# Patient Record
Sex: Male | Born: 1985 | Race: White | Hispanic: No | Marital: Single
Health system: Southern US, Academic
[De-identification: ages and names within clinical notes are randomized; demographics above are authoritative.]

## PROBLEM LIST (undated history)

## (undated) ENCOUNTER — Ambulatory Visit

## (undated) ENCOUNTER — Telehealth

## (undated) ENCOUNTER — Telehealth
Attending: Pharmacist Clinician (PhC)/ Clinical Pharmacy Specialist | Primary: Pharmacist Clinician (PhC)/ Clinical Pharmacy Specialist

## (undated) ENCOUNTER — Telehealth
Attending: Student in an Organized Health Care Education/Training Program | Primary: Student in an Organized Health Care Education/Training Program

## (undated) ENCOUNTER — Encounter

## (undated) ENCOUNTER — Encounter
Attending: Student in an Organized Health Care Education/Training Program | Primary: Student in an Organized Health Care Education/Training Program

## (undated) ENCOUNTER — Ambulatory Visit: Attending: Radiation Oncology | Primary: Radiation Oncology

## (undated) ENCOUNTER — Ambulatory Visit
Attending: Student in an Organized Health Care Education/Training Program | Primary: Student in an Organized Health Care Education/Training Program

## (undated) ENCOUNTER — Encounter
Attending: Pharmacist Clinician (PhC)/ Clinical Pharmacy Specialist | Primary: Pharmacist Clinician (PhC)/ Clinical Pharmacy Specialist

## (undated) ENCOUNTER — Encounter: Payer: PRIVATE HEALTH INSURANCE | Attending: Psychiatry | Primary: Psychiatry

## (undated) ENCOUNTER — Ambulatory Visit
Attending: Pharmacist Clinician (PhC)/ Clinical Pharmacy Specialist | Primary: Pharmacist Clinician (PhC)/ Clinical Pharmacy Specialist

## (undated) ENCOUNTER — Encounter: Attending: Psychiatry | Primary: Psychiatry

## (undated) ENCOUNTER — Encounter: Attending: Radiation Oncology | Primary: Radiation Oncology

## (undated) ENCOUNTER — Inpatient Hospital Stay

## (undated) ENCOUNTER — Non-Acute Institutional Stay: Payer: PRIVATE HEALTH INSURANCE

## (undated) ENCOUNTER — Telehealth: Attending: Internal Medicine | Primary: Internal Medicine

## (undated) ENCOUNTER — Encounter: Attending: Internal Medicine | Primary: Internal Medicine

## (undated) ENCOUNTER — Ambulatory Visit: Attending: Neurological Surgery | Primary: Neurological Surgery

## (undated) ENCOUNTER — Encounter
Payer: PRIVATE HEALTH INSURANCE | Attending: Student in an Organized Health Care Education/Training Program | Primary: Student in an Organized Health Care Education/Training Program

## (undated) ENCOUNTER — Ambulatory Visit: Payer: PRIVATE HEALTH INSURANCE | Attending: Clinical | Primary: Clinical

## (undated) ENCOUNTER — Encounter: Attending: Neuromusculoskeletal Medicine & OMM | Primary: Neuromusculoskeletal Medicine & OMM

## (undated) ENCOUNTER — Encounter: Payer: PRIVATE HEALTH INSURANCE | Attending: Critical Care Medicine | Primary: Critical Care Medicine

## (undated) ENCOUNTER — Ambulatory Visit: Payer: PRIVATE HEALTH INSURANCE | Attending: Critical Care Medicine | Primary: Critical Care Medicine

## (undated) ENCOUNTER — Non-Acute Institutional Stay
Payer: PRIVATE HEALTH INSURANCE | Attending: Pharmacist Clinician (PhC)/ Clinical Pharmacy Specialist | Primary: Pharmacist Clinician (PhC)/ Clinical Pharmacy Specialist

## (undated) ENCOUNTER — Encounter: Payer: PRIVATE HEALTH INSURANCE | Attending: Radiation Oncology | Primary: Radiation Oncology

## (undated) ENCOUNTER — Encounter
Payer: MEDICAID | Attending: Student in an Organized Health Care Education/Training Program | Primary: Student in an Organized Health Care Education/Training Program

## (undated) ENCOUNTER — Telehealth: Attending: Radiation Oncology | Primary: Radiation Oncology

## (undated) ENCOUNTER — Ambulatory Visit: Attending: Psychiatry | Primary: Psychiatry

## (undated) DIAGNOSIS — S3992XA Unspecified injury of lower back, initial encounter: Secondary | ICD-10-CM

## (undated) MED ORDER — OXYCODONE 5 MG CAPSULE: ORAL | 0.00000 days | PRN

## (undated) MED ORDER — DRONABINOL 10 MG CAPSULE: capsule | 0 refills | 0 days

---

## 2011-05-16 ENCOUNTER — Emergency Department (HOSPITAL_COMMUNITY)
Admission: EM | Admit: 2011-05-16 | Discharge: 2011-05-16 | Disposition: A | Discharge status: Home / Self Care | Payer: Self-pay | Attending: Emergency Medicine | Admitting: Emergency Medicine

## 2011-05-16 ENCOUNTER — Emergency Department (HOSPITAL_COMMUNITY): Payer: Self-pay

## 2011-05-16 DIAGNOSIS — S20229A Contusion of unspecified back wall of thorax, initial encounter: Secondary | ICD-10-CM | POA: Insufficient documentation

## 2011-05-16 DIAGNOSIS — Y9229 Other specified public building as the place of occurrence of the external cause: Secondary | ICD-10-CM | POA: Insufficient documentation

## 2011-05-16 DIAGNOSIS — M545 Low back pain, unspecified: Secondary | ICD-10-CM | POA: Insufficient documentation

## 2011-05-16 DIAGNOSIS — M546 Pain in thoracic spine: Secondary | ICD-10-CM | POA: Insufficient documentation

## 2011-05-16 DIAGNOSIS — IMO0002 Reserved for concepts with insufficient information to code with codable children: Secondary | ICD-10-CM | POA: Insufficient documentation

## 2011-05-16 DIAGNOSIS — J45909 Unspecified asthma, uncomplicated: Secondary | ICD-10-CM | POA: Insufficient documentation

## 2012-12-22 ENCOUNTER — Emergency Department (HOSPITAL_COMMUNITY)
Admission: EM | Admit: 2012-12-22 | Discharge: 2012-12-22 | Disposition: A | Discharge status: Home / Self Care | Payer: No Typology Code available for payment source | Attending: Emergency Medicine | Admitting: Emergency Medicine

## 2012-12-22 ENCOUNTER — Emergency Department (HOSPITAL_COMMUNITY): Payer: No Typology Code available for payment source

## 2012-12-22 ENCOUNTER — Encounter (HOSPITAL_COMMUNITY): Payer: Self-pay | Admitting: Adult Health

## 2012-12-22 DIAGNOSIS — Y939 Activity, unspecified: Secondary | ICD-10-CM | POA: Insufficient documentation

## 2012-12-22 DIAGNOSIS — Y92410 Unspecified street and highway as the place of occurrence of the external cause: Secondary | ICD-10-CM | POA: Insufficient documentation

## 2012-12-22 DIAGNOSIS — S199XXA Unspecified injury of neck, initial encounter: Secondary | ICD-10-CM | POA: Insufficient documentation

## 2012-12-22 DIAGNOSIS — IMO0002 Reserved for concepts with insufficient information to code with codable children: Secondary | ICD-10-CM | POA: Insufficient documentation

## 2012-12-22 DIAGNOSIS — F17200 Nicotine dependence, unspecified, uncomplicated: Secondary | ICD-10-CM | POA: Insufficient documentation

## 2012-12-22 DIAGNOSIS — M549 Dorsalgia, unspecified: Secondary | ICD-10-CM

## 2012-12-22 DIAGNOSIS — S0993XA Unspecified injury of face, initial encounter: Secondary | ICD-10-CM | POA: Insufficient documentation

## 2012-12-22 HISTORY — DX: Unspecified injury of lower back, initial encounter: S39.92XA

## 2012-12-22 MED ORDER — HYDROCODONE-ACETAMINOPHEN 5-325 MG PO TABS: 1.0000 | ORAL_TABLET | ORAL | Status: DC | PRN

## 2012-12-22 MED ORDER — OXYCODONE-ACETAMINOPHEN 5-325 MG PO TABS
1.0000 | ORAL_TABLET | Freq: Once | ORAL | Status: AC
  Administered 2012-12-22: 1 via ORAL
  Filled 2012-12-22: qty 1

## 2012-12-22 MED ORDER — CYCLOBENZAPRINE HCL 10 MG PO TABS: 10.0000 mg | ORAL_TABLET | Freq: Two times a day (BID) | ORAL | Status: DC | PRN
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
## 2012-12-22 NOTE — ED Provider Notes (Signed)
Medical screening examination/treatment/procedure(s) were performed by non-physician practitioner and as supervising physician I was immediately available for consultation/collaboration.   Zeda Gangwer, MD 12/22/12 2346 
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
## 2012-12-22 NOTE — ED Notes (Signed)
Patient transported to X-ray 

## 2012-12-22 NOTE — ED Notes (Signed)
Pt alert and oriented, with steady gait at time of discharge. Pt given discharge papers and papers explained. All questions answered and pt walked to discharge.  

## 2012-12-26 ENCOUNTER — Emergency Department (HOSPITAL_COMMUNITY): Payer: No Typology Code available for payment source

## 2012-12-26 ENCOUNTER — Encounter (HOSPITAL_COMMUNITY): Payer: Self-pay | Admitting: Family Medicine

## 2012-12-26 ENCOUNTER — Emergency Department (HOSPITAL_COMMUNITY)
Admission: EM | Admit: 2012-12-26 | Discharge: 2012-12-26 | Disposition: A | Discharge status: Home / Self Care | Payer: No Typology Code available for payment source | Attending: Emergency Medicine | Admitting: Emergency Medicine

## 2012-12-26 DIAGNOSIS — Y939 Activity, unspecified: Secondary | ICD-10-CM | POA: Insufficient documentation

## 2012-12-26 DIAGNOSIS — S0003XA Contusion of scalp, initial encounter: Secondary | ICD-10-CM | POA: Insufficient documentation

## 2012-12-26 DIAGNOSIS — S8000XA Contusion of unspecified knee, initial encounter: Secondary | ICD-10-CM | POA: Insufficient documentation

## 2012-12-26 DIAGNOSIS — M549 Dorsalgia, unspecified: Secondary | ICD-10-CM

## 2012-12-26 DIAGNOSIS — S0093XA Contusion of unspecified part of head, initial encounter: Secondary | ICD-10-CM

## 2012-12-26 DIAGNOSIS — Y92410 Unspecified street and highway as the place of occurrence of the external cause: Secondary | ICD-10-CM | POA: Insufficient documentation

## 2012-12-26 DIAGNOSIS — S0083XA Contusion of other part of head, initial encounter: Secondary | ICD-10-CM | POA: Insufficient documentation

## 2012-12-26 DIAGNOSIS — F17200 Nicotine dependence, unspecified, uncomplicated: Secondary | ICD-10-CM | POA: Insufficient documentation

## 2012-12-26 DIAGNOSIS — R51 Headache: Secondary | ICD-10-CM

## 2012-12-26 DIAGNOSIS — Z87828 Personal history of other (healed) physical injury and trauma: Secondary | ICD-10-CM | POA: Insufficient documentation

## 2012-12-26 MED ORDER — HYDROCODONE-ACETAMINOPHEN 5-325 MG PO TABS: 2.0000 | ORAL_TABLET | Freq: Four times a day (QID) | ORAL | Status: DC | PRN

## 2012-12-26 MED ORDER — IBUPROFEN 600 MG PO TABS: 600.0000 mg | ORAL_TABLET | Freq: Three times a day (TID) | ORAL | Status: DC | PRN

## 2012-12-26 MED ORDER — IBUPROFEN 400 MG PO TABS
600.0000 mg | ORAL_TABLET | Freq: Once | ORAL | Status: AC
  Administered 2012-12-26: 600 mg via ORAL
  Filled 2012-12-26: qty 1

## 2012-12-26 MED ORDER — OXYCODONE-ACETAMINOPHEN 5-325 MG PO TABS
1.0000 | ORAL_TABLET | Freq: Once | ORAL | Status: AC
  Administered 2012-12-26: 1 via ORAL
  Filled 2012-12-26: qty 1
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
## 2012-12-26 NOTE — ED Notes (Signed)
Pt states he has been taking ibuprofen, vicodin, and flexeril for pain but has had no relief.
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
## 2013-07-31 ENCOUNTER — Emergency Department (HOSPITAL_COMMUNITY)
Admission: EM | Admit: 2013-07-31 | Discharge: 2013-07-31 | Disposition: A | Discharge status: Home / Self Care | Payer: Self-pay | Attending: Emergency Medicine | Admitting: Emergency Medicine

## 2013-07-31 ENCOUNTER — Encounter (HOSPITAL_COMMUNITY): Payer: Self-pay | Admitting: Nurse Practitioner

## 2013-07-31 DIAGNOSIS — Z79899 Other long term (current) drug therapy: Secondary | ICD-10-CM | POA: Insufficient documentation

## 2013-07-31 DIAGNOSIS — Z8739 Personal history of other diseases of the musculoskeletal system and connective tissue: Secondary | ICD-10-CM | POA: Insufficient documentation

## 2013-07-31 DIAGNOSIS — J039 Acute tonsillitis, unspecified: Secondary | ICD-10-CM | POA: Insufficient documentation

## 2013-07-31 DIAGNOSIS — R599 Enlarged lymph nodes, unspecified: Secondary | ICD-10-CM | POA: Insufficient documentation

## 2013-07-31 DIAGNOSIS — Z87891 Personal history of nicotine dependence: Secondary | ICD-10-CM | POA: Insufficient documentation

## 2013-07-31 LAB — RAPID STREP SCREEN (MED CTR MEBANE ONLY): Streptococcus, Group A Screen (Direct): NEGATIVE

## 2013-07-31 LAB — MONONUCLEOSIS SCREEN: Mono Screen: NEGATIVE

## 2013-07-31 MED ORDER — ACETAMINOPHEN-CODEINE 120-12 MG/5ML PO SOLN
10.0000 mL | Freq: Once | ORAL | Status: AC
Start: 2013-07-31 — End: 2013-07-31
  Administered 2013-07-31: 10 mL via ORAL
  Filled 2013-07-31: qty 10

## 2013-07-31 MED ORDER — ACETAMINOPHEN-CODEINE 120-12 MG/5ML PO SOLN: 10.0000 mL | Freq: Once | ORAL | Status: AC

## 2013-07-31 MED ORDER — PENICILLIN G BENZATHINE 1200000 UNIT/2ML IM SUSP
1.2000 10*6.[IU] | Freq: Once | INTRAMUSCULAR | Status: AC
  Administered 2013-07-31: 1.2 10*6.[IU] via INTRAMUSCULAR
  Filled 2013-07-31: qty 2

## 2013-07-31 MED ORDER — IBUPROFEN 400 MG PO TABS
800.0000 mg | ORAL_TABLET | Freq: Once | ORAL | Status: AC
  Administered 2013-07-31: 800 mg via ORAL
  Filled 2013-07-31: qty 2

## 2013-07-31 NOTE — ED Notes (Signed)
Notified PA of vital signs.

## 2013-07-31 NOTE — ED Provider Notes (Signed)
  CSN: [id number]     Arrival date & time 07/31/13  1116 History     First MD Initiated Contact with Patient 07/31/13 1120     Chief Complaint  Patient presents with  . Sore Throat   (Consider location/radiation/quality/duration/timing/severity/associated sxs/prior Treatment) HPI Patient presents to the emergency department with a two-day history of sore throat.  Patient, states, that he noticed, sore throat, 2 days, ago, with redness, swelling, and plaques in his throat.  Patient, states he is also having lymph node, pain and swelling in his neck.  Patient denies chest pain, shortness of breath, difficulty breathing, difficulty swallowing, headache, blurred vision, numbness, weakness, dizziness, syncope, fever, or blurred vision.  Patient, states, that he has had sweats, but no documented fevers.  Patient, states he did not take any medications prior to arrival.  Patient, states swallowing, makes his pain, worse Past Medical History  Diagnosis Date  . Back injury    History reviewed. No pertinent past surgical history. History reviewed. No pertinent family history. History  Substance Use Topics  . Smoking status: Former Games developer  . Smokeless tobacco: Not on file  . Alcohol Use: No    Review of Systems All other systems negative except as documented in the HPI. All pertinent positives and negatives as reviewed in the HPI. Allergies  Review of patient's allergies indicates no known allergies.  Home Medications   Current Outpatient Rx  Name  Route  Sig  Dispense  Refill  . cyclobenzaprine (FLEXERIL) 10 MG tablet   Oral   Take 1 tablet (10 mg total) by mouth 2 (two) times daily as needed for muscle spasms.   20 tablet   0   . HYDROcodone-acetaminophen (NORCO/VICODIN) 5-325 MG per tablet   Oral   Take 1-2 tablets by mouth every 4 (four) hours as needed for pain.   12 tablet   0   . HYDROcodone-acetaminophen (NORCO/VICODIN) 5-325 MG per tablet   Oral   Take 2 tablets by mouth  every 6 (six) hours as needed for pain.   15 tablet   0   . ibuprofen (ADVIL,MOTRIN) 600 MG tablet   Oral   Take 1 tablet (600 mg total) by mouth every 8 (eight) hours as needed for pain. Take with food.   20 tablet   0    BP 138/86  Pulse 108  Temp(Src) 97.8 F (36.6 C) (Oral)  Resp 16  SpO2 95% Physical Exam  Nursing note and vitals reviewed. Constitutional: He appears well-developed and well-nourished. No distress.  HENT:  Head: Normocephalic and atraumatic.  Mouth/Throat: Mucous membranes are normal. No edematous. Oropharyngeal exudate, posterior oropharyngeal edema and posterior oropharyngeal erythema present. No tonsillar abscesses.  Eyes: Pupils are equal, round, and reactive to light.  Cardiovascular: Normal rate, regular rhythm and normal heart sounds.  Exam reveals no gallop and no friction rub.   No murmur heard. Pulmonary/Chest: Effort normal and breath sounds normal. No respiratory distress.    ED Course   Procedures (including critical care time)  Labs Reviewed  RAPID STREP SCREEN   patient has a negative mono, and negative, strep, we'll treat for an infectious process based on the appearance of his throat.  MDM    Carlyle Dolly, PA-C 07/31/13 1323

## 2013-07-31 NOTE — ED Notes (Signed)
Pt with sore throat, swelling, redness and pus to back of throat x 2 days. Breathing easily

## 2013-08-01 ENCOUNTER — Telehealth (HOSPITAL_COMMUNITY): Payer: Self-pay | Admitting: Emergency Medicine

## 2013-08-01 NOTE — ED Provider Notes (Signed)
Medical screening examination/treatment/procedure(s) were performed by non-physician practitioner and as supervising physician I was immediately available for consultation/collaboration.   Kahmari Herard E Decker Cogdell, MD 08/01/13 0916 

## 2013-08-02 ENCOUNTER — Telehealth (HOSPITAL_COMMUNITY): Payer: Self-pay | Admitting: *Deleted

## 2013-08-02 LAB — CULTURE, GROUP A STREP

## 2013-08-03 NOTE — ED Notes (Signed)
Adequately treated-per Emily West 

## 2015-01-27 ENCOUNTER — Emergency Department (HOSPITAL_COMMUNITY)
Admission: EM | Admit: 2015-01-27 | Discharge: 2015-01-27 | Disposition: A | Discharge status: Home / Self Care | Payer: Self-pay | Attending: Emergency Medicine | Admitting: Emergency Medicine

## 2015-01-27 ENCOUNTER — Emergency Department (HOSPITAL_COMMUNITY): Payer: Self-pay

## 2015-01-27 ENCOUNTER — Encounter (HOSPITAL_COMMUNITY): Payer: Self-pay | Admitting: Emergency Medicine

## 2015-01-27 DIAGNOSIS — R229 Localized swelling, mass and lump, unspecified: Secondary | ICD-10-CM

## 2015-01-27 DIAGNOSIS — IMO0002 Reserved for concepts with insufficient information to code with codable children: Secondary | ICD-10-CM

## 2015-01-27 DIAGNOSIS — I861 Scrotal varices: Secondary | ICD-10-CM | POA: Insufficient documentation

## 2015-01-27 DIAGNOSIS — N503 Cyst of epididymis: Secondary | ICD-10-CM | POA: Insufficient documentation

## 2015-01-27 DIAGNOSIS — N508 Other specified disorders of male genital organs: Secondary | ICD-10-CM | POA: Insufficient documentation

## 2015-01-27 DIAGNOSIS — Z87891 Personal history of nicotine dependence: Secondary | ICD-10-CM | POA: Insufficient documentation

## 2015-01-27 LAB — URINALYSIS, ROUTINE W REFLEX MICROSCOPIC
BILIRUBIN URINE: NEGATIVE
Glucose, UA: NEGATIVE mg/dL
HGB URINE DIPSTICK: NEGATIVE
KETONES UR: NEGATIVE mg/dL
Nitrite: NEGATIVE
PROTEIN: NEGATIVE mg/dL
Specific Gravity, Urine: 1.014 (ref 1.005–1.030)
UROBILINOGEN UA: 1 mg/dL (ref 0.0–1.0)
pH: 6.5 (ref 5.0–8.0)

## 2015-01-27 LAB — URINE MICROSCOPIC-ADD ON

## 2015-01-27 MED ORDER — DOXYCYCLINE HYCLATE 100 MG PO CAPS: 100.0000 mg | ORAL_CAPSULE | Freq: Two times a day (BID) | ORAL | Status: AC

## 2015-01-27 NOTE — ED Notes (Signed)
Pt noted swollen nodule in l/testicle last night. Denies pain

## 2015-01-27 NOTE — ED Provider Notes (Signed)
CSN: [id number]     Arrival date & time 01/27/15  1008 History   First MD Initiated Contact with Patient 01/27/15 1030     Chief Complaint  Patient presents with  . Groin Swelling    1 day hx of nonpainful growth on r/testicle     (Consider location/radiation/quality/duration/timing/severity/associated sxs/prior Treatment) Patient is a 29 y.o. male presenting with testicular pain. The history is provided by the patient. No language interpreter was used.  Testicle Pain This is a new problem. The current episode started today. The problem occurs constantly. The problem has been gradually worsening. Associated symptoms include nausea. Pertinent negatives include no fever. Nothing aggravates the symptoms. He has tried nothing for the symptoms. The treatment provided moderate relief.  Pt reports he felt a knot in his right testicle.  Pt reports he noticed when he was in the shower  Past Medical History  Diagnosis Date  . Back injury    History reviewed. No pertinent past surgical history. History reviewed. No pertinent family history. History  Substance Use Topics  . Smoking status: Former Games developer  . Smokeless tobacco: Not on file  . Alcohol Use: Yes    Review of Systems  Constitutional: Negative for fever.  Gastrointestinal: Positive for nausea.  Genitourinary: Positive for testicular pain.  All other systems reviewed and are negative.     Allergies  Review of patient's allergies indicates no known allergies.  Home Medications   Prior to Admission medications   Medication Sig Start Date End Date Taking? Authorizing Provider  ibuprofen (ADVIL,MOTRIN) 200 MG tablet Take 600-800 mg by mouth daily as needed for pain.   Yes Historical Provider, MD  acetaminophen-codeine 120-12 MG/5ML solution Take 10 mLs by mouth once. Patient not taking: Reported on 01/27/2015 07/31/13   Jamesetta Orleans Lawyer, PA-C   BP 146/90 mmHg  Pulse 93  Temp(Src) 98.2 F (36.8 C) (Oral)  Resp 16  SpO2  100% Physical Exam  Constitutional: He is oriented to person, place, and time. He appears well-developed and well-nourished.  HENT:  Head: Normocephalic and atraumatic.  Eyes: EOM are normal. Pupils are equal, round, and reactive to light.  Neck: Normal range of motion.  Cardiovascular: Normal rate.   Pulmonary/Chest: Effort normal.  Abdominal: Soft. He exhibits no distension.  Musculoskeletal: Normal range of motion.  Neurological: He is alert and oriented to person, place, and time.  Skin: Skin is warm.  Psychiatric: He has a normal mood and affect.  Nursing note and vitals reviewed.   ED Course  Procedures (including critical care time) Labs Review Labs Reviewed  URINALYSIS, ROUTINE W REFLEX MICROSCOPIC - Abnormal; Notable for the following:    Leukocytes, UA TRACE (*)    All other components within normal limits  URINE MICROSCOPIC-ADD ON    Imaging Review US Scrotum  01/27/2015   CLINICAL DATA:  Right scrotal/testicular region pain. Questionable right scrotal lump.  EXAM: SCROTAL ULTRASOUND  DOPPLER ULTRASOUND OF THE TESTICLES  TECHNIQUE: Complete ultrasound examination of the testicles, epididymis, and other scrotal structures was performed. Color and spectral Doppler ultrasound were also utilized to evaluate blood flow to the testicles.  COMPARISON:  None.  FINDINGS: Right testicle  Measurements: 4.8 cm x 2.2 cm x 3.3 cm. No mass or microlithiasis visualized.  Left testicle  Measurements: 5.3 cm x 2.1 cm x 3.0 cm. No mass or microlithiasis visualized.  Right epididymis:  7 mm epididymal head cyst.  No other abnormality.  Left epididymis: Several small epididymal cysts. No other abnormalities.  Hydrocele:  No significant hydrocele.  Varicocele:  Positive left varicocele.  Pulsed Doppler interrogation of both testes demonstrates low resistance arterial and venous waveforms bilaterally.  IMPRESSION: 1. Normal testicles.  Specifically, no mass or portion. 2. Small epididymal cysts. 3.  Left-sided varicocele. 4. No other abnormalities. No specific abnormality is seen to correspond to the reported palpable abnormality.   Electronically Signed   By: Amie Portlandavid  Ormond M.D.   On: 01/27/2015 12:43   [place] Art/ven Flow Abd Pelv Doppler  01/27/2015   CLINICAL DATA:  Right scrotal/testicular region pain. Questionable right scrotal lump.  EXAM: SCROTAL ULTRASOUND  DOPPLER ULTRASOUND OF THE TESTICLES  TECHNIQUE: Complete ultrasound examination of the testicles, epididymis, and other scrotal structures was performed. Color and spectral Doppler ultrasound were also utilized to evaluate blood flow to the testicles.  COMPARISON:  None.  FINDINGS: Right testicle  Measurements: 4.8 cm x 2.2 cm x 3.3 cm. No mass or microlithiasis visualized.  Left testicle  Measurements: 5.3 cm x 2.1 cm x 3.0 cm. No mass or microlithiasis visualized.  Right epididymis:  7 mm epididymal head cyst.  No other abnormality.  Left epididymis: Several small epididymal cysts. No other abnormalities.  Hydrocele:  No significant hydrocele.  Varicocele:  Positive left varicocele.  Pulsed Doppler interrogation of both testes demonstrates low resistance arterial and venous waveforms bilaterally.  IMPRESSION: 1. Normal testicles.  Specifically, no mass or portion. 2. Small epididymal cysts. 3. Left-sided varicocele. 4. No other abnormalities. No specific abnormality is seen to correspond to the reported palpable abnormality.   Electronically Signed   By: Amie Portlandavid  Ormond M.D.   On: 01/27/2015 12:43     EKG Interpretation None      MDM  Ultrasound scrotum 7 mm cyst. Left varicocele  I advised pt of results   Final diagnoses:  Mass  Left varicocele  Epididymal cyst    Pt counseled on follow up.       Lonia SkinnerLeslie K SummertownSofia, PA-C 01/27/15 1324  Doug SouSam Jacubowitz, MD 01/27/15 [phone]

## 2015-01-27 NOTE — Discharge Instructions (Signed)
Epidermal Cyst An epidermal cyst is sometimes called a sebaceous cyst, epidermal inclusion cyst, or infundibular cyst. These cysts usually contain a substance that looks "pasty" or "cheesy" and may have a bad smell. This substance is a protein called keratin. Epidermal cysts are usually found on the face, neck, or trunk. They may also occur in the vaginal area or other parts of the genitalia of both men and women. Epidermal cysts are usually small, painless, slow-growing bumps or lumps that move freely under the skin. It is important not to try to pop them. This may cause an infection and lead to tenderness and swelling. CAUSES  Epidermal cysts may be caused by a deep penetrating injury to the skin or a plugged hair follicle, often associated with acne. SYMPTOMS  Epidermal cysts can become inflamed and cause:  Redness.  Tenderness.  Increased temperature of the skin over the bumps or lumps.  Grayish-white, bad smelling material that drains from the bump or lump. DIAGNOSIS  Epidermal cysts are easily diagnosed by your caregiver during an exam. Rarely, a tissue sample (biopsy) may be taken to rule out other conditions that may resemble epidermal cysts. TREATMENT   Epidermal cysts often get better and disappear on their own. They are rarely ever cancerous.  If a cyst becomes infected, it may become inflamed and tender. This may require opening and draining the cyst. Treatment with antibiotics may be necessary. When the infection is gone, the cyst may be removed with minor surgery.  Small, inflamed cysts can often be treated with antibiotics or by injecting steroid medicines.  Sometimes, epidermal cysts become large and bothersome. If this happens, surgical removal in your caregiver's office may be necessary. HOME CARE INSTRUCTIONS  Only take over-the-counter or prescription medicines as directed by your caregiver.  Take your antibiotics as directed. Finish them even if you start to feel  better. SEEK MEDICAL CARE IF:   Your cyst becomes tender, red, or swollen.  Your condition is not improving or is getting worse.  You have any other questions or concerns. MAKE SURE YOU:  Understand these instructions.  Will watch your condition.  Will get help right away if you are not doing well or get worse. Document Released: 11/15/2004 Document Revised: 03/08/2012 Document Reviewed: 06/23/2011 [facility] Patient Information 2015 [place], [place]. This information is not intended to replace advice given to you by your health care provider. Make sure you discuss any questions you have with your health care provider. Varicocele A varicocele is a swelling of veins in the scrotum (the bag of skin that contains the testicles). It is most common in young men. It occurs most often on the left side. Small or painless varicoceles do not need treatment. Most often, this is not a serious problem, but further tests may be needed to confirm the diagnosis. Surgery may be needed if complications of varicoceles arise. Rarely, varicoceles can reoccur after surgery. CAUSES  The swelling is due to blood backing up in the vein that leads from the testicle back to the body. Blood backs up because the valves inside the vein are not working properly. Veins normally return blood to the heart. Valves in veins are supposed to be one-way valves. They should not allow blood to flow backwards. If the valves do not work well, blood can pool in a vein and make it swell. The same thing happens with varicose veins in the leg. SYMPTOMS  A varicocele most often causes no symptoms. When they occur, symptoms include:  Swelling on one side of the scrotum.  Swelling that is more obvious when standing up.  A lumpy feeling in the scrotum.  Heaviness on one side of the scrotum.  Dull ache in the scrotum, especially after exercise or prolonged standing or sitting.  Slower growth or reduced size of the testicle on the side  of the varicocele (in young males).  Problems with fertility can arise if the testicle does not grow normally. DIAGNOSIS  Varicocele is usually diagnosed by a physical exam. Sometimes ultrasonography is done. TREATMENT  Usually, varicoceles need no treatment. They are often routinely monitored on exam by your caregiver to ensure they do not slow the growth of the testicle on that side. Treatment may be needed if:  The varicocele is large.  There is a lot of pain.  The varicocele causes a decrease in the size of the testicle in a growing adolescent.  The other testicle is absent or not normal.  Varicoceles are found on both sides of the scrotum.  There is pain when exercising.  There are fertility problems. There are two types of treatment:  Surgery. The surgeon ties off the swollen veins. Surgery may be done with an incision in the skin or through a laparoscope. The surgery is usually done in an outpatient setting. Outpatient means there is no overnight stay in a hospital.  Embolization. A small tube is placed in a vein and guided into the swollen veins. X-rays are used to guide the small tube. Tiny metal coils or other blocking items are put through the tube. This blocks swollen veins and the flow of blood. This is usually done in an outpatient setting without the use of general anesthesia. HOME CARE INSTRUCTIONS  To decrease discomfort:  Wear supportive underwear.  Use an athletic supporter for sports.  Only take over-the-counter or prescription medicines for pain or discomfort as directed by your caregiver. SEEK MEDICAL CARE IF:   Pain is increasing.  Swelling does not decrease when lying down.  Testicle is smaller.  The testicle becomes enlarged, swollen, red, or painful. Document Released: 03/23/2001 Document Revised: 03/08/2012 Document Reviewed: 03/27/2010 [facility] Patient Information 2015 [place], [place]. This information is not intended to replace advice given to  you by your health care provider. Make sure you discuss any questions you have with your health care provider. Epididymitis Epididymitis is a swelling (inflammation) of the epididymis. The epididymis is a cord-like structure along the back part of the testicle. Epididymitis is usually, but not always, caused by infection. This is usually a sudden problem beginning with chills, fever and pain behind the scrotum and in the testicle. There may be swelling and redness of the testicle. DIAGNOSIS  Physical examination will reveal a tender, swollen epididymis. Sometimes, cultures are obtained from the urine or from prostate secretions to help find out if there is an infection or if the cause is a different problem. Sometimes, blood work is performed to see if your white blood cell count is elevated and if a germ (bacterial) or viral infection is present. Using this knowledge, an appropriate medicine which kills germs (antibiotic) can be chosen by your caregiver. A viral infection causing epididymitis will most often go away (resolve) without treatment. HOME CARE INSTRUCTIONS   Hot sitz baths for 20 minutes, 4 times per day, may help relieve pain.  Only take over-the-counter or prescription medicines for pain, discomfort or fever as directed by your caregiver.  Take all medicines, including antibiotics, as directed. Take the antibiotics for  the full prescribed length of time even if you are feeling better.  It is very important to keep all follow-up appointments. SEEK IMMEDIATE MEDICAL CARE IF:   You have a fever.  You have pain not relieved with medicines.  You have any worsening of your problems.  Your pain seems to come and go.  You develop pain, redness, and swelling in the scrotum and surrounding areas. MAKE SURE YOU:   Understand these instructions.  Will watch your condition.  Will get help right away if you are not doing well or get worse. Document Released: 12/12/2000 Document  Revised: 03/08/2012 Document Reviewed: 11/01/2009 [facility] Patient Information 2015 [place], [place]. This information is not intended to replace advice given to you by your health care provider. Make sure you discuss any questions you have with your health care provider.

## 2015-05-16 IMAGING — US US ART/VEN ABD/PELV/SCROTUM DOPPLER LTD
1 series · 14 of 25 positions shown · non-contrast
Comparison: None.

CLINICAL DATA: Right scrotal/testicular region pain. Questionable
right scrotal lump.

EXAM:
SCROTAL ULTRASOUND
DOPPLER ULTRASOUND OF THE TESTICLES
TECHNIQUE: Complete ultrasound examination of the testicles, epididymis, and
other scrotal structures was performed. Color and spectral Doppler
ultrasound were also utilized to evaluate blood flow to the
testicles.

[Series 1: us art/ven abd/pelv/scrotum doppler ltd · 0.06mm/px · 14 of 75 slices shown]
[im 1/75]
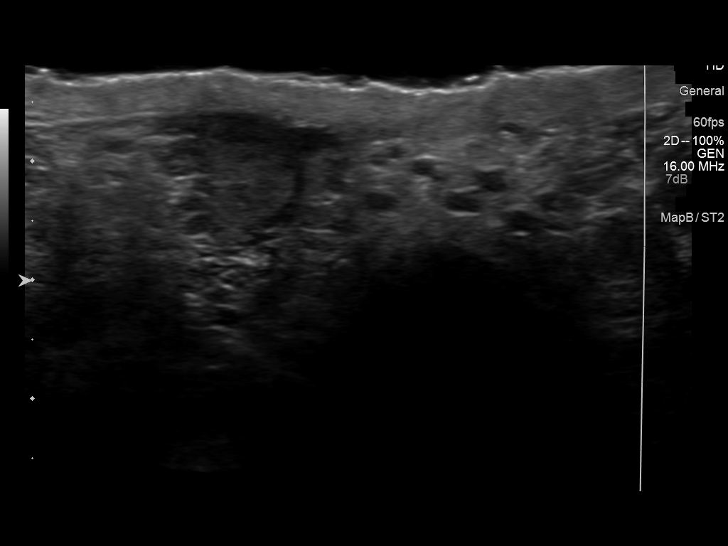
[im 7/75]
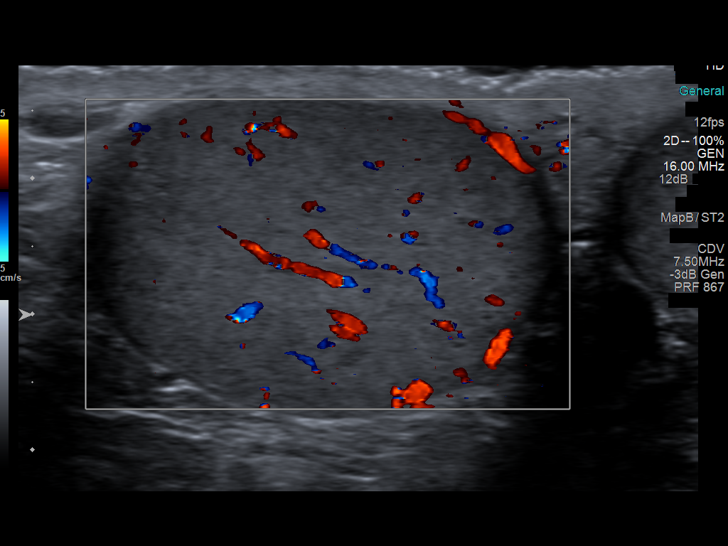
[im 13/75]
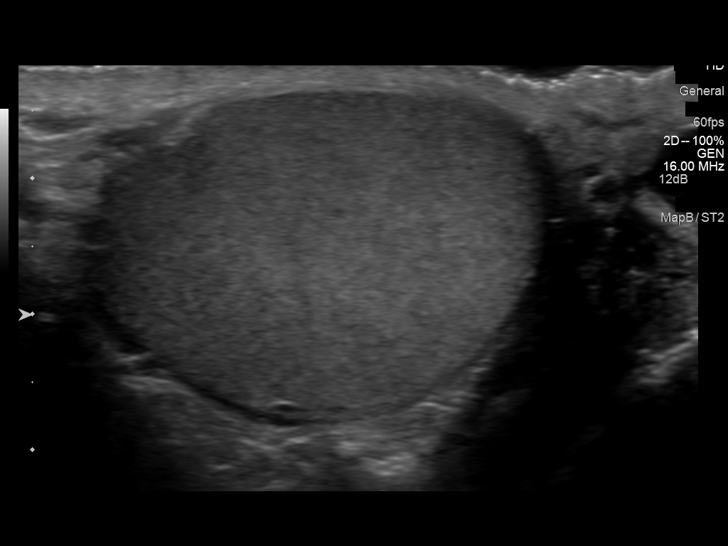
[im 19/75]
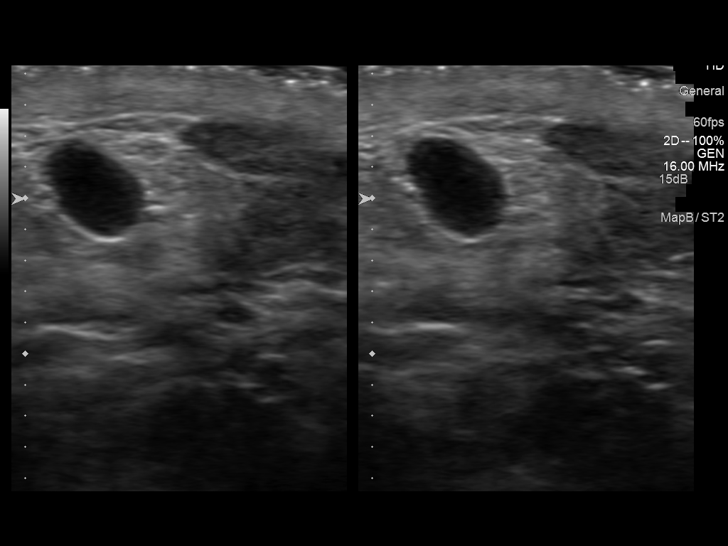
[im 25/75]
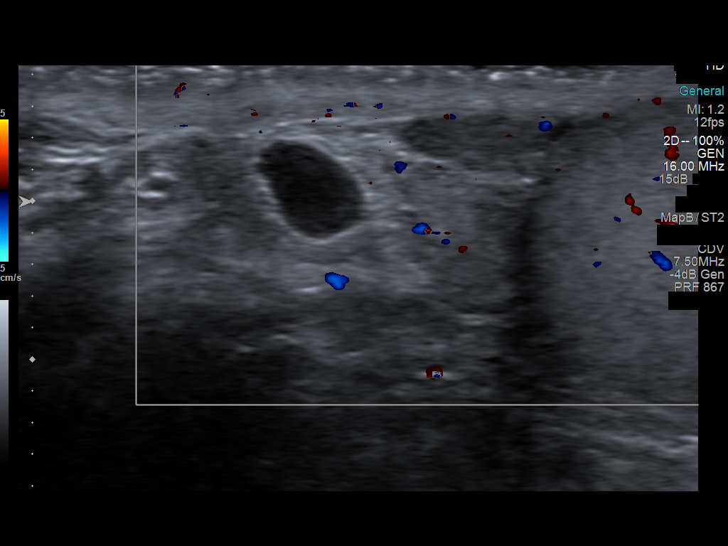
[im 28/75]
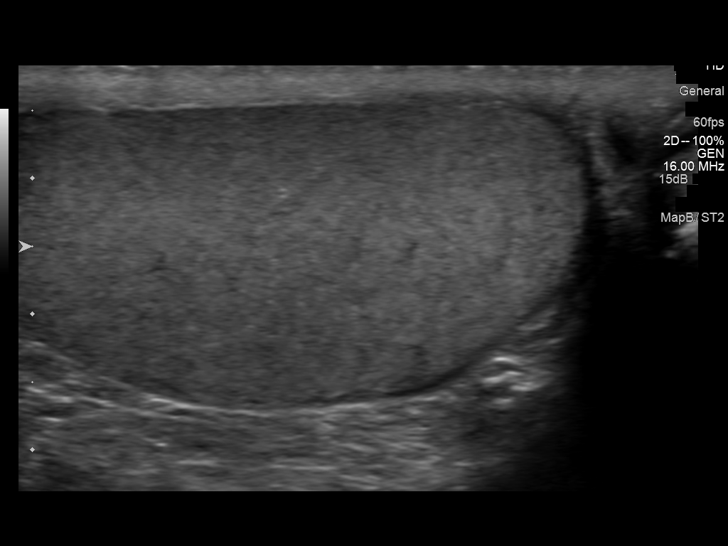
[im 34/75]
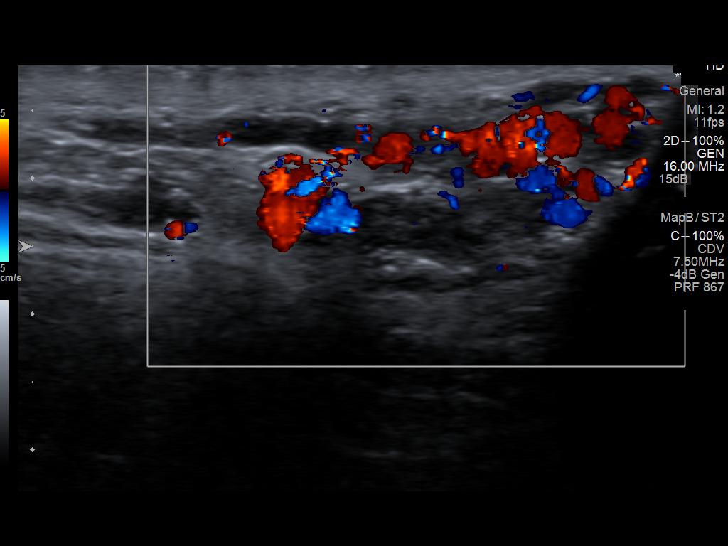
[im 41/75]
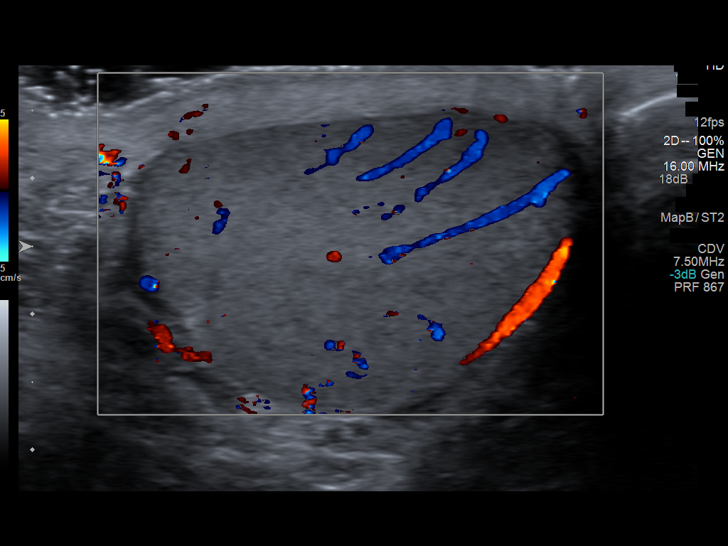
[im 47/75]
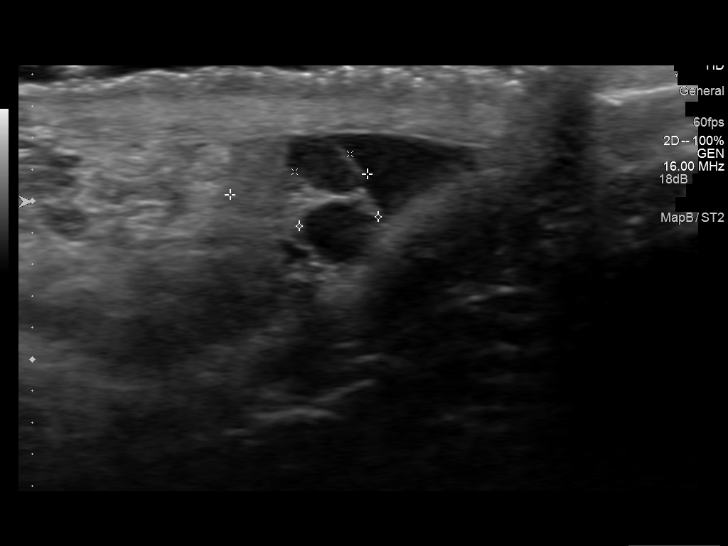
[im 50/75]
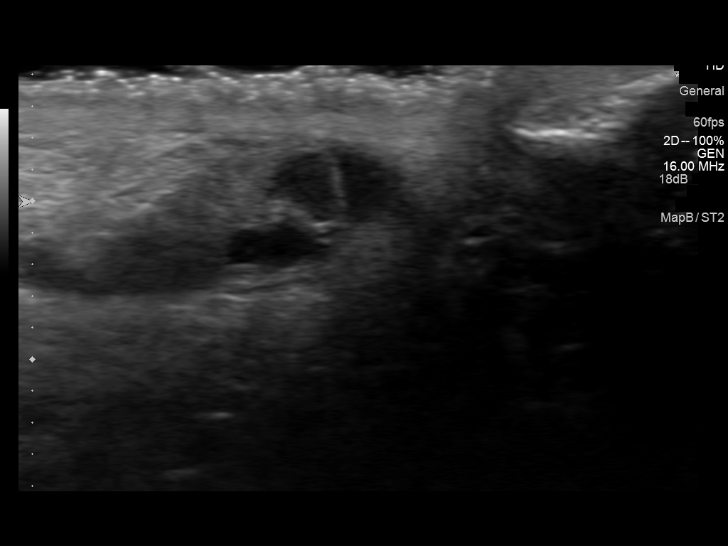
[im 56/75]
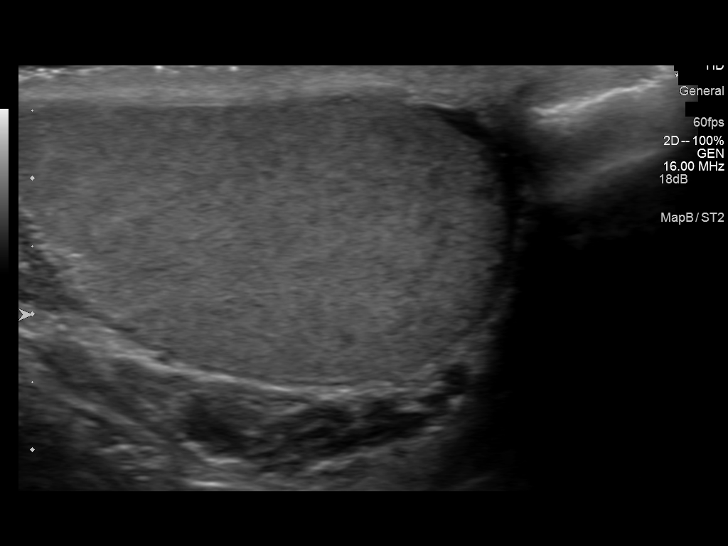
[im 62/75]
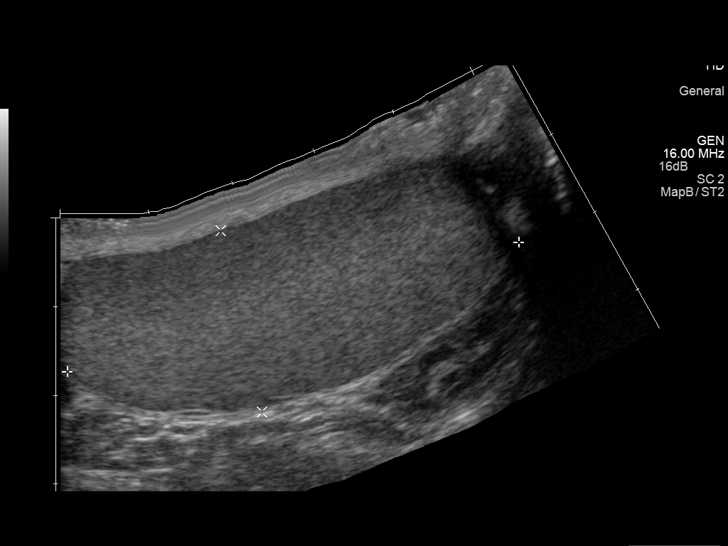
[im 68/75]
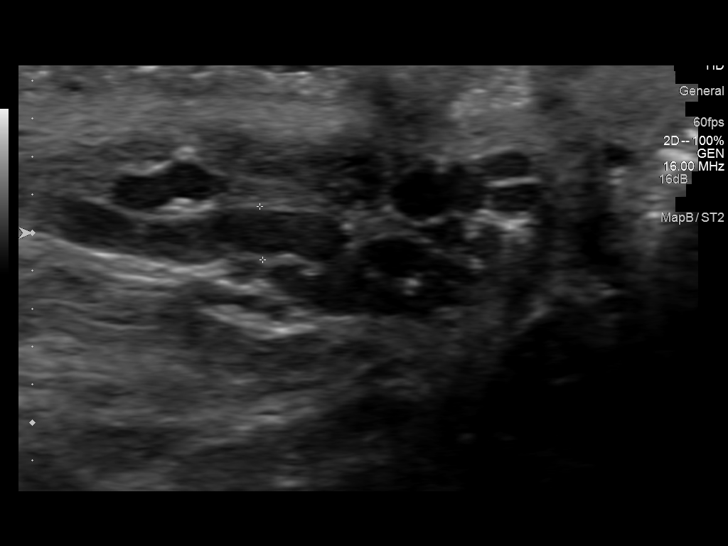
[im 75/75]
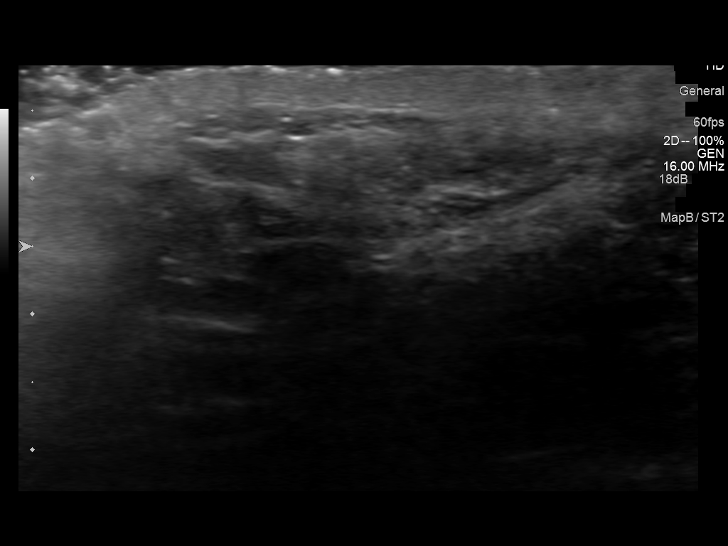

[14 of 25 positions shown; findings below may reference images not displayed]

FINDINGS: Right testicle

Measurements: 4.8 cm x 2.2 cm x 3.3 cm. No mass or microlithiasis
visualized.

Left testicle

Measurements: 5.3 cm x 2.1 cm x 3.0 cm. No mass or microlithiasis
visualized.

Right epididymis:  7 mm epididymal head cyst.  No other abnormality.

Left epididymis: Several small epididymal cysts. No other
abnormalities.

Hydrocele:  No significant hydrocele.

Varicocele:  Positive left varicocele.

Pulsed Doppler interrogation of both testes demonstrates low
resistance arterial and venous waveforms bilaterally.
IMPRESSION: 1. Normal testicles.  Specifically, no mass or portion.
2. Small epididymal cysts.
3. Left-sided varicocele.
4. No other abnormalities. No specific abnormality is seen to
correspond to the reported palpable abnormality.

## 2015-05-16 IMAGING — US US ART/VEN ABD/PELV/SCROTUM DOPPLER LTD
1 series · 8 of 8 positions shown · non-contrast
Comparison: None.

CLINICAL DATA: Right scrotal/testicular region pain. Questionable
right scrotal lump.

EXAM:
SCROTAL ULTRASOUND
DOPPLER ULTRASOUND OF THE TESTICLES
TECHNIQUE: Complete ultrasound examination of the testicles, epididymis, and
other scrotal structures was performed. Color and spectral Doppler
ultrasound were also utilized to evaluate blood flow to the
testicles.

[Series 1: us art/ven abd/pelv/scrotum doppler ltd · 0.04mm/px · 8 of 8 slices shown]
[im 1/8]
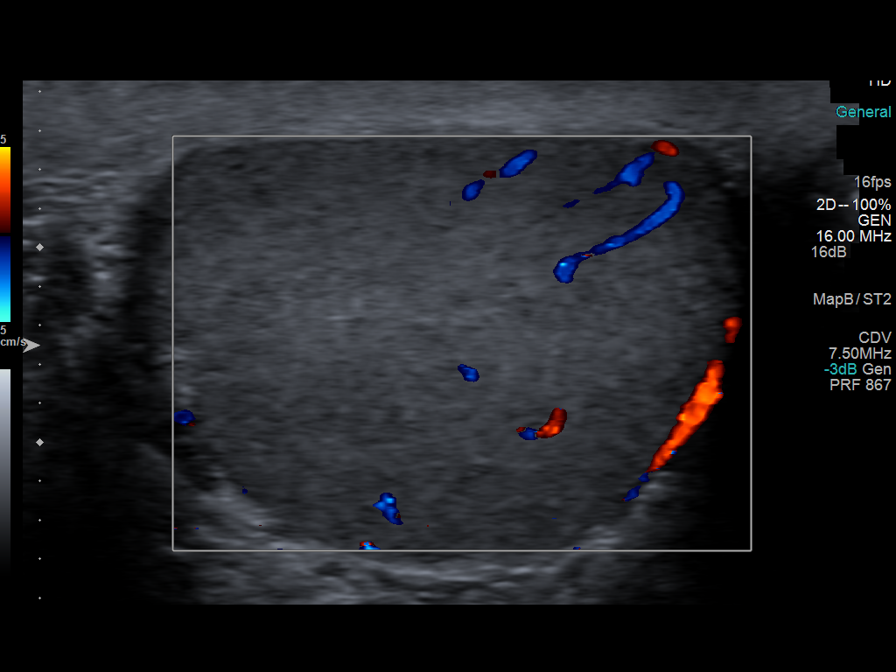
[im 2/8]
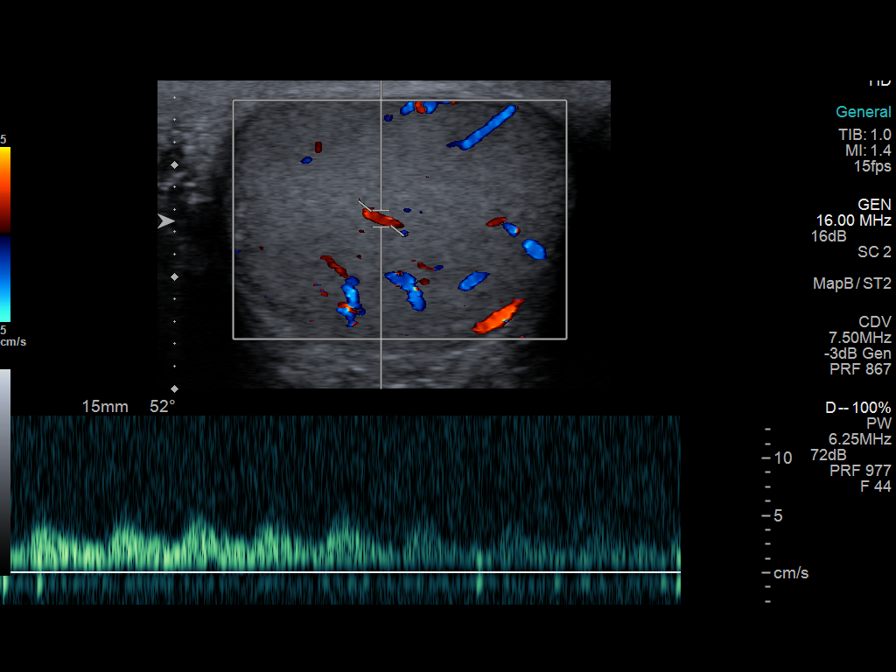
[im 3/8]
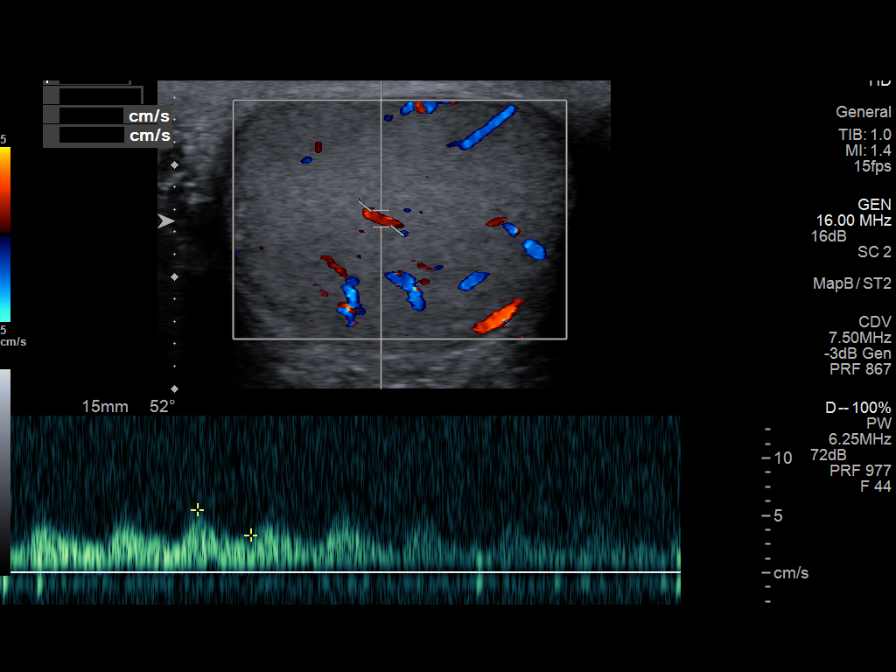
[im 4/8]
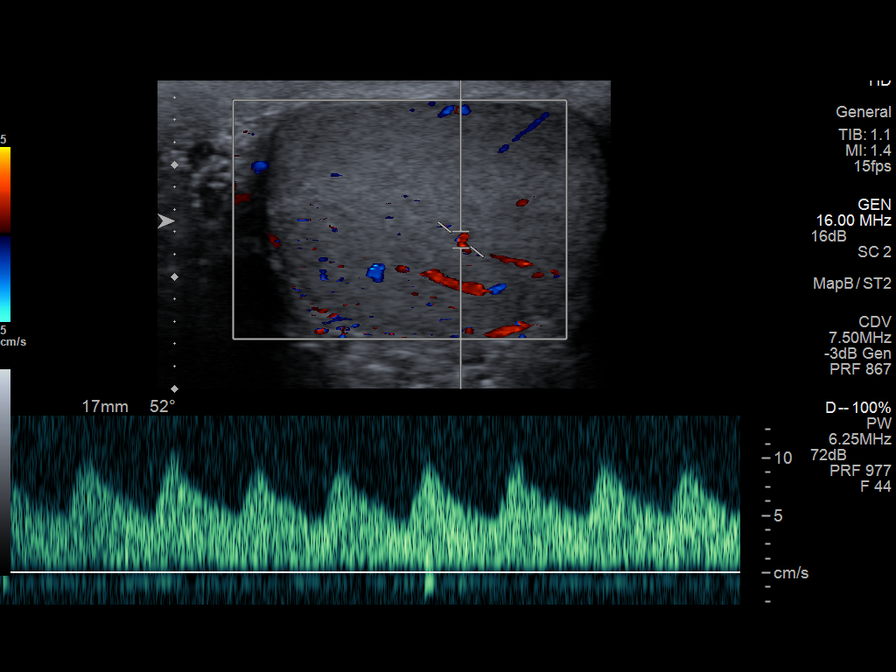
[im 5/8]
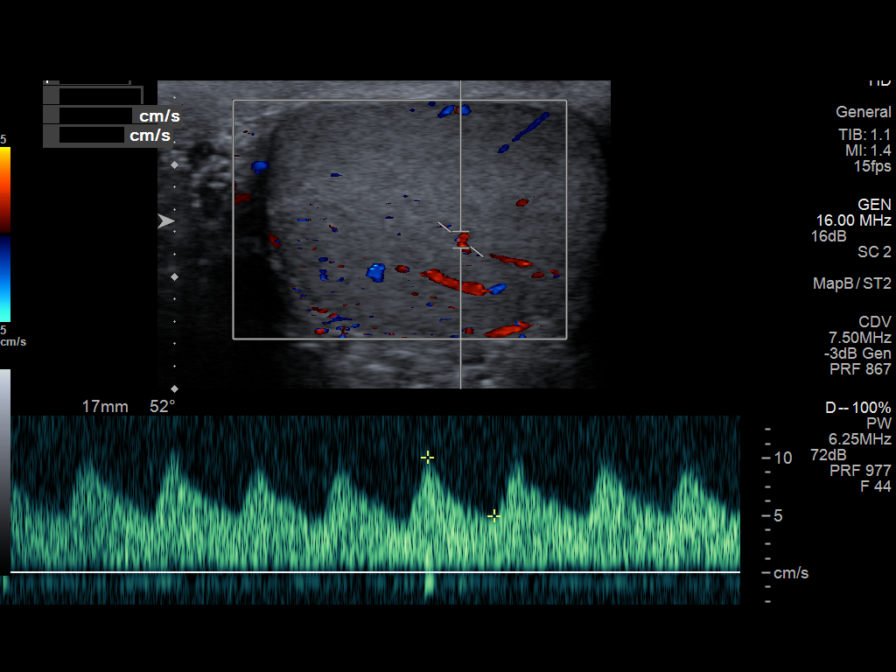
[im 6/8]
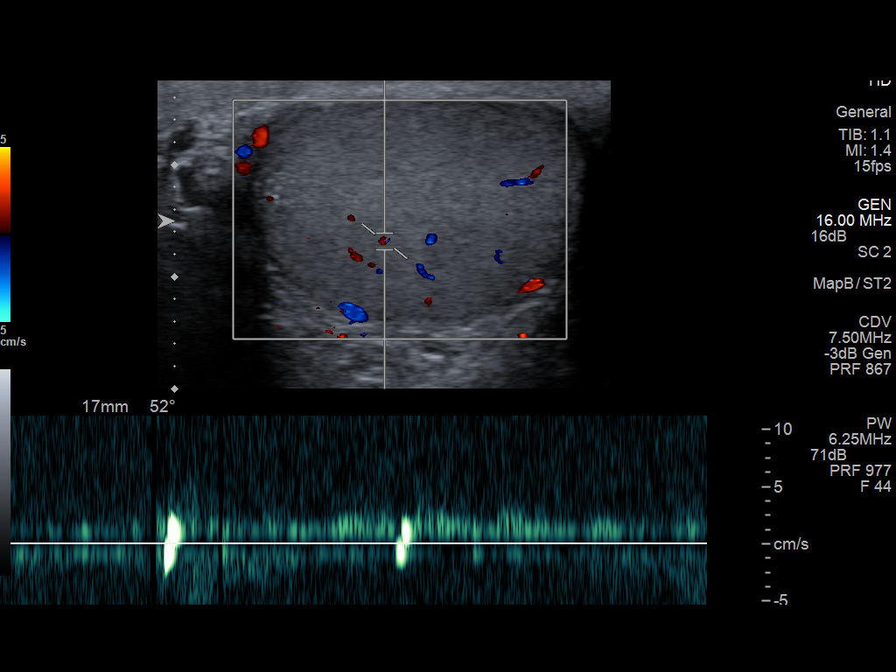
[im 7/8]
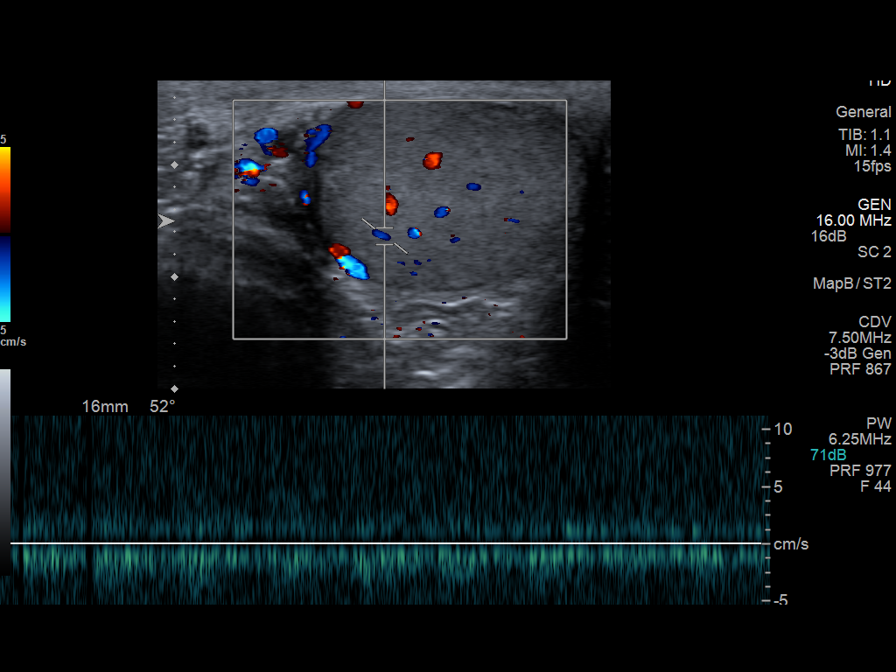
[im 8/8]
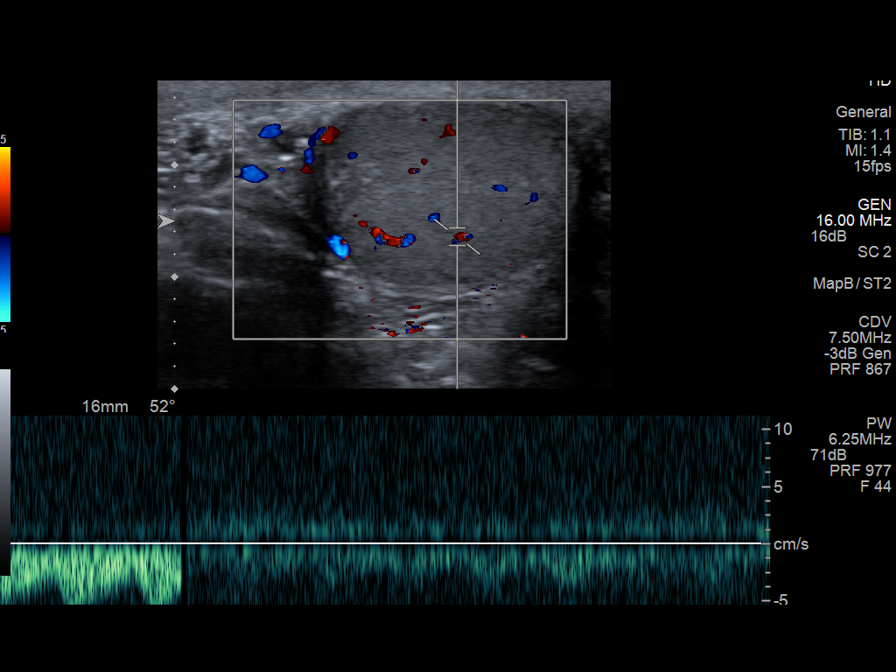

[8 of 8 positions shown; findings below may reference images not displayed]

FINDINGS: Right testicle

Measurements: 4.8 cm x 2.2 cm x 3.3 cm. No mass or microlithiasis
visualized.

Left testicle

Measurements: 5.3 cm x 2.1 cm x 3.0 cm. No mass or microlithiasis
visualized.

Right epididymis:  7 mm epididymal head cyst.  No other abnormality.

Left epididymis: Several small epididymal cysts. No other
abnormalities.

Hydrocele:  No significant hydrocele.

Varicocele:  Positive left varicocele.

Pulsed Doppler interrogation of both testes demonstrates low
resistance arterial and venous waveforms bilaterally.
IMPRESSION: 1. Normal testicles.  Specifically, no mass or portion.
2. Small epididymal cysts.
3. Left-sided varicocele.
4. No other abnormalities. No specific abnormality is seen to
correspond to the reported palpable abnormality.

## 2020-02-05 ENCOUNTER — Ambulatory Visit
Admit: 2020-02-05 | Discharge: 2020-02-05 | Disposition: A | Discharge status: Home / Self Care | Payer: PRIVATE HEALTH INSURANCE | Attending: Emergency Medicine

## 2020-02-17 ENCOUNTER — Encounter
Admit: 2020-02-17 | Discharge: 2020-02-17 | Disposition: A | Discharge status: Home / Self Care | Payer: PRIVATE HEALTH INSURANCE

## 2020-03-15 ENCOUNTER — Encounter
Admit: 2020-03-15 | Discharge: 2020-03-15 | Disposition: A | Discharge status: Home / Self Care | Payer: PRIVATE HEALTH INSURANCE

## 2020-03-15 DIAGNOSIS — G9389 Other specified disorders of brain: Principal | ICD-10-CM

## 2020-03-19 MED ORDER — INSULIN LISPRO 100 UNIT/ML ~~LOC~~ SOLN
0.00 | SUBCUTANEOUS | Status: DC
Start: 2020-03-17 — End: 2020-03-19

## 2020-03-19 MED ORDER — SENNOSIDES-DOCUSATE SODIUM 8.6-50 MG PO TABS
1.00 | ORAL_TABLET | ORAL | Status: DC
Start: 2020-03-17 — End: 2020-03-19

## 2020-03-19 MED ORDER — ACETAMINOPHEN 325 MG PO TABS
650.00 | ORAL_TABLET | ORAL | Status: DC
Start: ? — End: 2020-03-19

## 2020-03-19 MED ORDER — GABAPENTIN 100 MG PO CAPS
300.00 | ORAL_CAPSULE | ORAL | Status: DC
Start: 2020-03-17 — End: 2020-03-19

## 2020-03-19 MED ORDER — DEXTROSE 50 % IV SOLN
12.50 | INTRAVENOUS | Status: DC
Start: ? — End: 2020-03-19

## 2020-03-19 MED ORDER — SODIUM CHLORIDE FLUSH 0.9 % IV SOLN
5.00 | INTRAVENOUS | Status: DC
Start: 2020-03-17 — End: 2020-03-19

## 2020-03-19 MED ORDER — NICOTINE 14 MG/24HR TD PT24
1.00 | MEDICATED_PATCH | TRANSDERMAL | Status: DC
Start: 2020-03-18 — End: 2020-03-19

## 2020-03-19 MED ORDER — BACLOFEN 10 MG PO TABS
10.00 | ORAL_TABLET | ORAL | Status: DC
Start: 2020-03-17 — End: 2020-03-19

## 2020-03-19 MED ORDER — HYDRALAZINE HCL 20 MG/ML IJ SOLN
10.00 | INTRAMUSCULAR | Status: DC
Start: ? — End: 2020-03-19

## 2020-03-19 MED ORDER — PROPRANOLOL HCL 20 MG PO TABS
20.00 | ORAL_TABLET | ORAL | Status: DC
Start: 2020-03-17 — End: 2020-03-19

## 2020-03-19 MED ORDER — LIDOCAINE HCL 1 % IJ SOLN
0.50 | INTRAMUSCULAR | Status: DC
Start: ? — End: 2020-03-19

## 2020-03-19 MED ORDER — ALPRAZOLAM 0.5 MG PO TABS
0.50 | ORAL_TABLET | ORAL | Status: DC
Start: ? — End: 2020-03-19

## 2020-03-19 MED ORDER — GLUCAGON (RDNA) 1 MG IJ KIT
1.00 | PACK | INTRAMUSCULAR | Status: DC
Start: ? — End: 2020-03-19

## 2020-03-19 MED ORDER — DEXAMETHASONE SODIUM PHOSPHATE 4 MG/ML IJ SOLN
4.00 | INTRAMUSCULAR | Status: DC
Start: 2020-03-17 — End: 2020-03-19

## 2020-03-19 MED ORDER — FAMOTIDINE 20 MG PO TABS
20.00 | ORAL_TABLET | ORAL | Status: DC
Start: 2020-03-17 — End: 2020-03-19

## 2020-03-22 DIAGNOSIS — C719 Malignant neoplasm of brain, unspecified: Principal | ICD-10-CM

## 2020-03-30 ENCOUNTER — Encounter: Admit: 2020-03-30 | Discharge: 2020-03-31 | Payer: PRIVATE HEALTH INSURANCE

## 2020-03-30 DIAGNOSIS — C719 Malignant neoplasm of brain, unspecified: Principal | ICD-10-CM

## 2020-04-03 ENCOUNTER — Encounter
Admit: 2020-04-03 | Discharge: 2020-04-03 | Payer: PRIVATE HEALTH INSURANCE | Attending: Student in an Organized Health Care Education/Training Program | Primary: Student in an Organized Health Care Education/Training Program

## 2020-04-03 DIAGNOSIS — C719 Malignant neoplasm of brain, unspecified: Principal | ICD-10-CM

## 2020-04-16 NOTE — Unmapped (Signed)
RADIATION ONCOLOGY TELEHEALTH CONSULTATION NOTE    Encounter Date: 04/17/2020  Patient Name: Darrell [name]  Medical Record Number: [id number]  Patient identity verbally confirmed: Yes.  Verbal consent for telemedicine encounter: Yes.  Location of patient:  [address]  [place] [address]   Location of provider [place], [place]): [place], [place]  Encounter medium: video  Software platform: doximity  Attendees (and relation to patient): Patient, Dr. Flonnie Hailstone, Felipa Emory ANP  Total time: 45 minutes      Referring Physician: Nathaniel Man, MD  [address]  [place],  [place] [address]    Primary Care Provider: Barnett Hatter, NP    ASSESSMENT:  Darrell Veilleux is a 34 y.o. 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 Initially came to medical attention presenting to [facility] campus ED with a 3 week history of intractable headaches with associated photophobia, blurry vision, and nausea/vomiting.  S/P brain-lab guided right temporal craniotomy for resection by Dr. Adriana Simas at [facility]. Final pathology consistent w/ glioblastoma (WHO Grade IV) on 03/21/2020, IDH WT, TERT promotor mutated, MGMT status pending.    Disease Status: now needs CRT  Side effects: having some left arrn and leg weakness since surgery.     RECOMMENDATIONS:  1. CANCER TREATMENT:  We had a detailed discussion with Mr. Lundstrom  regarding the role of radiation therapy in the adjuvant treatment of GBM.  We discussed the randomized data by Stupp et al. for high grade glioma demonstrating a survival benefit with radiation therapy plus temozolomide chemotherapy.  As such, we recommend treatment with radiation therapy and concurrent chemotherapy.  We discussed the logistics, timing, risks, benefits, and side effects associated with radiation therapy.  Potential risks and side effects include but are not limited to fatigue, headache, nausea/vomiting, hair loss, neurologic injury, and radionecrosis.  The patient is meeting with medical oncology to discuss chemotherapy, and we will defer discussion of this aspect of his management to their team. He met with Dr. Theodoro Kalata today.   2. Radiation planning:  He was in agreement with our plan.  As our goal is to begin chemoradiation within ~1 month of surgery, a CT simulation will be performed in our clinic on 04/20/2020 to begin the planning process for the patient's radiation therapy.   We plan to treat to a dose of 60 Gy in 30 fractions daily M-F.  Tentative start date will be 04/30/2020 at [facility] facility. If patient can work, he works in [place] so this would be more convenient  for him.   3.    Symptom management/pain plan: He is having some headaches and nausea. Currently off all steroids. He will notify us if this increases. Also having insomnia. Dr. Theodoro Kalata prescribed notriptyline to start nightly  4.    Social. 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 He has two young children who live with their mother over an hour away.     INFORMED CONSENT:   Not obtained      HISTORY OF PRESENT ILLNESS:  Information pertinent to today's evaluation are the following:      Identifying Statement:  Darrell Bernard is a 34 y.o. 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 Initially came to medical attention presenting to [facility] campus ED with a 3 week history of intractable headaches with associated photophobia, blurry vision, and nausea/vomiting.   ??  Molecular Markers:  Ki67: pending  MGMT promotor: pending  IDH: wildtype  TERT promotor: mutated  ??  Treatment History:  03/15/20: presented to [facility] ED w/ a 3 week history of intractable HAs. CT Head revealed new large ill-defined 6.8 cm hemorrhagic masslike lesion in the right parietotemporal region with prominent surrounding vasogenic edema and mass effect with 1.0 cm leftward midline shift.   03/16/20: brain-lab guided MRI a [facility] confirmed medial right temporal lesion measuring 5.6 x 4.8 x 3.9 cmHGG with satellite lesions as well as a trapped R temporal horn.   03/21/20: s/p brain-lab guided right temporal craniotomy for resection by Dr. Adriana Simas at [facility]. 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  04/17/20: initial eval w/ [facility] for GBM; KPS 90; MRI w/ GTR; proceed w/ chemoRT; RTC 2-3 weeks post completion of chemoRT w/ MRI    Today: We are discussing his treatment plan per video. Patient has lingering LEFT sided extremity weakness as well as left-sided visual field deficit, headaches and nausea. He has been unable to work since surgery.   ??  I have reviewed old/outside medical records (please see above for summary).    REVIEW OF SYSTEMS:    A comprehensive review of 14 systems was negative except for pertinent positives noted in HPI.    Past Medical History:   Diagnosis Date   ??? Migraine         Prior Radiation Therapy:no  Pacemaker: no  Pregnancy status: No; male patient  Collagen Vascular Disease:no    No past surgical history on file.     History reviewed. No pertinent family history.      Social History     Occupational History   ??? Not on file   Tobacco Use   ??? Smoking status: Current Every Day Smoker     Packs/day: 0.40     Years: 10.00     Pack years: 4.00     Types: Cigarettes   ??? Smokeless tobacco: Never Used   Substance and Sexual Activity   ??? Alcohol use: Yes   ??? Drug use: Never   ??? Sexual activity: Not on file       ALLERGIES/MEDICATIONS:  Reviewed in EPIC    PHYSICAL EXAM:   Exam limited d/t televisit  Karnofsky/Lansky Performance Status: 80, Normal activity with effort; some signs or symptoms of disease (ECOG equivalent 1)  Vitals: Not performed/obtained  General: No acute distress, alert and oriented  Pulm: No increased work of breathing, speaking in full sentences  Neuro:  Speech clear/fluent, no expressive aphasia, comprehension full  Psych: Normal mood and affect      RADIOLOGY:  Imaging was personally reviewed as detailed in the history (see above).      PATHOLOGY:   Pathology was personally reviewed as detailed in the HPI.     LABS:  None reviewed for today except pathology            I spent 30  minutes on the real-time audio and video with the patient on the date of service. I spent an additional 15 minutes on pre- and post-visit activities on the date of service.     The patient was physically located in [place] or a state in which I am permitted to provide care. The patient and/or parent/guardian understood that s/he may incur co-pays and cost sharing, and agreed to the telemedicine visit. The visit was reasonable and appropriate under the circumstances given the patient's presentation at the time.    The patient and/or parent/guardian has been advised  of the potential risks and limitations of this mode of treatment (including, but not limited to, the absence of in-person examination) and has agreed to be treated using telemedicine. The patient's/patient's family's questions regarding telemedicine have been answered.     If the visit was completed in an ambulatory setting, the patient and/or parent/guardian has also been advised to contact their provider???s office for worsening conditions, and seek emergency medical treatment and/or call 911 if the patient deems either necessary.         Mina Marble, MD, PhD  Assistant Professor  Department of Radiation Oncology  [facility]  [id number]  [place], [place] [address]  O: [phone]  P: [phone]

## 2020-04-17 ENCOUNTER — Institutional Professional Consult (permissible substitution)
Admit: 2020-04-17 | Discharge: 2020-04-18 | Attending: Student in an Organized Health Care Education/Training Program | Primary: Student in an Organized Health Care Education/Training Program

## 2020-04-17 ENCOUNTER — Encounter
Admit: 2020-04-17 | Discharge: 2020-04-27 | Payer: PRIVATE HEALTH INSURANCE | Attending: Radiation Oncology | Primary: Radiation Oncology

## 2020-04-17 ENCOUNTER — Encounter: Admit: 2020-04-17 | Discharge: 2020-04-27 | Payer: PRIVATE HEALTH INSURANCE

## 2020-04-17 DIAGNOSIS — C719 Malignant neoplasm of brain, unspecified: Principal | ICD-10-CM

## 2020-04-17 MED ORDER — NORTRIPTYLINE 25 MG CAPSULE
ORAL_CAPSULE | 5 refills | 0 days | Status: CP
Start: 2020-04-17 — End: ?

## 2020-04-17 MED ORDER — SULFAMETHOXAZOLE 800 MG-TRIMETHOPRIM 160 MG TABLET
ORAL_TABLET | 0 refills | 0 days | Status: CP
Start: 2020-04-17 — End: ?
  Filled 2020-04-20: qty 12, 28d supply, fill #0

## 2020-04-17 MED ORDER — TEMOZOLOMIDE 20 MG CAPSULE
ORAL_CAPSULE | 0 refills | 0 days | Status: CP
Start: 2020-04-17 — End: ?

## 2020-04-17 MED ORDER — ONDANSETRON 8 MG DISINTEGRATING TABLET
ORAL_TABLET | 10 refills | 0 days | Status: CP
Start: 2020-04-17 — End: ?
  Filled 2020-04-20: qty 60, 20d supply, fill #0

## 2020-04-17 MED ORDER — TEMOZOLOMIDE 140 MG CAPSULE
ORAL_CAPSULE | 0 refills | 0 days | Status: CP
Start: 2020-04-17 — End: ?
  Filled 2020-04-20: qty 30, 30d supply, fill #0

## 2020-04-18 DIAGNOSIS — C719 Malignant neoplasm of brain, unspecified: Principal | ICD-10-CM

## 2020-04-18 NOTE — Unmapped (Signed)
Hi,     Patient contacted the Communication Center requesting to speak with the care team of Darrell [name] to discuss:    I'm wondering about survival rates and stuff that I want to ask Dr. Theodoro Kalata. I had a phone visit with him yesterday but I didn't want to ask those questions in front of my brother.    Please contact patient at [phone].    Check Indicates criteria has been reviewed and confirmed with the patient:    [x]  Preferred Name   [x]  DOB and/or MR#  [x]  Preferred Contact Method  [x]  Phone Number(s)   []  MyChart     Thank you,   Laverna Peace  [facility]   [phone]

## 2020-04-18 NOTE — Unmapped (Signed)
[facility]SC Specialty Medication Onboarding    Specialty Medication: Temozolomide 140 mg capsule, Temozolomide 20 mg capsule, Sulfamethoxazole/Trimethoprim 800-160 mg tablet, Ondansetron 8 mg tablet   Prior Authorization: Not Required   Financial Assistance: No - copay  <$25  Final Copay/Day Supply: $0.00 / 30 day supply     Insurance Restrictions: Yes - max 1 month supply     Notes to Pharmacist: A referral was submitted for no active insurance, but [employer] doesn't have a Electrical engineer program. I called and got this him a 30 day TEMP PAP benefit and the PAP office will be mailing him a full application.  If he doesn't have active insurance, will you make sure he knows that the full application with all financial documents would have to be completed by his next fill for [place] to continue sending his medication.      The triage team has completed the benefits investigation and has determined that the patient is able to fill this medication at [facility]. Please contact the patient to complete the onboarding or follow up with the prescribing physician as needed.

## 2020-04-18 NOTE — Unmapped (Signed)
Called patient back in regards to question about life expectancy.      We discussed per Dr. Theodoro Kalata:      Disease progression in 2 years is usually 50%. Meaning that 50% of pts will have their tumors grow back in that time frame. The remainder of people could still have progression years out from their diagnosis. If a tumor regrows, the survival time frame is roughly 6 to 9 months. However, it could be better in some patients. Clinical trials would be important to explore at that time.       Darrell [name] also was wondering if he should be concerned about his vision.  When he focuses on a picture, it feels like it is moving.  He has noticed this only happening this week.

## 2020-04-18 NOTE — Unmapped (Signed)
Radiation Oncology Treatment Planning Note    Patient Name: Darrell [name]  Patient Age: 34 y.o.  Date of Encounter: 04/17/2020    Diagnoses:   1. Glioblastoma (CMS-HCC)    2. Glioma (CMS-HCC)      Stage: Cancer Staging  No matching staging information was found for the patient.     Treatment Intent: definitive/curative    CLINICAL TREATMENT PLANNING:     Darrell [name] has glioblastoma. Please refer to the consult for full clinical details.    I plan to treat him with photons utilizing IMRT technique.     The radiation target area/treatment site will be the post-surgical FLAIR signal with a boost to the post-surgery contrast-enhancing lesion with margin.     I will attempt to minimize the dose to the brain, optic apparatus, brain stem, and parotids.    The total radiation dose will be 4600 cGy at 200 cGy/fraction for a total of 23 fractions to the initial volume followed by a boost of 1400 cGy at 200 cGy/fraction for a total of 7 fractions, treated once a day. Chemotherapy will be administered concurrently.    Technique Rationale:   IMRT: IMRT is necessary to increase dose conformity and to review [facility] information and isodose distribution for the target, optic nerves, globes, brainstem, brain, and parotids.    Simulation Order: I requested a radiation treatment planning CT scan to acquire information regarding the patient's anatomy at the treatment site, radiation treatment target volumes, and adjacent normal organs. This is required to be done in the patient's treatment position for radiation treatment planning and for the patient's subsequent radiation treatment positioning.      Image Fusion:  I have ordered that the following additional imaging studies be fused to the CT simulation scan to aid in tumor mapping and treatment: post-operative MRI, T1 post-contrast and T2 FLAIR.    Special Treatment Procedure: Treatment planning for Darrell [name] is more complex and requires more time because of concurrent chemotherapy necessitating more monitoring/management of acute toxicities.    Verification Simulation: I have requested for the patient to come in for verification simulation on the treatment machine to ensure that information was transferred appropriately from the planning system to the treatment machine treatment and to verify patient setup, immobilization, and image guidance.    Image Guidance/Tracking: Daily CBCT/MVCT to assess the position accuracy of the target volume and critical structures.         Mina Marble, MD, PhD  Department of Radiation Oncology  [facility]  [address], [address]  [place], [place] [address]  O: [phone]

## 2020-04-18 NOTE — Unmapped (Addendum)
Darrell [name]        Referring Provider:   Purcell Mouton, RN Navigator w/ NeuroOncology    Reason for Referral:  Pt is uninsured    Encounter Type:  T/C to patient    Oncology Treatment Status:  Pt is a 67 yom with newly diagnosed glioblastoma who will start oral chemotherapy and RT (30 fractions once per day) soon.    Psychosocial Status and Case Management:  Pt lives in [place], [place] ([place] miles from [facility]), having moved in with his younger, single brother when he was diagnosed with cancer.  He has two children (5 and 2 yrs) who live with their mother.  Pt had to quit working 03/09/20, and has no income now, so is reliant on his brother financially.    Pt is uninsured and has applied for SSDI and Medicaid, both of which are pending.  He plans to complete and submit his [facility] via MyChart this week.  Pt has temporary 30 day [facility] benefit through 05/18/20. He still needs a copy of PAP app.  *I provided an intervention for the Financial Resource Strain SDOH domain. The intervention was Assist with Applications for Services    - Emailed patient Darrell [name] PAP app to complete and submit.  -  Reviewed required supporting documents for both FA and PAP applications, including:     1) copy of [place] state ID w/ address     2) most recent 3 months bank statements     3) [name] separation letter with last date of employment     4) Financial support letter from his brother (since his SSDI has not yet been approved or started)     5) 2020 federal taxes (if declared) -- W2s are NOT necessary       Pt requests assistance with transportation costs. He is eligible for [employer] gas cards.  Completed [employer] gas card program application and provided to Darrell [name], [facility] Patient Assistance Counselor, for approval.  Informed patient that once treatment plan is established, gas cards will be mailed to them for upcoming appts (as per current gas card program policy during [id number] epidemic). Pt endorses that he has been struggling with anxiety/stress and inability to sleep related to gus diagnosis.   Yesterday Dr. Theodoro Kalata started him on a new Rx (Nortryptiline) to take at night for both headaches and sleep.   * I provided an intervention for the Stress SDOH domain. The intervention was Referral to AYA Counseling program with [facility].  - Talked with him about two emotional/social support resources, including:   [facility] Living Well with Cancer WebEx Relaxation and Support group- provided patient Living Well with Cancer WebEx group registration link   [place] AYA [facility] in [facility]. Pt is interested and appreciates referral to program. Placed referral to AYA in-basket pool.    Follow-Up Plan:  Pt has this SW'ers contact information and will reach out for psychosocial assistance as needed.     Claris Gladden, OSW-C  Oncology Outpatient Social Worker  Phone: [phone]

## 2020-04-18 NOTE — Unmapped (Signed)
[facility]   Patient Onboarding/Medication Counseling    Darrell [name] is a 34 y.o. male with glioblastoma who I am counseling today on initiation of therapy.  I am speaking to the patient.    Was a Nurse, learning disability used for this call? No    Verified patient's date of birth / HIPAA.    Specialty medication(s) to be sent: Hematology/Oncology: Temozolomide 20mg  and Temozolomide 140mg       Non-specialty medications/supplies to be sent: ondansetron and SMX-TMP      Medications not needed at this time: none         Temodar (temozolomide)    Medication & Administration     Dosage: Take 1 capsule (20mg ) by mouth along with one 140 mg capsule (total dose 160 mg) once daily 30 minutes after zofran 1 hr prior to radiation and at bedtime on weekends for 42 consecutive days starting on day 1 of radiation.  Ondansetron: Take 1 tablet by mouth 30 minutes before chemo and every 8 hours as needed for nausea.  SMX-TMP: Take 1 tablet by mouth on Mondays, Wednesdays, and Fridays while on radiation therapy.    Administration:   ??? Take without food at bedtime.  o   At least 1 hour before our two hours after meals  ??? Take with a full glass of water  ??? Swallow capsules whole, do not open or chew.  ??? Anti-emetics are recommended to prevent nausea and vomiting. Take prescribed anti-emetic 30 minutes prior to scheduled dose.  If you throw up after taking this drug, do not repeat the dose.    ??? Remember to take the medication as prescribed.  Your dose may be made up of 2 or more different strengths and colors of capsules.       Adherence/Missed dose instructions:   ??? If you miss a dose, contact your prescriber immediately for instructions for further instructions.  ??? If you throw up after taking this drug, do not repeat the dose    Goals of Therapy     ??? To prevent disease progression  ??? To treat brain cancer    Side Effects & Monitoring Parameters     Commonly reported side effects  ??? Fatigue  ??? Nausea, vomiting, constipation, diarrhea, stomach pain/upset stomach (Usually antiemetic prescribed for nausea, can take stool softener/Miralax for constipation, Imodium AD/loperamide for diarrhea)  ??? Abdominal pain  ??? Change in taste, loss of appetite  ??? Decreased red blood cell count (anemia) - feeling dizzy, sleepy, tired, or weak  ??? Weight gain  ??? Back pain, muscle pain, joint pain  ??? Difficulty seeping  ??? Common cold symptoms  ??? Hair loss  ??? Dry skin  ??? Headache  ??? Mouth irritation or mouth sores (may use baking soda or salt water rinses 3-4 times daily)    The following side effects should be reported to the provider:  ??? Signs of infection (fever >100.4, chills, mouth sores, sputum production)  ??? Unexplained bruising or bleeding (vomiting or coughing up blood, blood that looks like coffee grounds, blood in the urine or black, red tarry stools, bruising that gets bigger without reason, any persistent or severe bleeding, impaired wound healing)  ??? Signs of liver problems (dark urine, abdominal pain, light-colored stools, vomiting, yellow skin or eyes, not hungry)  ??? Any abdominal burning, numbness, or tingling feeling  ??? Signs of cerebrovascular disease (change in strength on one side is greater than the other, trouble speaking or thinking, change in balance,  drooping on one side of the face, or vision changes)  ??? Shortness of breath, excess weight gain, swelling in arms or legs  ??? Confusion, mood changes, trouble with memory, seizures  ??? Difficulty controlling bladder  ??? Burning or numbness feeling  ??? Severe headache  ??? Vision changes  ??? Difficulty swallowing  ??? Severe loss of energy and strength  ??? Pale skin, pinpoint red spots on skin  ??? Signs of allergic reaction / anaphylaxis (wheezing, chest tightness, swelling of face, lips, tongue or throat)  ??? Rash (physician can prescribe prednisone to alleviate)  ??? Breast pain    Monitoring Parameters:   ??? CBC with differential and platelets at baseline and during treatment  ??? Liver function at baseline and during treatment  ??? Evaluate pregnancy status prior to use in females of reproductive potential  ??? Monitor for lymphopenia and for signs/symptoms of Pneumocystis jirovecii pneumonia  ??? Monitor adherence    Contraindications, Warnings, & Precautions     Contraindications:  ??? Hypersensitivity to temozolomide or any component of the formulation  ??? Hypersensitivity to dacarbazine     Warnings & Precautions:  ??? Bone marrow suppression: Myelosuppression some with fatal outcomes, may occur. Hematologic toxicity may require treatment interruption, dose reduction, and/or discontinuation.   ??? GI toxicity: Temozolomide is associated with a moderate emetic potential; antiemetics may be recommended to prevent nausea and vomiting.  ??? Hepatotoxicity: Hepatotoxicity has been reported; may be severe or fatal. Monitor liver function tests at baseline, halfway through the first cycle, prior to each subsequent cycle, and at ~2 to 4 weeks after the last temozolomide dose.  ??? Hypersensitivity: Allergic reactions (including anaphylaxis) have been observed with temozolomide.  ??? Pneumocystis pneumonia: Pneumocystis jirovecii pneumonia (PCP) may occur in patients receiving temozolomide; risk is increased in those receiving corticosteroids or with longer temozolomide treatment regimens. Monitor all patients for development of lymphopenia and PCP.   ??? Secondary malignancies: Cases of myelodysplastic syndromes and secondary malignancies, including myeloid leukemia, have been reported following treatment with temozolomide.  ??? Avoid sun exposure  ??? Hepatic impairment: Use with caution in patients with severe hepatic impairment.  ??? Renal impairment: Use with caution in patients with severe renal impairment; has not been studied in dialysis patients.  ??? Elderly: Patients ?35 years of age experienced a higher incidence of grade 4 neutropenia and thrombocytopenia in cycle 1 (compared to younger patients).  ??? Polysorbate 80: Some dosage forms may contain polysorbate 80 (also known as Tweens). Hypersensitivity reactions, usually a delayed reaction, have been reported following exposure to pharmaceutical products containing polysorbate 80 in certain individuals  ??? Temozolomide resistance: Increased MGMT activity/levels within tumor tissue is associated with temozolomide resistance.   ??? Reproductive Considerations: Evaluate pregnancy status prior to use in females of reproductive potential. Females of reproductive potential should use effective contraception during treatment and for at least 6 months after the last temozolomide dose. Males with pregnant partners or with male partners of reproductive potential should use condoms during treatment and for at least 3 months after the last temozolomide dose. Males should not donate semen during treatment and for at least 3 months after the last temozolomide dose.  Temozolomide may impair fertility; limited data indicate changes in sperm parameters during temozolomide treatment; however, there is no information in duration or reversibility of sperm changes.  Based on the mechanism of action and findings in animal reproduction studies, in utero exposure to temozolomide may cause fetal harm.  ??? Breastfeeding Considerations: It is not known if  temozolomide is present in breast milk. Due to the potential for serious adverse reactions (including myelosuppression) in the breastfed infant, breastfeeding is not recommended by the manufacturer during treatment and for at least 1 week after the last temozolomide dose    Drug/Food Interactions     ??? Medication list reviewed in Epic. The patient was instructed to inform the care team before taking any new medications or supplements. No drug interactions identified.   ??? Do not receive immunizations/vaccinations without contacting doctor, including flu shot.  ??? Food reduces the rate and extent of absorption.    Storage, Handling Precautions, & Disposal     ??? Store at room temperature in the original container in a dry place (do not use a pillbox or store with other medications).   ??? Caregivers helping administer medication should wear gloves and wash hands immediately after.    ??? Keep the lid tightly closed. Keep out of the reach of children and pets.  ??? If the capsule I opened or broken, do not touch the contents. If the contents are touched or they get in the eyes, wash hands or eyes immediately.    ??? Do not flush down a toilet or pour down a drain unless instructed to do so.  Check with your local police department or fire station about drug take-back programs in your area.  ??? This drug is considered hazardous and should be handled as little as possible.    Current Medications (including OTC/herbals), Comorbidities and Allergies     Current Outpatient Medications   Medication Sig Dispense Refill   ??? ALPRAZolam (XANAX) 1 MG tablet      ??? baclofen (LIORESAL) 20 MG tablet Take 20 mg by mouth Three (3) times a day.     ??? nortriptyline (PAMELOR) 25 MG capsule 1 tab PO at bedtime x 1 week, then increase to 2 tabs PO qhs 60 capsule 5   ??? ondansetron (ZOFRAN-ODT) 8 MG disintegrating tablet Take 1 tablet by mout 30 minutes before chemo and every 8 hours as needed for nausea. 60 tablet 10   ??? propranoloL (INDERAL) 20 MG tablet      ??? sulfamethoxazole-trimethoprim (BACTRIM DS) 800-160 mg per tablet Take 1 tablet by mouth on Monday, Wednesday, Friday while on radiation therapy. 20 tablet 0   ??? temozolomide (TEMODAR) 140 mg capsule Take 1 capsule (140mg ) by mouth along with one 20 mg capsule (total dose 160 mg) once daily 30 minutes after zofran 1 hr prior to radiation and at bedtime on weekends for 42 consecutive days starting on day 1 of radiation. 42 capsule 0   ??? temozolomide (TEMODAR) 20 mg capsule Take 1 capsule (20mg ) by mouth along with one 140 mg capsule (total dose 160 mg) once daily 30 minutes after zofran 1 hr prior to radiation and at bedtime on weekends for 42 consecutive days starting on day 1 of radiation. 42 capsule 0     No current facility-administered medications for this visit.       No Known Allergies    Patient Active Problem List   Diagnosis   ??? Glioblastoma (CMS-HCC)   ??? Anxiety       Reviewed and up to date in Epic.    Appropriateness of Therapy     Is medication and dose appropriate based on diagnosis? Yes    Prescription has been clinically reviewed: Yes    Baseline Quality of Life Assessment      How many days over the past month  did your glioblastoma  keep you from your normal activities? For example, brushing your teeth or getting up in the morning. 0    Financial Information     Medication Assistance provided: None Required    Anticipated copay of $0/30 days (TMZ 20 mg, TMZ 140mg , SMX-TMP, and ondansetron)  reviewed with patient. Verified delivery address.    Delivery Information     Scheduled delivery date: 04/20/20    Expected start date: First day of radiation - unknown    Medication will be delivered via Same Day Courier to the prescription address in [facility].  This shipment will not require a signature.      Explained the services we provide at [facility] and that each month we would call to set up refills.  Stressed importance of returning phone calls so that we could ensure they receive their medications in time each month.  Informed patient that we should be setting up refills 7-10 days prior to when they will run out of medication.  A pharmacist will reach out to perform a clinical assessment periodically.  Informed patient that a welcome packet and a drug information handout will be sent.      Patient verbalized understanding of the above information as well as how to contact the pharmacy at [phone] option 4 with any questions/concerns.  The pharmacy is open Monday through Friday 8:30am-4:30pm.  A pharmacist is available 24/7 via pager to answer any clinical questions they may have.    Patient Specific Needs     - Does the patient have any physical, cognitive, or cultural barriers? No    - Patient prefers to have medications discussed with  Patient     - Is the patient or caregiver able to read and understand education materials at a high school level or above? Yes    - Patient's primary language is  English     - Is the patient high risk? Yes, patient is taking oral chemotherapy. Appropriateness of therapy has been assessed.     - Does the patient require a Care Management Plan? No     - Does the patient require physician intervention or other additional services (i.e. nutrition, smoking cessation, social work)? No      Rohit Deloria [facility] Specialty Pharmacist

## 2020-04-18 NOTE — Unmapped (Signed)
Encounter addended by: Verneda Skill, RN on: 04/18/2020 3:49 PM   Actions taken: Charge Capture section accepted

## 2020-04-19 ENCOUNTER — Other Ambulatory Visit: Admit: 2020-04-19 | Discharge: 2020-04-20 | Payer: PRIVATE HEALTH INSURANCE

## 2020-04-19 NOTE — Unmapped (Signed)
Hi,     Darrell contacted the [employer] requesting to speak with the care team of Darrell [name] to discuss:    Patient called to say he don't remember scheduling 06/26/2020 appointments.    Please contact Darrell at [phone].        Check Indicates criteria has been reviewed and confirmed with the patient:    [x]  Preferred Name   [x]  DOB and/or MR#  [x]  Preferred Contact Method  [x]  Phone Number(s)   []  MyChart     Thank you,   Jacques Navy  [facility]   [phone]

## 2020-04-20 LAB — CREATININE, WHOLE BLOOD
CREATININE, WHOLE BLOOD: 0.8 mg/dL (ref 0.8–1.4)
Lab: 0.8

## 2020-04-20 MED FILL — TEMOZOLOMIDE 20 MG CAPSULE: 30 days supply | Qty: 30 | Fill #0 | Status: AC

## 2020-04-20 MED FILL — ONDANSETRON 8 MG DISINTEGRATING TABLET: 20 days supply | Qty: 60 | Fill #0 | Status: AC

## 2020-04-20 MED FILL — TEMOZOLOMIDE 140 MG CAPSULE: 30 days supply | Qty: 30 | Fill #0

## 2020-04-20 MED FILL — TEMOZOLOMIDE 140 MG CAPSULE: 30 days supply | Qty: 30 | Fill #0 | Status: AC

## 2020-04-20 MED FILL — SULFAMETHOXAZOLE 800 MG-TRIMETHOPRIM 160 MG TABLET: 28 days supply | Qty: 12 | Fill #0 | Status: AC

## 2020-04-20 NOTE — Unmapped (Signed)
Pt presented to recovery room accompanied by other for IV SIM prep. Saline lock inserted with stat creatinine obtained and sent to lab. IV contrast form completed. Comfort measures performed. Ready for IV SIM.

## 2020-04-20 NOTE — Unmapped (Signed)
Patient here to sign consent and further discuss his care. Refer to our initial consult. He brings a friend with him. We reviewed side effects of radiation including fatigue, headaches.   We discussed sleep hygiene. Has started on amitriptyline nightly this week. Recommend he stick with this for a few weeks to see if it helps     We discussed the risks, benefits, side effects, and alternative treatments. The possibility of severe/permenant radiation damage to normal tissues within the radiated area were discussed.  Signed, witnessed consent was obtained.

## 2020-04-21 NOTE — Unmapped (Signed)
Radiation Oncology Treatment Planning Note    Patient Name: Darrell [name]  Patient Age: 34 y.o.  Date of Encounter: 04/20/2020    Diagnoses:   1. Glioblastoma (CMS-HCC)      Stage: Cancer Staging  No matching staging information was found for the patient.     Treatment Intent: definitive/curative    SIMULATION:    Type:  Initial simulation of the brain    Contrast: IV contrast.    The patient was taken to the CT simulation room and placed in a supine position with a customized immobilization mask.  A CT scan was obtained through the pertinent area including the entire brain. I approved the patient set up and reviewed the CT images; both are adequate. I tentatively plan to utilize multiple fields. The number and use of treatment devices will be determined at the time of computerized planning.    I have placed an isocenter/localization point in three dimensions on these images.  This was marked on the patient???s mask for subsequent radiation treatment set-up.  Additional details of the CT simulation are available in the departmental [facility] electronic medical record.  CT images were then transferred to the radiation treatment planning computer for planning and dosimetry.      Mina Marble, MD, PhD  Department of Radiation Oncology  [facility]  [address]  [place], [place] [address]  O: [phone]

## 2020-04-23 ENCOUNTER — Ambulatory Visit: Admit: 2020-04-23 | Discharge: 2020-04-24

## 2020-04-23 DIAGNOSIS — C719 Malignant neoplasm of brain, unspecified: Principal | ICD-10-CM

## 2020-04-23 NOTE — Unmapped (Signed)
Darrell [name] is a 34 y.o. male  with glioblastoma who presents for evaluation of vision loss.     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    - Patient states that since surgery, his vision both eyes has been blurry with current glasses, and images look like they are moving.  Gets daily headaches.  No double vision.     Referral: Dr. Maxine Glenn, MD     MRI Brain and Orbits: 03/19/20    IMPRESSION:   1. ??Interval postsurgical changes of right temporal lobe mass resection.   Areas of nodular enhancement about the resection cavity may represent   residual tumor.   2. ??Significant interval decrease in size of the temporal horn of the right   lateral ventricle. Slight decrease in size of the temporal horn of the left   lateral ventricle.   3. ??Unchanged 1.1 cm of leftward midline shift and right uncal herniation.        Previous images:  None      Today's Exam:  Images (April 23, 2020):    HVF OU: right quadrantanopia   OCT RNFL OU: WNL OU  OCT GCC OU: mildly decreased    Assessment and plan:   - Vision loss in a patient with glioblastoma. Today in the office there are   Deceased HVF, and some mild changes in OCT [facility]. Will floow in 1 month after chemotherapy.   - Follow up in 1 month  - I have spent more than 50% of the provider's face-to-face visit time with a patient is spent in counseling or coordination of care.  - I was present and involved in all parts of the service and the documentation is mine.   - I instructed patient to call [facility] at [phone] for questions/concerns or report immediately to the Emergency department.  - CC referring provider

## 2020-04-24 DIAGNOSIS — C719 Malignant neoplasm of brain, unspecified: Principal | ICD-10-CM

## 2020-04-24 NOTE — Unmapped (Signed)
Called Darrell [name] and reviewed administration for TMZ, ondansetron, and SMX-TMP.  First radiation appt scheduled for 04/30/20 at 2:15 pm.  Discussed to take TMZ @ 1:15 pm, ondansetron at 12:45 pm.  Make sure empty stomach so no food after 11:15 am and may eat again after 2:15 pm.  Reviewed med list and no interactions.  Patient asked if okay to drink a beer occassionally and advised it is better to avoid alcohol but 1 beer would be okay.    He is aware to reach out if any other questions or concerns regarding his medications.    Horace Porteous, PharmD  [facility]

## 2020-04-26 NOTE — Unmapped (Signed)
[facility]  Oncology Outpatient Social Work      Reason for Contact:  Follow-up for gas cards and status of pending SSD/Medicaid/FA/PAP applications    Oncology Treatment Update:  Pt had been scheduled to start radiation (30 fractions) on 04/30/20, but now he is being sent for an MRI due to tumor growth, and treatment plan may change.  Plan TBD    Pt reports that his SSD/Medicaid app is still pending.  Per acct., he has NOT yet submitted Neurosurgeon.    Psychosocial Update and Case Management:  T/C to patient.  He explained that treatment plan may be changing, and he is understandably worried. Provided active listening and support.    Follow-Up Plan:  Plan to follow-up with patient early next week as to treatment plan update.  Pt has this SW'ers contact information and will reach out for psychosocial assistance as needed.     Claris Gladden, OSW-C  Oncology Outpatient Social Worker  Phone: [phone]

## 2020-04-28 ENCOUNTER — Encounter: Admit: 2020-04-28 | Discharge: 2020-04-29 | Payer: PRIVATE HEALTH INSURANCE

## 2020-04-29 NOTE — Unmapped (Signed)
Hem/Onc Phone Triage Note    Caller: Patient    Reason for Call:   Patient called after reviewing the results of his post-operative MRI he had performed yesterday (04/28/20). Results show multiple cystic foci as well as multifocal enhancing lesions in the right cerebrum and right lateral ventricle concerning for residual disease.     He called to assess if his plan for chemoRT which is to start tomorrow (04/30/20) is to move forward. On review, he is scheduled to see Dr. Beckey Rutter tomorrow afternoon. Advised he plan on attending appointment, but will send note to his team so they may adjust as needed.      Fellow Taking Call:  Everrett Coombe Odas Ozer  Apr 29, 2020 10:05 AM

## 2020-04-30 ENCOUNTER — Encounter
Admit: 2020-04-30 | Discharge: 2020-05-28 | Payer: PRIVATE HEALTH INSURANCE | Attending: Radiation Oncology | Primary: Radiation Oncology

## 2020-04-30 ENCOUNTER — Ambulatory Visit: Admit: 2020-04-30 | Discharge: 2020-05-28 | Payer: PRIVATE HEALTH INSURANCE

## 2020-04-30 ENCOUNTER — Encounter: Admit: 2020-04-30 | Discharge: 2020-05-28 | Payer: PRIVATE HEALTH INSURANCE

## 2020-04-30 NOTE — Unmapped (Signed)
Spoke with patient.    Per Dr. Flonnie Hailstone: expecting some change on the MRI. ??These new areas are all being covered by the radiation fields so no need to change our plan, and agree he should proceed with treatment.    Patient said he was just curious and will be sure to keep his appointment today.

## 2020-05-02 ENCOUNTER — Encounter
Admit: 2020-05-02 | Discharge: 2020-05-02 | Payer: PRIVATE HEALTH INSURANCE | Attending: Student in an Organized Health Care Education/Training Program | Primary: Student in an Organized Health Care Education/Training Program

## 2020-05-02 DIAGNOSIS — C719 Malignant neoplasm of brain, unspecified: Principal | ICD-10-CM

## 2020-05-02 MED ORDER — LEVETIRACETAM 1,000 MG TABLET
ORAL | 0 days
Start: 2020-05-02 — End: 2020-06-01

## 2020-05-02 NOTE — Unmapped (Signed)
Encounter error

## 2020-05-02 NOTE — Unmapped (Signed)
Hem/Onc Phone Triage Note    Caller: Pt's brother    Reason for Call:   Darrell [name] is a 34 y.o. male with a notable PMHx of glioblastoma (s/p right temporal craniotomy for gross total resection on 03/21/20 at [facility]) whose brother called in tonight to report that the pt had a seizure earlier in the day and was currently in the [facility] ED.    Pt's brother called to ask if pt should be brought to [facility] as he is known to Med Onc and Rad Onc here.  Advised that I would defer to the assessment made by the providers at [facility] who are currently taking care of the pt.  Also, advised that they could ask about/discuss possible transfer with the providers at [facility], but discussed that it is the providers at [facility] who would have to contact us to discuss transfer.  I will send a message to the pt's primary team to let them know about the pt's seizure and presentation to [facility].    Fellow Taking Call:  Doree Barthel  May 02, 2020 12:02 AM

## 2020-05-03 DIAGNOSIS — C719 Malignant neoplasm of brain, unspecified: Principal | ICD-10-CM

## 2020-05-03 LAB — CBC W/ AUTO DIFF
BASOPHILS RELATIVE PERCENT: 0.3 %
EOSINOPHILS ABSOLUTE COUNT: 0.2 10*9/L (ref 0.0–0.7)
EOSINOPHILS RELATIVE PERCENT: 2.2 %
HEMATOCRIT: 42.4 % (ref 38.0–50.0)
HEMOGLOBIN: 14.6 g/dL (ref 13.5–17.5)
LYMPHOCYTES ABSOLUTE COUNT: 1.4 10*9/L (ref 0.7–4.0)
LYMPHOCYTES RELATIVE PERCENT: 14.9 %
MEAN CORPUSCULAR HEMOGLOBIN CONC: 34.4 g/dL (ref 30.0–36.0)
MEAN CORPUSCULAR VOLUME: 94.8 fL (ref 81.0–95.0)
MEAN PLATELET VOLUME: 8.2 fL (ref 7.0–10.0)
MONOCYTES ABSOLUTE COUNT: 0.7 10*9/L (ref 0.1–1.0)
MONOCYTES RELATIVE PERCENT: 7.5 %
NEUTROPHILS ABSOLUTE COUNT: 7.1 10*9/L (ref 1.7–7.7)
NEUTROPHILS RELATIVE PERCENT: 75.1 %
NUCLEATED RED BLOOD CELLS: 0 /100{WBCs} (ref <=4)
PLATELET COUNT: 239 10*9/L (ref 150–450)
RED CELL DISTRIBUTION WIDTH: 13 % (ref 12.0–15.0)
WBC ADJUSTED: 9.5 10*9/L (ref 3.5–10.5)

## 2020-05-03 LAB — COMPREHENSIVE METABOLIC PANEL
ALBUMIN: 3.7 g/dL (ref 3.4–5.0)
ALBUMIN: 3.7 g/dL (ref 3.4–5.0)
ALKALINE PHOSPHATASE: 53 U/L (ref 46–116)
ALKALINE PHOSPHATASE: 54 U/L (ref 46–116)
ALT (SGPT): 18 U/L (ref 10–49)
ANION GAP: 5 mmol/L (ref 3–11)
ANION GAP: 5 mmol/L (ref 3–11)
AST (SGOT): 21 U/L (ref <34)
AST (SGOT): 20 U/L (ref <34)
BILIRUBIN TOTAL: 1.3 mg/dL — ABNORMAL HIGH (ref 0.3–1.2)
BILIRUBIN TOTAL: 1.3 mg/dL — ABNORMAL HIGH (ref 0.3–1.2)
BLOOD UREA NITROGEN: <5 mg/dL — ABNORMAL LOW (ref 9–23)
BLOOD UREA NITROGEN: <5 mg/dL — ABNORMAL LOW (ref 9–23)
CALCIUM: 9.6 mg/dL (ref 8.7–10.4)
CALCIUM: 9.6 mg/dL (ref 8.7–10.4)
CHLORIDE: 108 mmol/L — ABNORMAL HIGH (ref 98–107)
CHLORIDE: 108 mmol/L — ABNORMAL HIGH (ref 98–107)
CO2: 29.8 mmol/L (ref 20.0–31.0)
CO2: 29.7 mmol/L (ref 20.0–31.0)
CREATININE: 0.7 mg/dL (ref 0.60–1.10)
CREATININE: 0.7 mg/dL (ref 0.60–1.10)
EGFR CKD-EPI AA MALE: >90 mL/min/{1.73_m2}
EGFR CKD-EPI AA MALE: >90 mL/min/{1.73_m2}
EGFR CKD-EPI NON-AA MALE: >90 mL/min/{1.73_m2}
EGFR CKD-EPI NON-AA MALE: >90 mL/min/{1.73_m2}
GLUCOSE RANDOM: 89 mg/dL (ref 70–179)
POTASSIUM: 4.3 mmol/L (ref 3.5–5.1)
POTASSIUM: 4.3 mmol/L (ref 3.5–5.1)
PROTEIN TOTAL: 6.5 g/dL (ref 5.7–8.2)
SODIUM: 143 mmol/L (ref 135–145)
SODIUM: 143 mmol/L (ref 135–145)

## 2020-05-03 LAB — PROTEIN TOTAL: Protein:MCnc:Pt:Ser/Plas:Qn:: 6.5

## 2020-05-03 LAB — ALBUMIN: Albumin:MCnc:Pt:Ser/Plas:Qn:: 3.7

## 2020-05-03 LAB — WBC ADJUSTED: Leukocytes:NCnc:Pt:Bld:Qn:: 9.5

## 2020-05-03 NOTE — Unmapped (Signed)
05/03/2020    Dose Site Summary:  YQ:IHKVQQVZ: 05/03/2020: 600/4,600 cGy  DG:LOVFIEPP: : 0/1,400 cGy  IRJ:JOACZ GB: 05/03/2020: 600 cGy  Subjective/Assessment/Recommendations:  Had seizure yesterday and went ot local ER. He had imaging done. He was restarted on his seizure meds. Keppra 1000mg  twice a day. Seizure activity looked like tonic mvts and tongue biting. Pt was not aware  1. Ataxia/balance: no issues  2. Confusion:none  3. Insomnia:none  4. Nausea/Vomiting:none  5. Nutrition: eating well.   6. Fatigue: takes zanax prior to bed if he is anxious but still does ot sleep much.   7. Pain: headache. Relieved by oxycodone.   8. Elimination no issues  9. Prescription Needs:  10. Psychosocial:has good support system  11. Other:

## 2020-05-04 LAB — HIV ANTIGEN/ANTIBODY COMBO: HIV 1+2 Ab+HIV1 p24 Ag:PrThr:Pt:Ser/Plas:Ord:IA: NONREACTIVE

## 2020-05-04 LAB — HEPATITIS B SURFACE ANTIGEN: Hepatitis B virus surface Ag:PrThr:Pt:Ser:Ord:: NONREACTIVE

## 2020-05-04 LAB — HEPATITIS B SURFACE ANTIBODY
HEPATITIS B SURFACE ANTIBODY: NONREACTIVE
Hepatitis B virus surface Ab:PrThr:Pt:Ser:Ord:: NONREACTIVE

## 2020-05-04 LAB — HEPATITIS B CORE TOTAL ANTIBODY: Hepatitis B virus core Ab:PrThr:Pt:Ser/Plas:Ord:IA: NONREACTIVE

## 2020-05-04 LAB — HEPATITIS C ANTIBODY: Hepatitis C virus Ab:PrThr:Pt:Ser:Ord:: NONREACTIVE

## 2020-05-07 ENCOUNTER — Institutional Professional Consult (permissible substitution)
Admit: 2020-05-07 | Discharge: 2020-05-08 | Attending: Pharmacist Clinician (PhC)/ Clinical Pharmacy Specialist | Primary: Pharmacist Clinician (PhC)/ Clinical Pharmacy Specialist

## 2020-05-07 LAB — VITAMIN D, TOTAL (25OH): Lab: 18.7 — ABNORMAL LOW

## 2020-05-07 MED ORDER — DRONABINOL 5 MG CAPSULE
ORAL_CAPSULE | Freq: Two times a day (BID) | ORAL | 0 refills | 30 days | Status: CP
Start: 2020-05-07 — End: ?

## 2020-05-07 NOTE — Unmapped (Signed)
Clinical Pharmacist Practitioner: Neuro-Oncology Clinic - Telephone Encounter    I spent 20 minutes on the phone with the patient on the date of service. I spent an additional 15 minutes on pre- and post-visit activities on the date of service.     The patient was physically located in [place] or a state in which I am permitted to provide care. The patient and/or parent/guardian understood that s/he may incur co-pays and cost sharing, and agreed to the telemedicine visit. The visit was reasonable and appropriate under the circumstances given the patient's presentation at the time.    The patient and/or parent/guardian has been advised of the potential risks and limitations of this mode of treatment (including, but not limited to, the absence of in-person examination) and has agreed to be treated using telemedicine. The patient's/patient's family's questions regarding telemedicine have been answered.     If the visit was completed in an ambulatory setting, the patient and/or parent/guardian has also been advised to contact their provider???s office for worsening conditions, and seek emergency medical treatment and/or call 911 if the patient deems either necessary.    Patient Name: Darrell [name]  Patient Age: 34 y.o.    Assessment and recommendations:  Darrell [name] is a pleasant 34 y.o. 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 The patient initially came to medical attention presenting to the [facility] ED on 03/15/20 with a 3 week history of intractable headaches with associated photophobia, blurry and double vision, and nausea/vomiting. CT Head in the ED revealed a new large ill-defined 6.8 cm hemorrhagic masslike lesion in the right parietotemporal region with prominent surrounding vasogenic edema and mass effect with 1.0 cm leftward midline shift. He was offered direct admission to the [facility] Neuro ICU at that time however patient decided to go home to be with family before undergoing further workup. He subsequently went to [facility] the following day 03/16/20 where he recieved IV dexamethasone and had brain-lab guided MRI that confirmed medial right temporal lesion measuring 5.6 x 4.8 x 3.9 cmHGG with satellite lesions as well as a trapped R temporal horn. He is s/p brain-lab guided right temporal craniotomy for gross total resection of the tumor on 03/21/20 by Dr. Adriana Simas at [facility]. 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     1. Right Frontotemporal Glioblastoma (WHO Grade IV)- s/p gross total resection on 03/21/20 by Dr. Adriana Simas at [facility], MRI from [facility] w/ GTR. Started chemorads on 5/3 and after day 2 had a seizure. MRI at [facility] showed 1. Interval resection of right temporal lobe mass with areas of suspected tumor. Compared to preoperative MRI there are areas of improvement and areas of progression.??Interval enlargement of the anterior right temporal horn of the right lateral ventricle which may represent some degree of entrapment. Interval decrease in abnormal T2 signal, mass effect, and midline shift within the right temporal lobe.  CBC/diff and CMP on 5/6 WNL with exception of bilirubin.   - continue concurrent temozolomide 75 mg/m2 PO D1-42 while on radiotherapy  - ondansetron 8 mg 30 min prior to chemo and q8h as needed for nausea  - weekly CBCD and CMP while on chemoradiation  - Bactrim DS MWF while on chemoradiation  - MGMT status and TEMPUS sequencing pending  ??  2. Tumor-Related Headaches  - daily dull headaches with associated nausea, and dizziness.  - con't Tylenol PRN as needed  - on nortriptyline 50 mg  at bedtime and on propranolol 20 mg BID (for panic attacks)  - Has oxycodone 5 mg as needed for headaches, post discharge from [facility] ED  ??  3. Secondary Seizure disorder- has a seizure on 5/4 s/p day 2 of radiation. Went to [facility] ED, started on keppra. Not reported seizures since ER visit on 5/4. MRI findings above.   - On keppra 1000 mg BID   - Has follow up scheduled with [facility] neurosurgery on 05/09/20 for evaluation    4. Poor Appetite- reports a poor appetite which pre-dated starting his therapy but thinks it coincided with his diagnosis. Could be related to anxiety or treatment related. Cannot finish a meal, trying to eat small meals throughout the day.   - Start marinol 5 mg BID 30 min prior to meals    5. Anxiety/panic attacks- currently well controlled per patient report with xanax which he takes 2-3 times per day as needed and propranolol  - Continue xanax and propranolol as needed for panic attacks.     6. 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 This could be important to officially diagnose as patients with mutations of UGT1A1 can have life threating complications after receiving irinotecan and patients with homozygous UGT1A1*28 allele are at the greatest risk for complications.  - With irinotecan being a possible subsequent line therapy in GBM would recommend obtaining UGT1A1 DNA analysis     Follow- up: CPP f/u in 3 weeks, Dr. Theodoro Kalata with MRI/ labs on 06/26/20    ______________________________________________________________________    Reason for visit:  Temozolomide monitoring and side effect management    Current Drug and Dose: 75 mg/m2 daily x 42 days with concurrent radiation    Date of Initiation: 04/30/20, held 5/5 due to seizure on 5/4, resumed on 5/6    Oral Agent Toxicities:  Fatigue, mild nausea, appetite    Interval History:  Mr. Darrell [name] is feeling overall well, he reports being tired and having a slow recovery from his seizure. He reports ongoing muscle soreness but it is getting better. He has minimal nausea, he has been taking ondansetron 30 min prior to his chemotherapy and reports no other doses needed although he reports some mild nausea. He has a problem with appetite which predates his treatment but has been losing weight for about 1 year. He can't finish a meal which is frustrating for him, he really has no desire to eat. He forces himself to eat small meals throughout the day. He denies constipation and is having normal bowel movements. Denies any other side effects or symptoms.     Adherence: Takes 2 capsules 30 min after zofran 1 hour prior to radiation and qhs on weekends, Bactrim MWF, no missed doses     Drug Interactions: none    Oncology History Overview Note   Identifying Statement:  Darrell [name] is a 34 y.o. 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 Initially came to medical attention presenting to [facility] campus ED with a 3 week history of intractable headaches with associated photophobia, blurry vision, and nausea/vomiting.     Molecular Markers:  Ki67: pending  MGMT promotor: pending  IDH: wildtype  TERT promotor: mutated    Treatment History:  03/15/20: presented to [facility] ED w/ a 3 week history of intractable HAs. CT Head revealed new large ill-defined 6.8 cm hemorrhagic masslike lesion in the right parietotemporal  region with prominent surrounding vasogenic edema and mass effect with 1.0 cm leftward midline shift.   03/16/20: brain-lab guided MRI a [facility] confirmed medial right temporal lesion measuring 5.6 x 4.8 x 3.9 cmHGG with satellite lesions as well as a trapped R temporal horn.   03/21/20: s/p brain-lab guided right temporal craniotomy for resection by Dr. Adriana Simas at [facility]. 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  04/17/20: initial eval w/ [facility] for GBM; KPS 90; MRI w/ GTR; proceed w/ chemoRT; RTC 2-3 weeks post completion of chemoRT w/ MRI         Vital Signs for this encounter:  There were no vitals taken for this visit.  Wt Readings from Last 3 Encounters:   04/17/20 88.5 kg (195 lb 1.7 oz)   03/15/20 91.2 kg (201 lb)       Medications: Current Outpatient Medications   Medication Sig Dispense Refill   ??? acetaminophen (TYLENOL) 325 MG tablet Take 650 mg by mouth daily as needed for pain.     ??? ALPRAZolam (XANAX) 1 MG tablet Take 1 mg by mouth two (2) times a day as needed.      ??? baclofen (LIORESAL) 20 MG tablet Take 20 mg by mouth Three (3) times a day.     ??? cholecalciferol, vitamin D3-50 mcg, 2,000 unit,, (VITAMIN D3) 50 mcg (2,000 unit) cap Take 50 mcg by mouth daily.     ??? ibuprofen (ADVIL,MOTRIN) 200 MG tablet Take 600 mg by mouth daily as needed for pain.     ??? levETIRAcetam (KEPPRA) 1000 MG tablet Take 1,000 mg by mouth.     ??? nortriptyline (PAMELOR) 25 MG capsule 1 tab PO at bedtime x 1 week, then increase to 2 tabs PO qhs 60 capsule 5   ??? ondansetron (ZOFRAN-ODT) 8 MG disintegrating tablet Dissolve 1 tablet on the tongue 30 minutes before chemo and every 8 hours as needed for nausea. 60 tablet 10   ??? propranoloL (INDERAL) 20 MG tablet Take 20 mg by mouth Two (2) times a day.      ??? sulfamethoxazole-trimethoprim (BACTRIM DS) 800-160 mg per tablet Take 1 tablet by mouth on Mondays, Wednesdays, and Fridays while on radiation therapy. 20 tablet 0   ??? temozolomide (TEMODAR) 140 mg capsule Take 1 capsule (140mg ) by mouth along with one 20 mg capsule (total dose 160 mg) once daily 30 minutes after zofran 1 hr prior to radiation and at bedtime on weekends for 42 consecutive days starting on day 1 of radiation. 42 capsule 0   ??? temozolomide (TEMODAR) 20 mg capsule Take 1 capsule (20mg ) by mouth along with one 140 mg capsule (total dose 160 mg) once daily 30 minutes after zofran 1 hr prior to radiation and at bedtime on weekends for 42 consecutive days starting on day 1 of radiation. 42 capsule 0     No current facility-administered medications for this visit.       LABS:  Lab Results   Component Value Date    WBC 9.5 05/03/2020    HGB 14.6 05/03/2020    HCT 42.4 05/03/2020    PLT 239 05/03/2020       Lab Results   Component Value Date    NA 143 05/03/2020    NA 143 05/03/2020    K 4.3 05/03/2020    K 4.3 05/03/2020    CL 108 (H) 05/03/2020    CL 108 (H) 05/03/2020    CO2 29.7 05/03/2020  CO2 29.8 05/03/2020    BUN <5 (L) 05/03/2020    BUN <5 (L) 05/03/2020    CREATININE 0.70 05/03/2020    CREATININE 0.70 05/03/2020    GLU 89 05/03/2020    GLU 89 05/03/2020    CALCIUM 9.6 05/03/2020    CALCIUM 9.6 05/03/2020       Lab Results   Component Value Date    BILITOT 1.3 (H) 05/03/2020    BILITOT 1.3 (H) 05/03/2020    BILIDIR 0.40 03/15/2020    PROT 6.5 05/03/2020    PROT 6.5 05/03/2020    ALBUMIN 3.7 05/03/2020    ALBUMIN 3.7 05/03/2020    ALT 18 05/03/2020    ALT 18 05/03/2020    AST 20 05/03/2020    AST 21 05/03/2020    ALKPHOS 54 05/03/2020    ALKPHOS 53 05/03/2020     I spent 20 minutes in direct patient care.    Laverna Peace PharmD, BCOP, CPP  Hematology/Oncology Pharmacist  P: [phone]

## 2020-05-07 NOTE — Unmapped (Signed)
RADIATION TREATMENT MANAGEMENT NOTE     Encounter Date: 05/03/2020  Patient Name: Darrell [name]  Medical Record Number: [id number]    DIAGNOSIS:  34yo with a R frontotemporal GBM s/p resection (IDH WT, TERT promotor mutated, MGMT status pending)    ASSESSMENT: 600cGy of planned 6000cGy  Karnofsky/Lansky Performance Status: 90,  Able to carry on normal activity; minor signs or symptoms of disease (ECOG equivalent 0)  Chemotherapy/Systemic therapy:administered concurrently (TMZ)  Clinical Trial:   no    RECOMMENDATIONS:  1. Plan for Therapy: Continue treatment as planned.  Will check labs weekly while on TMZ.  2. Seizures:  On keppra now  3. Headaches:  Managed with OTC meds and prn oxycodone  4. Steroids:  Not taking any due to side effects when he first took them- seems like this was when he was taking 4mg  qid.  Given his headaches and new seizure activity, a low dose of steroids may be beneficial and discussed a trial of 2mg  qd    SUBJECTIVE:  Doing OK today.  Had seizure activity after his second RT treatment and was evaluated at [facility].  Imaging showed the known tumor and he was started on keppra.  Doing OK since then- still not taking any steroids.  He has persistent headaches- takes oxycodone for these sometimes.  No other neurologic issues.      PHYSICAL EXAM:  Vital Signs for this encounter:  There were no vitals taken for this visit.  Last weight:    Wt Readings from Last 4 Encounters:   04/17/20 88.5 kg (195 lb 1.7 oz)   03/15/20 91.2 kg (201 lb)     General:  Alert and Orientated X 3.  No acute distress.    Skin: None  Neuro:  No focal neuro deficits- CN III-XII grossly intact.    I have reviewed the patient's dose delivery, dosimetry, lab tests, patient treatment set-up, port films, treatment parameters and x-rays.    Cheral Almas, MD  Assistant Professor  [facility] Dept of Radiation Oncology  05/07/2020

## 2020-05-09 DIAGNOSIS — C719 Malignant neoplasm of brain, unspecified: Principal | ICD-10-CM

## 2020-05-09 LAB — COMPREHENSIVE METABOLIC PANEL
ALBUMIN: 3.5 g/dL (ref 3.4–5.0)
ALKALINE PHOSPHATASE: 56 U/L (ref 46–116)
ALT (SGPT): 21 U/L (ref 10–49)
ANION GAP: 5 mmol/L (ref 3–11)
AST (SGOT): 19 U/L (ref <34)
BILIRUBIN TOTAL: 0.7 mg/dL (ref 0.3–1.2)
BUN / CREAT RATIO: 6
CALCIUM: 9.2 mg/dL (ref 8.7–10.4)
CHLORIDE: 108 mmol/L — ABNORMAL HIGH (ref 98–107)
CO2: 28.9 mmol/L (ref 20.0–31.0)
EGFR CKD-EPI AA MALE: >90 mL/min/{1.73_m2}
EGFR CKD-EPI NON-AA MALE: >90 mL/min/{1.73_m2}
GLUCOSE RANDOM: 66 mg/dL — ABNORMAL LOW (ref 70–179)
POTASSIUM: 4.1 mmol/L (ref 3.5–5.1)
PROTEIN TOTAL: 6.4 g/dL (ref 5.7–8.2)
SODIUM: 142 mmol/L (ref 135–145)

## 2020-05-09 LAB — CBC W/ AUTO DIFF
BASOPHILS ABSOLUTE COUNT: 0.1 10*9/L (ref 0.0–0.1)
EOSINOPHILS ABSOLUTE COUNT: 0.8 10*9/L — ABNORMAL HIGH (ref 0.0–0.7)
EOSINOPHILS RELATIVE PERCENT: 7.3 %
HEMATOCRIT: 43.8 % (ref 38.0–50.0)
HEMOGLOBIN: 15.2 g/dL (ref 13.5–17.5)
LYMPHOCYTES ABSOLUTE COUNT: 2.3 10*9/L (ref 0.7–4.0)
LYMPHOCYTES RELATIVE PERCENT: 19.9 %
MEAN CORPUSCULAR HEMOGLOBIN CONC: 34.6 g/dL (ref 30.0–36.0)
MEAN CORPUSCULAR HEMOGLOBIN: 33 pg (ref 26.0–34.0)
MEAN CORPUSCULAR VOLUME: 95.4 fL — ABNORMAL HIGH (ref 81.0–95.0)
MEAN PLATELET VOLUME: 8.1 fL (ref 7.0–10.0)
MONOCYTES ABSOLUTE COUNT: 0.9 10*9/L (ref 0.1–1.0)
MONOCYTES RELATIVE PERCENT: 8.2 %
NEUTROPHILS ABSOLUTE COUNT: 7.3 10*9/L (ref 1.7–7.7)
NEUTROPHILS RELATIVE PERCENT: 63.9 %
NUCLEATED RED BLOOD CELLS: 0 /100{WBCs} (ref <=4)
PLATELET COUNT: 259 10*9/L (ref 150–450)
RED BLOOD CELL COUNT: 4.59 10*12/L (ref 4.32–5.72)
RED CELL DISTRIBUTION WIDTH: 12.9 % (ref 12.0–15.0)
WBC ADJUSTED: 11.4 10*9/L — ABNORMAL HIGH (ref 3.5–10.5)

## 2020-05-09 LAB — MONOCYTES ABSOLUTE COUNT: Monocytes:NCnc:Pt:Bld:Qn:Automated count: 0.9

## 2020-05-09 LAB — SODIUM: Sodium:SCnc:Pt:Ser/Plas:Qn:: 142

## 2020-05-09 NOTE — Unmapped (Signed)
05/09/2020    Dose Site Summary:  ZO:XWRUEAVW: 05/09/2020: 1,400/4,600 cGy  UJ:WJXBJYNW: : 0/1,400 cGy  GNF:AOZHY GB: 05/09/2020: 1,400 cGy  Subjective/Assessment/Recommendations:    1. Ataxia/balance: gait is steady  2. Confusion:none  3. Insomnia:trouble going to sleep but once asleep he stays asleep  4. Nausea/Vomiting:none  5. Nutrition:  6. Fatigue:some fatigue. Does not nap.   7. Pain: Headaches 6/10 helped with oxycodone twice a day.   8. Elimination demies ossies  9. Prescription Needs:  10. Psychosocial:  11. Other:

## 2020-05-09 NOTE — Unmapped (Signed)
Mr. Dowty called to report that he forgot to take his chemotherapy prior to radiation this morning. I instructed him to take it now and to resume his normal schedule tomorrow.    He also asked about his tempus results, which I let him know are still pending. Last he was curious if I could see the notes from the [facility] Neurosurgery visit today, he says he is not sure he understood what was discussed. I did not see the note posted to care everywhere yet. I encouraged him to contact the office and talk directly to them if he needed clarity on that discussion from today.    He was thankful for the information.    Laverna Peace PharmD, BCOP, CPP  Hematology/Oncology Pharmacist  P: [phone]

## 2020-05-09 NOTE — Unmapped (Signed)
RADIATION TREATMENT MANAGEMENT NOTE     Encounter Date: 05/09/2020  Patient Name: Darrell [name]  Medical Record Number: [id number]    DIAGNOSIS:  33yo with a R frontotemporal GBM s/p resection (IDH WT, TERT promotor mutated, MGMT status pending)    ASSESSMENT: 1400cGy of planned 6000cGy  Karnofsky/Lansky Performance Status: 90,  Able to carry on normal activity; minor signs or symptoms of disease (ECOG equivalent 0)  Chemotherapy/Systemic therapy:administered concurrently (TMZ)  Clinical Trial:   no    RECOMMENDATIONS:  1. Plan for Therapy: Continue treatment as planned.  Will check labs weekly while on TMZ.  2. Seizures:  Had a seizure after his second RT treatment.  On keppra now- no more seizure activity  3. Headaches:  Managed with OTC meds and prn oxycodone  4. Steroids:  Discussed re-starting a low dose of steroids (2mg  daily) which he did not start yet.  He had some concern due to side effects when he first took them- seems like this was when he was taking 4mg  qid.  Given his headaches and new seizure activity, a low dose of steroids may be beneficial    SUBJECTIVE:  Doing well this week.  No more seizure activity.  A little more fatigued and resting more but otherwise no new issues.  The headaches are still his main symptom- maybe a little worse than last week.  They occur mostly later in the day.  He has not started any steroids yet.  Headaches managed with oxycodone.      PHYSICAL EXAM:  Vital Signs for this encounter:  There were no vitals taken for this visit.  Last weight:    Wt Readings from Last 4 Encounters:   04/17/20 88.5 kg (195 lb 1.7 oz)   03/15/20 91.2 kg (201 lb)     General:  Alert and Orientated X 3.  No acute distress.    Skin: None  Neuro:  No focal neuro deficits- CN III-XII grossly intact.    I have reviewed the patient's dose delivery, dosimetry, lab tests, patient treatment set-up, port films, treatment parameters and x-rays.    Rayetta Humphrey, MD  Assistant Professor  [facility] Dept of Radiation Oncology  05/09/2020

## 2020-05-10 NOTE — Unmapped (Signed)
[facility] Clinical Assessment & Refill Coordination Note    Reminded Darrell to fill out PAP paperwork and submit soon.  PAP expires 05/18/20.  He will check with brother to see if they received forms. FU with Dr. Theodoro Kalata 06/26/20.    Darrell [name], DOB: 1986/09/21  Phone: [phone] (home)     All above HIPAA information was verified with patient.     Was a Nurse, learning disability used for this call? No    Specialty Medication(s):   Hematology/Oncology: Temozolomide 20mg  and Temozolomide 140mg      Current Outpatient Medications   Medication Sig Dispense Refill   ??? acetaminophen (TYLENOL) 325 MG tablet Take 650 mg by mouth daily as needed for pain.     ??? ALPRAZolam (XANAX) 1 MG tablet Take 1 mg by mouth two (2) times a day as needed.      ??? baclofen (LIORESAL) 20 MG tablet Take 20 mg by mouth Three (3) times a day.     ??? cholecalciferol, vitamin D3-50 mcg, 2,000 unit,, (VITAMIN D3) 50 mcg (2,000 unit) cap Take 50 mcg by mouth daily.     ??? dronabinoL (MARINOL) 5 MG capsule Take 1 capsule (5 mg total) by mouth Two (2) times a day (30 minutes before a meal). 60 capsule 0   ??? ibuprofen (ADVIL,MOTRIN) 200 MG tablet Take 600 mg by mouth daily as needed for pain.     ??? levETIRAcetam (KEPPRA) 1000 MG tablet Take 1,000 mg by mouth.     ??? nortriptyline (PAMELOR) 25 MG capsule 1 tab PO at bedtime x 1 week, then increase to 2 tabs PO qhs 60 capsule 5   ??? ondansetron (ZOFRAN-ODT) 8 MG disintegrating tablet Dissolve 1 tablet on the tongue 30 minutes before chemo and every 8 hours as needed for nausea. 60 tablet 10   ??? oxyCODONE (OXY-IR) 5 mg capsule Take 5 mg by mouth every four (4) hours as needed for pain.     ??? propranoloL (INDERAL) 20 MG tablet Take 20 mg by mouth Two (2) times a day.      ??? sulfamethoxazole-trimethoprim (BACTRIM DS) 800-160 mg per tablet Take 1 tablet by mouth on Mondays, Wednesdays, and Fridays while on radiation therapy. 20 tablet 0   ??? temozolomide (TEMODAR) 140 mg capsule Take 1 capsule (140mg ) by mouth along with one 20 mg capsule (total dose 160 mg) once daily 30 minutes after zofran 1 hr prior to radiation and at bedtime on weekends for 42 consecutive days starting on day 1 of radiation. 42 capsule 0   ??? temozolomide (TEMODAR) 20 mg capsule Take 1 capsule (20mg ) by mouth along with one 140 mg capsule (total dose 160 mg) once daily 30 minutes after zofran 1 hr prior to radiation and at bedtime on weekends for 42 consecutive days starting on day 1 of radiation. 42 capsule 0     No current facility-administered medications for this visit.        Changes to medications: Darrell reports no changes at this time.    No Known Allergies    Changes to allergies: No    SPECIALTY MEDICATION ADHERENCE     TMZ 140 mg: 16 days of medicine on hand   TMZ 20 mg: 16 days of medicine on hand     Medication Adherence    Patient reported X missed doses in the last month: 1  Specialty Medication: TMZ 140 mg and TMZ 20 mg  Patient is on additional specialty medications: No  Reasons for non-adherence: instructed by provider to hold or take differently  Confirmed plan for next specialty medication refill: delivery by pharmacy          Specialty medication(s) dose(s) confirmed: Regimen is correct and unchanged.     Are there any concerns with adherence? No    Adherence counseling provided? Not needed    CLINICAL MANAGEMENT AND INTERVENTION      Clinical Benefit Assessment:    Do you feel the medicine is effective or helping your condition? Yes    Clinical Benefit counseling provided? Not needed    Adverse Effects Assessment:    Are you experiencing any side effects? No    Are you experiencing difficulty administering your medicine? No    Quality of Life Assessment:    How many days over the past month did your glioblastoma  keep you from your normal activities? For example, brushing your teeth or getting up in the morning. 0    Have you discussed this with your provider? Not needed    Therapy Appropriateness:    Is therapy appropriate? Yes, therapy is appropriate and should be continued    DISEASE/MEDICATION-SPECIFIC INFORMATION      N/A    PATIENT SPECIFIC NEEDS     - Does the patient have any physical, cognitive, or cultural barriers? No    - Is the patient high risk? Yes, patient is taking oral chemotherapy. Appropriateness of therapy as been assessed.     - Does the patient require a Care Management Plan? No     - Does the patient require physician intervention or other additional services (i.e. nutrition, smoking cessation, social work)? No      SHIPPING     Specialty Medication(s) to be Shipped:   Hematology/Oncology: Temozolomide 20mg  and Temozolomide 140mg     Other medication(s) to be shipped: SMZ-TMP     Changes to insurance: No    Delivery Scheduled: Yes, Expected medication delivery date: 05/16/20.     Medication will be delivered via UPS to the confirmed prescription address in [facility].    The patient will receive a drug information handout for each medication shipped and additional FDA Medication Guides as required.  Verified that patient has previously received a Conservation officer, historic buildings.    All of the patient's questions and concerns have been addressed.    Breck Coons [facility] Specialty Pharmacist

## 2020-05-11 LAB — VITAMIN D, TOTAL (25OH): Lab: 18.5 — ABNORMAL LOW

## 2020-05-14 NOTE — Unmapped (Signed)
[name]  Oncology Outpatient Social Work      T/C to patient to check in on psychosocial needs    SSD/Medicaid App Update:  Pt states that he turned in all bank stmnts and other docs last week to social services Medicaid Caseworker, who said she would provide them to disability caseworker.    Psychosocial Update:  Pt's brother has used all his accrued PTO to stay with patient while hospitalized and to bring him to clinic appts. Now he is struggling with household bills.  Informed patient that he is eligible for financial assistance for household bills, including those in his brother's name since he lives with his brother now.  Pt is very appreciative and will email bills to this SW'er for review.    Follow-Up Plan:  Pt has this SW'ers contact information and will reach out for psychosocial assistance as needed.     Darrell [name], OSW-C  Oncology Outpatient Social Worker  Phone: [phone]

## 2020-05-15 ENCOUNTER — Telehealth
Admit: 2020-05-15 | Discharge: 2020-05-16 | Payer: PRIVATE HEALTH INSURANCE | Attending: Psychiatry | Primary: Psychiatry

## 2020-05-15 DIAGNOSIS — F411 Generalized anxiety disorder: Principal | ICD-10-CM

## 2020-05-15 DIAGNOSIS — G4700 Insomnia, unspecified: Principal | ICD-10-CM

## 2020-05-15 MED ORDER — TRAZODONE 50 MG TABLET
ORAL_TABLET | 1 refills | 0 days | Status: CP
Start: 2020-05-15 — End: ?

## 2020-05-15 MED FILL — TEMOZOLOMIDE 140 MG CAPSULE: 12 days supply | Qty: 12 | Fill #1 | Status: AC

## 2020-05-15 MED FILL — TEMOZOLOMIDE 20 MG CAPSULE: 12 days supply | Qty: 12 | Fill #1

## 2020-05-15 MED FILL — TEMOZOLOMIDE 20 MG CAPSULE: 12 days supply | Qty: 12 | Fill #1 | Status: AC

## 2020-05-15 MED FILL — TEMOZOLOMIDE 140 MG CAPSULE: 12 days supply | Qty: 12 | Fill #1

## 2020-05-15 MED FILL — SULFAMETHOXAZOLE 800 MG-TRIMETHOPRIM 160 MG TABLET: 17 days supply | Qty: 8 | Fill #1 | Status: AC

## 2020-05-15 MED FILL — SULFAMETHOXAZOLE 800 MG-TRIMETHOPRIM 160 MG TABLET: 17 days supply | Qty: 8 | Fill #1

## 2020-05-15 MED FILL — ONDANSETRON 8 MG DISINTEGRATING TABLET: 20 days supply | Qty: 60 | Fill #1 | Status: AC

## 2020-05-15 MED FILL — ONDANSETRON 8 MG DISINTEGRATING TABLET: 20 days supply | Qty: 60 | Fill #1

## 2020-05-15 NOTE — Unmapped (Signed)
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 Theodoro Kalata for evaluation of depression and anxiety.      Patient's presentation is largely consistent with his historical generalized anxiety disorder diagnosis and history of panic attacks.  At this time, symptoms seem relatively well-controlled on current regimen and though the patient states that he continues to have panic attacks, to the best of my understanding this refers to times when he is feeling noticeably anxious rather than actually having a panic attack.  With repeated clinical exam, will hone diagnostic impression, including r/o of panic disorder.  Primary complaint at this time is difficulty falling asleep, which seems to be exacerbated by anxiety and dexamethasone, but not exclusively driven by either.  Hesitant to titrate alprazolam further to address this and do not believe that increased nortriptyline dose would produce additional sleep benefit.  Therefore, will maintain current psychotropic regimen at current doses and add trazodone prn for insomnia.  RTC 6/21 at 12 PM via Epic video visit.    Risk Assessment:  A suicide and violence risk assessment was performed as part of this evaluation. There patient is deemed to be at chronic elevated risk for self-harm/suicide given the following factors: male age 58-35 and recent onset of serious medical condition. There patient is deemed to be at chronic elevated risk for violence given the following factors: male gender and younger age. These risk factors are mitigated by the following factors:lack of active SI/HI, no history of previous suicide attempts , utilization of positive coping skills, supportive family, sense of responsibility to family and social supports, minor children living at home, presence of an available support system, enjoyment of leisure actvities, expresses purpose for living, effective problem solving skills, safe housing and support system in agreement with treatment recommendations. There is no acute risk for suicide or violence at this time. The patient was educated about relevant modifiable risk factors including following recommendations for treatment of psychiatric illness and abstaining from substance abuse. While future psychiatric events cannot be accurately predicted, the patient does not currently require  acute inpatient psychiatric care and does not currently meet [facility] involuntary commitment criteria.      Diagnoses:   Patient Active Problem List   Diagnosis   ??? Glioblastoma (CMS-HCC)   ??? Anxiety        Stressors: CNS cancer, has not seen kids in almost 2 months, financial    Disability Assessment Scale: estimated as mild     Plan:  # Generalized Anxiety Disorder; r/o Panic Disorder  - Xanax 1 mg bid prn anxiety.  Patient utilizing 0.5 mg late morning after radiation and 0.5-1 mg nightly prior to bedtime  - Propranolol 20 bid  - Discussed utility of psychotherapy.  Patient potentially interested after completing chemoradiation and would like to explore AYA program.    # Insomnia  - START Trazodone 25-50 mg nightly prn insomnia  *Discussed risks, including but not limited to sedation, priapism, and serotonin syndrome, as well as anticipated benefit to sleep.  Patient expressed understanding and provided informed consent to starting trazodone for sleep  - Nortriptyline 50 mg nightly (  prescribed and helpful for headaches)  - Alprazolam as above    Follow-up appointment in 6/21 at 12 PM via Epic [place].    Revised Medication(s) Post Visit:  Outpatient Encounter Medications as of 05/15/2020   Medication Sig Dispense Refill   ??? acetaminophen (TYLENOL) 325 MG tablet Take 650 mg by mouth daily as needed for pain.     ??? ALPRAZolam (XANAX) 1 MG tablet Take 1 mg by mouth two (2) times a day as needed.      ??? baclofen (LIORESAL) 20 MG tablet Take 20 mg by mouth Three (3) times a day.     ??? cholecalciferol, vitamin D3-50 mcg, 2,000 unit,, (VITAMIN D3) 50 mcg (2,000 unit) cap Take 50 mcg by mouth daily.     ??? dronabinoL (MARINOL) 5 MG capsule Take 1 capsule (5 mg total) by mouth Two (2) times a day (30 minutes before a meal). 60 capsule 0   ??? ibuprofen (ADVIL,MOTRIN) 200 MG tablet Take 600 mg by mouth daily as needed for pain.     ??? levETIRAcetam (KEPPRA) 1000 MG tablet Take 1,000 mg by mouth.     ??? nortriptyline (PAMELOR) 25 MG capsule 1 tab PO at bedtime x 1 week, then increase to 2 tabs PO qhs 60 capsule 5   ??? ondansetron (ZOFRAN-ODT) 8 MG disintegrating tablet Dissolve 1 tablet on the tongue 30 minutes before chemo and every 8 hours as needed for nausea. 60 tablet 10   ??? oxyCODONE (OXY-IR) 5 mg capsule Take 5 mg by mouth every four (4) hours as needed for pain.     ??? propranoloL (INDERAL) 20 MG tablet Take 20 mg by mouth Two (2) times a day.      ??? sulfamethoxazole-trimethoprim (BACTRIM DS) 800-160 mg per tablet Take 1 tablet by mouth on Mondays, Wednesdays, and Fridays while on radiation therapy. 20 tablet 0   ??? temozolomide (TEMODAR) 140 mg capsule Take 1 capsule (140mg ) by mouth along with one 20 mg capsule (total dose 160 mg) once daily 30 minutes after zofran 1 hr prior to radiation and at bedtime on weekends for 42 consecutive days starting on day 1 of radiation. 42 capsule 0   ??? temozolomide (TEMODAR) 20 mg capsule Take 1 capsule (20mg ) by mouth along with one 140 mg capsule (total dose 160 mg) once daily 30 minutes after zofran 1 hr prior to radiation and at bedtime on weekends for 42 consecutive days starting on day 1 of radiation. 42 capsule 0     No facility-administered encounter medications on file as of 05/15/2020.       Patient was provided with my contact information. He/she was instructed to call [phone] for emergencies.     Thank you for allowing me to participate in the care of this patient.    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 Theodoro Kalata for evaluation of depression and anxiety.      Patient describes relatively good control of anxiety over the last several weeks, managed through relatively long-time regimen of propranolol and alprazolam.  Says that he utilizes the alprazolam every day after radiation, when he reliably feels like anxiety is worse, and before bed.  Elaborates that he often talks with kids before bed and finds it distressing that he has not  seen them in so long (last just prior to his craniotomy).  Says that his anxiety (historically and in the present) is a combination of worries and physical sensation (most namely, chest tightening).  Identifies as having panic attacks.  Says lately these have been a few times per day, but clarifies that this is less frequent than in the past, and that these do not necessary involve full on panic symptoms but rather inflections in anxiety.  Has noticed anxiety provoked by labs/test results as well as conversations in which others contextualize his illness and prognosis as part of religion/God's plan/being in God's hands.  In general, feels very supported by friends and family, and brother in particular.  Finds time with friends helpful in both talking through his emotions and distracting himself from his current situation.  Describes other coping skills as playing video games (which has been harder to do lately w/ blurred vision and impaired coordination) and watching/rewatching TV and movies.     Denies feeling depressed.  Denies SI, elaborating that this suicide would be selfish and not fair to all those supporting him.  Does identify, instead, with feeling angry - why am I going through this bullshit? but feels very motivated to exhaust all potential treatment options.  He adds that even if certain treatments are not helpful to him, he wants to contribute to medical knowledge and help future patients with GBM live longer.  Does state that in the last 1-2 months, sleep has been worse.  Takes 2-3 hours to fall asleep.  Over this period, will usually take 0.5 mg Alprazolam followed 1-2 hr later by another dose of 0.5 mg Alprazolam.  Listens to audiobooks to help self unwind.  Has good quality, undisturbed sleep once he falls asleep, but wakes up naturally by 6 AM (5-6 hr total sleep).  Feels tired during the day.  Has not noticed improvement in sleep with starting nortriptyline and feels like sleep has worsened somewhat since starting dexamethasone.    Regarding other symptoms, admits to [facility] of a cat in weeks leading up to his surgery.  This has resolved, bust still some nondescript patterns in the periphery of his vision that seem to move a bit.  Denies AH.  Denies overt confusion.  Admits to drinking 2 beers at a time a few times per week and uses a vape pen (unclear if for cannabis, nicotine, or both).  Denies other drug use or identifying with overusing any substance in the past.    Allergies:  Patient has no known allergies.    Medications:   Current Outpatient Medications   Medication Sig Dispense Refill   ??? acetaminophen (TYLENOL) 325 MG tablet Take 650 mg by mouth daily as needed for pain.     ??? ALPRAZolam (XANAX) 1 MG tablet Take 1 mg by mouth two (2) times a day as needed.      ??? baclofen (LIORESAL) 20 MG tablet Take 20 mg by mouth Three (3) times a day.     ??? cholecalciferol, vitamin D3-50 mcg, 2,000 unit,, (VITAMIN D3) 50 mcg (2,000 unit) cap Take 50 mcg by mouth daily.     ??? dronabinoL (MARINOL) 5 MG capsule Take 1 capsule (5 mg total) by mouth Two (2) times a day (30 minutes before a meal). 60 capsule 0   ??? ibuprofen (ADVIL,MOTRIN) 200 MG tablet Take 600 mg by mouth daily as needed for pain.     ??? levETIRAcetam (KEPPRA) 1000 MG tablet Take 1,000 mg by mouth.     ???  nortriptyline (PAMELOR) 25 MG capsule 1 tab PO at bedtime x 1 week, then increase to 2 tabs PO qhs 60 capsule 5   ??? ondansetron (ZOFRAN-ODT) 8 MG disintegrating tablet Dissolve 1 tablet on the tongue 30 minutes before chemo and every 8 hours as needed for nausea. 60 tablet 10   ??? oxyCODONE (OXY-IR) 5 mg capsule Take 5 mg by mouth every four (4) hours as needed for pain.     ??? propranoloL (INDERAL) 20 MG tablet Take 20 mg by mouth Two (2) times a day.      ??? sulfamethoxazole-trimethoprim (BACTRIM DS) 800-160 mg per tablet Take 1 tablet by mouth on Mondays, Wednesdays, and Fridays while on radiation therapy. 20 tablet 0   ??? temozolomide (TEMODAR) 140 mg capsule Take 1 capsule (140mg ) by mouth along with one 20 mg capsule (total dose 160 mg) once daily 30 minutes after zofran 1 hr prior to radiation and at bedtime on weekends for 42 consecutive days starting on day 1 of radiation. 42 capsule 0   ??? temozolomide (TEMODAR) 20 mg capsule Take 1 capsule (20mg ) by mouth along with one 140 mg capsule (total dose 160 mg) once daily 30 minutes after zofran 1 hr prior to radiation and at bedtime on weekends for 42 consecutive days starting on day 1 of radiation. 42 capsule 0     No current facility-administered medications for this visit.       Past Psychiatric History:  Past diagnoses: GAD, panic attacks  Substance abuse history: Denies.   Hospitalizations: Denies  Self harm: Denies SIB  Suicide: Denies attempts  Past medication trials: Busprione (up to 15 tid, hangover feeling), fluoxetine (headaches), duloxetine or sertraline (no benefit), Clonazepam (cloudy headed, sedated)  Outpatient providers: No previous mental health specialty care    Past Medical History:  Past Medical History:   Diagnosis Date   ??? Migraine    Previous irregular tachycardia (and underlying ?cardiomyopathy).  Treated previously with pharmacotherapy but told he no longer needed.    Surgical History:  No past surgical history on file.    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  Has 2 kids (ages 2 and 54) who live in [place] area with their mother.  Access to firearm: Brother has a firearm, but patient unaware of where it is located.  Legal: Not asked  Abuse: Not asked    Family History:  The patient's family history is not on file..    ROS: All systems reviewed are negative except as described in the HPI.    Objective:     Vitals:   BP Readings from Last 3 Encounters:   03/15/20 140/106   02/17/20 142/89   02/05/20 147/99       Pulse Readings from Last 3 Encounters:   03/15/20 62   02/05/20 81       Mental Status Exam:  Appearance:    Appears stated age   Motor:   No abnormal movements   Speech/Language:    Normal rate, volume, tone, fluency   Mood:   ok   Affect:    Constricted, but mood reactive   Thought process:   Logical, linear, clear, coherent, goal directed   Thought content:     Denies SI, HI, self harm, delusions, obsessions, paranoid ideation, or ideas of reference   Perceptual disturbances:  Denies auditory and visual hallucinations, behavior not concerning for response to internal stimuli     Orientation:   Oriented to person, place, time, and general circumstances   Attention:   Able to fully attend without fluctuations in consciousness   Concentration:   Able to fully concentrate and attend   Memory:   Immediate, short-term, long-term, and recall grossly intact    Fund of knowledge:    Consistent with level of education and development   Insight:     Fair   Judgment:    Fair   Impulse Control:   Fair     PE:   General: in NAD  Pulm: No increased work of breathing  Neuro: Face symmetric.  No tics, tremors, other abnormal movements.    Test Results:  Data Review:   Recent Results (from the past 672 hour(s))   Creatinine, Whole Blood    Collection Time: 04/20/20  3:02 PM   Result Value Ref Range    Creatinine WB 0.8 0.8 - 1.4 mg/dL    EGFR CKD-EPI Non-African American, Male (Whole Blood) >90 >=60 mL/min/1.9m2    EGFR CKD-EPI African American, Male (Whole Blood) >90 >=60 mL/min/1.62m2   MGMT DNA Analysis    Collection Time: 05/02/20 11:32 AM   Result Value Ref Range    Collection Collected    MGMT DNA Analysis    Collection Time: 05/02/20 11:32 AM   Result Value Ref Range    Case Report       Molecular Genetics Report                         Case: [id number]                                 Authorizing Provider:  Maxine Glenn, MD            Collected:           05/02/2020 1132              Ordering Location:     [facility]   Received:            05/02/2020 1136                                     [place]                                                                  Pathologist:           Hardie Shackleton, MD                                                     Specimen:    Brain, [id number]  MGMT DNA Analysis Results       SPECIMEN:  Brain, right frontotemporal lesion, resection: Unstained slides/block labeled [id number] block A1 are received from [facility] (corresponding to [phone] from [facility]) with an original collection date of 03/21/20 and a 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  Areas selected for macrodissection contain approximately 90% tumor nuclei.    RESULT:  No significant MGMT promoter methylation identified.    INTERPRETATION:  Unmethylated MGMT in glioblastoma is associated with a worse prognosis and diminished likelihood of response to alkylating agents such as temozolomide compared to glioblastoma with hypermethylated MGMT. REFERENCE:  Erskine Emery et al, J Clin Oncol. 40:3474, 2009; [employer] Reviews Neurology 6: 39-51 2010.    METHOD:  DNA was modified using sodium bisulfite to differentiate methylated and unmethylated cytosines, followed by PCR and pyrosequencing of the MGMT gene promoter (PyroMark MGMT reagents, QIAGEN). The reference range for this test is: Negative for MGMT methylation.        This test was developed and its performance characteristics determined by the [facility].  It has not been approved by the [place] Food and Drug Administration.  However, such approval is not required for clinical implementation, and test results have been shown to be clinically useful.  This laboratory is CAP accredited and CLIA certified to perform high complexity testing.     Comprehensive Metabolic Panel    Collection Time: 05/03/20 10:46 AM   Result Value Ref Range    Sodium 143 135 - 145 mmol/L    Potassium 4.3 3.5 - 5.1 mmol/L    Chloride 108 (H) 98 - 107 mmol/L    Anion Gap 5 3 - 11 mmol/L    CO2 29.7 20.0 - 31.0 mmol/L    BUN <5 (L) 9 - 23 mg/dL    Creatinine 2.59 5.63 - 1.10 mg/dL    EGFR CKD-EPI Non-African American, Male >90 mL/min/1.36m2    EGFR CKD-EPI African American, Male >90 mL/min/1.16m2    Glucose 89 70 - 179 mg/dL    Calcium 9.6 8.7 - 87.5 mg/dL    Albumin 3.7 3.4 - 5.0 g/dL    Total Protein 6.5 5.7 - 8.2 g/dL    Total Bilirubin 1.3 (H) 0.3 - 1.2 mg/dL    AST 20 <64 U/L    ALT 18 10 - 49 U/L    Alkaline Phosphatase 54 46 - 116 U/L   Comprehensive Metabolic Panel    Collection Time: 05/03/20 10:46 AM   Result Value Ref Range    Sodium 143 135 - 145 mmol/L    Potassium 4.3 3.5 - 5.1 mmol/L    Chloride 108 (H) 98 - 107 mmol/L    Anion Gap 5 3 - 11 mmol/L    CO2 29.8 20.0 - 31.0 mmol/L    BUN <5 (L) 9 - 23 mg/dL    Creatinine 3.32 9.51 - 1.10 mg/dL    EGFR CKD-EPI Non-African American, Male >90 mL/min/1.77m2    EGFR CKD-EPI African American, Male >90 mL/min/1.55m2    Glucose 89 70 - 179 mg/dL Calcium 9.6 8.7 - 88.4 mg/dL    Albumin 3.7 3.4 - 5.0 g/dL    Total Protein 6.5 5.7 - 8.2 g/dL    Total Bilirubin 1.3 (H) 0.3 - 1.2 mg/dL    AST 21 <16 U/L    ALT 18 10 - 49 U/L    Alkaline Phosphatase 53 46 - 116 U/L   Vitamin D 25 Hydroxy (  25OH D2 + D3)    Collection Time: 05/03/20 10:46 AM   Result Value Ref Range    Vitamin D Total (25OH) 18.7 (L) 20.0 - 80.0 ng/mL   Hepatitis B Surface Antibody    Collection Time: 05/03/20 10:46 AM   Result Value Ref Range    Hep B S Ab Nonreactive Nonreactive, Grayzone    Hep B Surf Ab Quant <8.00 <8.00 m(IU)/mL   Hepatitis B Core Antibody, total    Collection Time: 05/03/20 10:46 AM   Result Value Ref Range    Hep B Core Total Ab Nonreactive Nonreactive   Hepatitis C Antibody    Collection Time: 05/03/20 10:46 AM   Result Value Ref Range    Hepatitis C Ab Nonreactive Nonreactive   Hepatitis B Surface Antigen    Collection Time: 05/03/20 10:46 AM   Result Value Ref Range    Hep B Surface Ag Nonreactive Nonreactive   HIV Antigen/Antibody Combo    Collection Time: 05/03/20 10:46 AM   Result Value Ref Range    HIV Antigen/Antibody Combo Nonreactive Nonreactive   CBC w/ Differential    Collection Time: 05/03/20 10:46 AM   Result Value Ref Range    WBC 9.5 3.5 - 10.5 10*9/L    RBC 4.47 4.32 - 5.72 10*12/L    HGB 14.6 13.5 - 17.5 g/dL    HCT 95.6 21.3 - 08.6 %    MCV 94.8 81.0 - 95.0 fL    MCH 32.6 26.0 - 34.0 pg    MCHC 34.4 30.0 - 36.0 g/dL    RDW 57.8 46.9 - 62.9 %    MPV 8.2 7.0 - 10.0 fL    Platelet 239 150 - 450 10*9/L    nRBC 0 <=4 /100 WBCs    Neutrophils % 75.1 %    Lymphocytes % 14.9 %    Monocytes % 7.5 %    Eosinophils % 2.2 %    Basophils % 0.3 %    Absolute Neutrophils 7.1 1.7 - 7.7 10*9/L    Absolute Lymphocytes 1.4 0.7 - 4.0 10*9/L    Absolute Monocytes 0.7 0.1 - 1.0 10*9/L    Absolute Eosinophils 0.2 0.0 - 0.7 10*9/L    Absolute Basophils 0.0 0.0 - 0.1 10*9/L   Comprehensive Metabolic Panel    Collection Time: 05/09/20 10:23 AM   Result Value Ref Range    Sodium 142 135 - 145 mmol/L    Potassium 4.1 3.5 - 5.1 mmol/L    Chloride 108 (H) 98 - 107 mmol/L    Anion Gap 5 3 - 11 mmol/L    CO2 28.9 20.0 - 31.0 mmol/L    BUN 5 (L) 9 - 23 mg/dL    Creatinine 5.28 4.13 - 1.10 mg/dL    BUN/Creatinine Ratio 6     EGFR CKD-EPI Non-African American, Male >90 mL/min/1.55m2    EGFR CKD-EPI African American, Male >90 mL/min/1.99m2    Glucose 66 (L) 70 - 179 mg/dL    Calcium 9.2 8.7 - 24.4 mg/dL    Albumin 3.5 3.4 - 5.0 g/dL    Total Protein 6.4 5.7 - 8.2 g/dL    Total Bilirubin 0.7 0.3 - 1.2 mg/dL    AST 19 <01 U/L    ALT 21 10 - 49 U/L    Alkaline Phosphatase 56 46 - 116 U/L   Vitamin D 25 Hydroxy (25OH D2 + D3)    Collection Time: 05/09/20 10:23 AM   Result Value Ref Range  Vitamin D Total (25OH) 18.5 (L) 20.0 - 80.0 ng/mL   CBC w/ Differential    Collection Time: 05/09/20 10:23 AM   Result Value Ref Range    WBC 11.4 (H) 3.5 - 10.5 10*9/L    RBC 4.59 4.32 - 5.72 10*12/L    HGB 15.2 13.5 - 17.5 g/dL    HCT 16.1 09.6 - 04.5 %    MCV 95.4 (H) 81.0 - 95.0 fL    MCH 33.0 26.0 - 34.0 pg    MCHC 34.6 30.0 - 36.0 g/dL    RDW 40.9 81.1 - 91.4 %    MPV 8.1 7.0 - 10.0 fL    Platelet 259 150 - 450 10*9/L    nRBC 0 <=4 /100 WBCs    Neutrophils % 63.9 %    Lymphocytes % 19.9 %    Monocytes % 8.2 %    Eosinophils % 7.3 %    Basophils % 0.7 %    Absolute Neutrophils 7.3 1.7 - 7.7 10*9/L    Absolute Lymphocytes 2.3 0.7 - 4.0 10*9/L    Absolute Monocytes 0.9 0.1 - 1.0 10*9/L    Absolute Eosinophils 0.8 (H) 0.0 - 0.7 10*9/L    Absolute Basophils 0.1 0.0 - 0.1 10*9/L     Imaging: Radiology report(s) reviewed.      Horton Marshall, MD  05/15/2020

## 2020-05-15 NOTE — Unmapped (Signed)
Brain Tumor Program - Neuropsychiatry Outpatient Clinic After Visit Summary  It was a pleasure to see you today in the [facility] Brain Tumor Program's Neuropsychiatry Clinic!    Recommendations:  1.) START Trazodone 25-50 mg nightly as needed for sleep  2.) Otherwise, continue medications as currently prescribed    To schedule, cancel, or change your appointment:  Pllease call the [facility] schedulers at [phone], Monday through Friday 8AM - 5PM.  Someone will return your call within 24 hours.    If you have a question about your medicines or you need to contact your provider:   Please call the [phone].  You will be directed to a Waynetta Sandy who will either be able to address your question directly or contact me for further clarification.      You can always contact me directly through My [facility] Chart.  I will respond to you within 2 business days.    For prescription refills, please allow at least 24 hours (during business hours, M-F) for providers to call in refills to your pharmacy. We are generally unable to accommodate same-day requests for refills.     If you are taking any controlled substances (such as anxiety or sleep medications), you must use them as the directions say to use them. We generally do not provide early refills over the phone without clear reason, and it would be inappropriate to obtain the medications from other doctors. We routinely use the [place] controlled substance database to monitor prescription drug use.

## 2020-05-16 DIAGNOSIS — C719 Malignant neoplasm of brain, unspecified: Principal | ICD-10-CM

## 2020-05-16 LAB — CBC W/ AUTO DIFF
BASOPHILS ABSOLUTE COUNT: 0 10*9/L (ref 0.0–0.1)
BASOPHILS RELATIVE PERCENT: 0.3 %
EOSINOPHILS ABSOLUTE COUNT: 0.1 10*9/L (ref 0.0–0.7)
EOSINOPHILS RELATIVE PERCENT: 0.9 %
HEMATOCRIT: 45.4 % (ref 38.0–50.0)
HEMOGLOBIN: 15.4 g/dL (ref 13.5–17.5)
LYMPHOCYTES ABSOLUTE COUNT: 1.7 10*9/L (ref 0.7–4.0)
LYMPHOCYTES RELATIVE PERCENT: 11.7 %
MEAN CORPUSCULAR HEMOGLOBIN CONC: 33.8 g/dL (ref 30.0–36.0)
MEAN CORPUSCULAR HEMOGLOBIN: 32.9 pg (ref 26.0–34.0)
MEAN CORPUSCULAR VOLUME: 97.4 fL — ABNORMAL HIGH (ref 81.0–95.0)
MEAN PLATELET VOLUME: 8.4 fL (ref 7.0–10.0)
MONOCYTES ABSOLUTE COUNT: 1 10*9/L (ref 0.1–1.0)
MONOCYTES RELATIVE PERCENT: 6.6 %
NEUTROPHILS ABSOLUTE COUNT: 11.7 10*9/L — ABNORMAL HIGH (ref 1.7–7.7)
NEUTROPHILS RELATIVE PERCENT: 80.5 %
PLATELET COUNT: 303 10*9/L (ref 150–450)
RED BLOOD CELL COUNT: 4.66 10*12/L (ref 4.32–5.72)
RED CELL DISTRIBUTION WIDTH: 13.2 % (ref 12.0–15.0)

## 2020-05-16 LAB — COMPREHENSIVE METABOLIC PANEL
ALBUMIN: 3.6 g/dL (ref 3.4–5.0)
ALKALINE PHOSPHATASE: 54 U/L (ref 46–116)
ANION GAP: 7 mmol/L (ref 3–11)
AST (SGOT): 17 U/L (ref <34)
BILIRUBIN TOTAL: 0.6 mg/dL (ref 0.3–1.2)
BLOOD UREA NITROGEN: <5 mg/dL — ABNORMAL LOW (ref 9–23)
CALCIUM: 9.2 mg/dL (ref 8.7–10.4)
CHLORIDE: 105 mmol/L (ref 98–107)
CO2: 28.5 mmol/L (ref 20.0–31.0)
CREATININE: 0.73 mg/dL (ref 0.60–1.10)
EGFR CKD-EPI AA MALE: >90 mL/min/{1.73_m2}
EGFR CKD-EPI NON-AA MALE: >90 mL/min/{1.73_m2}
GLUCOSE RANDOM: 78 mg/dL (ref 70–179)
POTASSIUM: 3.7 mmol/L (ref 3.5–5.1)
PROTEIN TOTAL: 6.7 g/dL (ref 5.7–8.2)

## 2020-05-16 LAB — BASOPHILS RELATIVE PERCENT: Basophils/100 leukocytes:NFr:Pt:Bld:Qn:Automated count: 0.3

## 2020-05-16 LAB — EGFR CKD-EPI AA MALE: Glomerular filtration rate/1.73 sq M.predicted.black:ArVRat:Pt:Ser/Plas/Bld:Qn:Creatinine-based formula (CKD-EPI): >90

## 2020-05-16 MED ORDER — DEXAMETHASONE 2 MG TABLET
ORAL_TABLET | Freq: Two times a day (BID) | ORAL | 0 refills | 30 days | Status: CP
Start: 2020-05-16 — End: 2021-05-16

## 2020-05-16 NOTE — Unmapped (Signed)
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 Plan for Therapy: Continue treatment as planned.  Will check labs weekly while on TMZ.  2. Seizures:  Had a seizure after his second RT treatment.  On keppra now- no more seizure activity  3. Headaches:  Managed with OTC meds and prn oxycodone.  Frequency improved with low dose of steroids  4. Steroids:  He has restarted and taking 2mg  BID.  Discussed that he can decrease to 2mg  every day if he prefers and see if this still helps with headaches while minimizing impact on sleep.  5. Sleep issues:  Likely multifactorial- started trazodone yesterday so we will see if this can help.  Steroids as above.  Following with psych    SUBJECTIVE:  Overall doing well this week.  Started Dex 2mg  BID and this has helped with the frequency of the headaches, although when they do occur they can still be severe.  More fatigued and having problems sleeping still.  He saw psychiatry yesterday who believed his sleep issues were multifactorial and started him on trazodone- took his first dose last night and he didn't notice much difference.  He is still active during the day- taking care of his niece and nephew during the day.  No more seizures.    PHYSICAL EXAM:  Vital Signs for this encounter:  There were no vitals taken for this visit.  Last weight:    Wt Readings from Last 4 Encounters:   04/17/20 88.5 kg (195 lb 1.7 oz)   03/15/20 91.2 kg (201 lb)     General:  Alert and Orientated X 3.  No acute distress.    Skin: None  Neuro:  No focal neuro deficits- CN III-XII grossly intact.    I have reviewed the patient's dose delivery, dosimetry, lab tests, patient treatment set-up, port films, treatment parameters and x-rays.    Rayetta Humphrey, MD  Assistant Professor  [facility] Dept of Radiation Oncology  05/16/2020

## 2020-05-16 NOTE — Unmapped (Signed)
05/16/2020    Dose Site Summary:  JW:JXBJYNWG: 05/16/2020: 2,400/4,600 cGy  NF:AOZHYQMV: : 0/1,400 cGy  HQI:ONGEX GB: 05/16/2020: 2,400 cGy  Subjective/Assessment/Recommendations:    1. Ataxia/balance:no issues  2. Confusion:none  3. Insomnia:has started trazadone at night to help sleep.   4. Nausea/Vomiting:none  5. Nutrition:  6. Fatigue: has noted increased fatigue.   7. Pain: nortriptyline helps in the morning after taking it at night but nothing really helps during the day. Dexamethasone has helped the frequency but not the intensity.   8. Elimination denies  9. Prescription Needs:  10. Psychosocial:  11. Other:

## 2020-05-18 LAB — VITAMIN D, TOTAL (25OH): Lab: 20

## 2020-05-18 NOTE — Unmapped (Signed)
[facility]  Oncology Outpatient Social Work      Reason for Contact:    Psychosocial Update and Case Management:  T/C from patient.  He is requesting assistance with social security disability process. They have all information needed from him, including medical records, and it has been a month that he is waiting, and he is wondering if they need something more from his providers.  Discussed with patient that Dr. Theodoro Kalata could potentially complete a SSD Physician Statement. Pt feels this would be helpful.  Sent SSD Physician Statement Template to Dr. Theodoro Kalata to complete on patient's behalf.     Follow-Up Plan:  Pt has this SW'ers contact information and will reach out for psychosocial assistance as needed.     Claris Gladden, OSW-C  Oncology Outpatient Social Worker  Phone: [phone]

## 2020-05-21 NOTE — Unmapped (Unsigned)
Darrell [name] is a 34 y.o. male  with glioblastoma who presents for evaluation of vision loss.     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    - Patient states that since surgery, his vision both eyes has been blurry with current glasses, and images look like they are moving.  Gets daily headaches.  No double vision.     Referral: Dr. Maxine Glenn, MD     MRI Brain and Orbits: 03/19/20    IMPRESSION:   1. ??Interval postsurgical changes of right temporal lobe mass resection.   Areas of nodular enhancement about the resection cavity may represent   residual tumor.   2. ??Significant interval decrease in size of the temporal horn of the right   lateral ventricle. Slight decrease in size of the temporal horn of the left   lateral ventricle.   3. ??Unchanged 1.1 cm of leftward midline shift and right uncal herniation.        Previous images:  None      Today's Exam:  Images (April 23, 2020):    HVF OU: right quadrantanopia   OCT RNFL OU: WNL OU  OCT GCC OU: mildly decreased    Assessment and plan:   - Vision loss in a patient with glioblastoma. Today in the office there are   Deceased HVF, and some mild changes in OCT [facility]. Will floow in 1 month after chemotherapy.   - Follow up in 1 month  - I have spent more than 50% of the provider's face-to-face visit time with a patient is spent in counseling or coordination of care.  - I was present and involved in all parts of the service and the documentation is mine.   - I instructed patient to call [facility] at [phone] for questions/concerns or report immediately to the Emergency department.  - CC referring provider

## 2020-05-23 DIAGNOSIS — C719 Malignant neoplasm of brain, unspecified: Principal | ICD-10-CM

## 2020-05-23 LAB — CBC W/ AUTO DIFF
BASOPHILS ABSOLUTE COUNT: 0.1 10*9/L (ref 0.0–0.1)
BASOPHILS RELATIVE PERCENT: 0.5 %
EOSINOPHILS ABSOLUTE COUNT: 0.4 10*9/L (ref 0.0–0.7)
EOSINOPHILS RELATIVE PERCENT: 2.3 %
HEMATOCRIT: 46.7 % (ref 38.0–50.0)
HEMOGLOBIN: 16 g/dL (ref 13.5–17.5)
LYMPHOCYTES RELATIVE PERCENT: 15.5 %
MEAN CORPUSCULAR HEMOGLOBIN CONC: 34.3 g/dL (ref 30.0–36.0)
MEAN CORPUSCULAR HEMOGLOBIN: 33.1 pg (ref 26.0–34.0)
MEAN CORPUSCULAR VOLUME: 96.6 fL — ABNORMAL HIGH (ref 81.0–95.0)
MEAN PLATELET VOLUME: 8.3 fL (ref 7.0–10.0)
MONOCYTES RELATIVE PERCENT: 6.8 %
NEUTROPHILS ABSOLUTE COUNT: 12.6 10*9/L — ABNORMAL HIGH (ref 1.7–7.7)
NEUTROPHILS RELATIVE PERCENT: 74.9 %
NUCLEATED RED BLOOD CELLS: 0 /100{WBCs} (ref <=4)
PLATELET COUNT: 255 10*9/L (ref 150–450)
RED BLOOD CELL COUNT: 4.83 10*12/L (ref 4.32–5.72)
RED CELL DISTRIBUTION WIDTH: 13.2 % (ref 12.0–15.0)

## 2020-05-23 LAB — COMPREHENSIVE METABOLIC PANEL
ALT (SGPT): 20 U/L (ref 10–49)
ANION GAP: 5 mmol/L (ref 3–11)
AST (SGOT): 20 U/L (ref <34)
BILIRUBIN TOTAL: 0.7 mg/dL (ref 0.3–1.2)
BLOOD UREA NITROGEN: <5 mg/dL — ABNORMAL LOW (ref 9–23)
CALCIUM: 9.7 mg/dL (ref 8.7–10.4)
CHLORIDE: 107 mmol/L (ref 98–107)
CO2: 29.2 mmol/L (ref 20.0–31.0)
CREATININE: 0.73 mg/dL (ref 0.60–1.10)
EGFR CKD-EPI AA MALE: >90 mL/min/{1.73_m2}
EGFR CKD-EPI NON-AA MALE: >90 mL/min/{1.73_m2}
GLUCOSE RANDOM: 83 mg/dL (ref 70–179)
POTASSIUM: 4.2 mmol/L (ref 3.5–5.1)
PROTEIN TOTAL: 6.8 g/dL (ref 5.7–8.2)
SODIUM: 141 mmol/L (ref 135–145)

## 2020-05-23 LAB — ALBUMIN: Albumin:MCnc:Pt:Ser/Plas:Qn:: 3.7

## 2020-05-23 LAB — NEUTROPHILS RELATIVE PERCENT: Neutrophils/100 leukocytes:NFr:Pt:Bld:Qn:Automated count: 74.9

## 2020-05-23 NOTE — Unmapped (Signed)
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 Plan for Therapy: Continue treatment as planned.  Will check labs weekly while on TMZ.  2. Seizures:  Had a seizure after his second RT treatment.  On keppra now- no more seizure activity  3. Headaches:  Managed with OTC meds and prn oxycodone.  Frequency improved with low dose of steroids  4. Steroids:  He has restarted and taking 2mg  BID and this is helping a lot with headaches  5. Sleep issues:  Likely multifactorial- trazodone is helping a lot    SUBJECTIVE:  Feels well this week.  The headaches are much better- still has them but not nearly as intense as before.  His sleep is great with trazodone.  Some hair loss but not bothering him too much.  He had a good weekend with his kids and with his niece/nephew- good energy and able to keep up with them.  Had a few episodes of nausea but managed with zofran.    PHYSICAL EXAM:  Vital Signs for this encounter:  There were no vitals taken for this visit.  Last weight:    Wt Readings from Last 4 Encounters:   04/17/20 88.5 kg (195 lb 1.7 oz)   03/15/20 91.2 kg (201 lb)     General:  Alert and Orientated X 3.  No acute distress.    Skin: Hair loss in R temporal areatwis  Neuro:  No focal neuro deficits- CN III-XII grossly intact.    I have reviewed the patient's dose delivery, dosimetry, lab tests, patient treatment set-up, port films, treatment parameters and x-rays.    Rayetta Humphrey, MD  Assistant Professor  [facility] Dept of Radiation Oncology 05/23/2020

## 2020-05-23 NOTE — Unmapped (Signed)
05/23/2020    Dose Site Summary:  QM:VHQIONGE: 05/23/2020: 3,400/4,600 cGy  XB:MWUXLKGM: : 0/1,400 cGy  WNU:UVOZD GB: 05/23/2020: 3,400 cG  Subjective/Assessment/Recommendations:    1. Ataxia/balance:No issues  2. Confusion: no issues  3. Insomnia: trazadone helps sleeping.   4. Nausea/Vomiting: vomited once after radiation treatment. Has only needed zofran 3 x in the last week.  5. Nutrition: eating well.   6. Fatigue:remains a problem.   7. Pain: pain in back   8. Elimination  9. Prescription Needs:  10. Psychosocial:  11. Other:

## 2020-05-24 LAB — VITAMIN D, TOTAL (25OH): Lab: 20.1

## 2020-05-24 NOTE — Unmapped (Unsigned)
[place] Luu is a 34 y.o. male  with glioblastoma who presents for evaluation of vision loss.     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    - Patient states that since surgery, his vision both eyes has been blurry with current glasses, and images look like they are moving.  Gets daily headaches.  No double vision.     Referral: Dr. Maxine Glenn, MD     MRI Brain and Orbits: 03/19/20    IMPRESSION:   1. ??Interval postsurgical changes of right temporal lobe mass resection.   Areas of nodular enhancement about the resection cavity may represent   residual tumor.   2. ??Significant interval decrease in size of the temporal horn of the right   lateral ventricle. Slight decrease in size of the temporal horn of the left   lateral ventricle.   3. ??Unchanged 1.1 cm of leftward midline shift and right uncal herniation.        Previous images:  None      Today's Exam:  Images (April 23, 2020):    HVF OU: right quadrantanopia   OCT RNFL OU: WNL OU  OCT GCC OU: mildly decreased    Assessment and plan:   - Vision loss in a patient with glioblastoma. Today in the office there are   Deceased HVF, and some mild changes in OCT [facility]. Will floow in 1 month after chemotherapy.   - Follow up in 1 month  - I have spent more than 50% of the provider's face-to-face visit time with a patient is spent in counseling or coordination of care.  - I was present and involved in all parts of the service and the documentation is mine.   - I instructed patient to call [facility] at [phone] for questions/concerns or report immediately to the Emergency department.  - CC referring provider

## 2020-05-29 ENCOUNTER — Encounter: Admit: 2020-05-29 | Discharge: 2020-06-27 | Payer: PRIVATE HEALTH INSURANCE

## 2020-05-29 ENCOUNTER — Ambulatory Visit: Admit: 2020-05-29 | Discharge: 2020-06-27 | Payer: PRIVATE HEALTH INSURANCE

## 2020-05-29 ENCOUNTER — Encounter
Admit: 2020-05-29 | Discharge: 2020-06-27 | Payer: PRIVATE HEALTH INSURANCE | Attending: Radiation Oncology | Primary: Radiation Oncology

## 2020-05-30 DIAGNOSIS — C719 Malignant neoplasm of brain, unspecified: Principal | ICD-10-CM

## 2020-05-30 LAB — COMPREHENSIVE METABOLIC PANEL
ALBUMIN: 3.8 g/dL (ref 3.4–5.0)
ALKALINE PHOSPHATASE: 44 U/L — ABNORMAL LOW (ref 46–116)
ALT (SGPT): 26 U/L (ref 10–49)
ANION GAP: <3 mmol/L — ABNORMAL LOW (ref 3–11)
AST (SGOT): 14 U/L (ref <34)
BILIRUBIN TOTAL: 0.8 mg/dL (ref 0.3–1.2)
BLOOD UREA NITROGEN: 5 mg/dL — ABNORMAL LOW (ref 9–23)
BUN / CREAT RATIO: 7
CALCIUM: 9.7 mg/dL (ref 8.7–10.4)
CHLORIDE: 106 mmol/L (ref 98–107)
CO2: 33.3 mmol/L — ABNORMAL HIGH (ref 20.0–31.0)
CREATININE: 0.75 mg/dL (ref 0.60–1.10)
EGFR CKD-EPI AA MALE: >90 mL/min/{1.73_m2}
EGFR CKD-EPI NON-AA MALE: >90 mL/min/{1.73_m2}
POTASSIUM: 4.7 mmol/L (ref 3.5–5.1)
PROTEIN TOTAL: 7 g/dL (ref 5.7–8.2)
SODIUM: 139 mmol/L (ref 135–145)

## 2020-05-30 LAB — CBC W/ AUTO DIFF
BASOPHILS ABSOLUTE COUNT: 0.1 10*9/L (ref 0.0–0.1)
BASOPHILS RELATIVE PERCENT: 0.3 %
EOSINOPHILS ABSOLUTE COUNT: 0.1 10*9/L (ref 0.0–0.7)
EOSINOPHILS RELATIVE PERCENT: 0.4 %
HEMATOCRIT: 47.8 % (ref 38.0–50.0)
HEMOGLOBIN: 16.2 g/dL (ref 13.5–17.5)
LYMPHOCYTES ABSOLUTE COUNT: 1.1 10*9/L (ref 0.7–4.0)
LYMPHOCYTES RELATIVE PERCENT: 6.2 %
MEAN CORPUSCULAR HEMOGLOBIN CONC: 33.8 g/dL (ref 30.0–36.0)
MEAN CORPUSCULAR VOLUME: 97.9 fL — ABNORMAL HIGH (ref 81.0–95.0)
MONOCYTES ABSOLUTE COUNT: 1.5 10*9/L — ABNORMAL HIGH (ref 0.1–1.0)
MONOCYTES RELATIVE PERCENT: 8.3 %
NEUTROPHILS ABSOLUTE COUNT: 15 10*9/L — ABNORMAL HIGH (ref 1.7–7.7)
NEUTROPHILS RELATIVE PERCENT: 84.8 %
NUCLEATED RED BLOOD CELLS: 0 /100{WBCs} (ref <=4)
PLATELET COUNT: 358 10*9/L (ref 150–450)
RED BLOOD CELL COUNT: 4.88 10*12/L (ref 4.32–5.72)
RED CELL DISTRIBUTION WIDTH: 13.5 % (ref 12.0–15.0)
WBC ADJUSTED: 17.7 10*9/L — ABNORMAL HIGH (ref 3.5–10.5)

## 2020-05-30 LAB — BILIRUBIN TOTAL: Bilirubin:MCnc:Pt:Ser/Plas:Qn:: 0.8

## 2020-05-30 LAB — LYMPHOCYTES ABSOLUTE COUNT: Lymphocytes:NCnc:Pt:Bld:Qn:Automated count: 1.1

## 2020-05-30 NOTE — Unmapped (Signed)
05/30/2020    Dose Site Summary:  WN:UUVOZDGU: 05/30/2020: 4,200/4,600 cGy  YQ:IHKVQQVZ: : 0/1,400 cGy  DGL:OVFIE GB: 05/30/2020: 4,200 cGy  Subjective/Assessment/Recommendations:    1. Ataxia/balance:no  Issues   2. Confusion:none  3. Insomnia: sleeping well at night.   4. Nausea/Vomiting:none  5. Nutrition:  6. Fatigue:energy is better in the afternnon.   7. Pain:none since meds started-only needs ibuprofen every few days.   8. Elimination  9. Prescription Needs:  10. Psychosocial:  11. Other:

## 2020-05-30 NOTE — Unmapped (Signed)
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 Plan for Therapy: Continue treatment as planned.  Will check labs weekly while on TMZ.  2. Seizures:  Had a seizure after his second RT treatment.  On keppra now- no more seizure activity  3. Headaches:  Managed with OTC meds and prn oxycodone.  Frequency improved with low dose of steroids  4. Steroids:  He has restarted and taking 2mg  BID and this is helping a lot with headaches  5. Sleep issues:  Likely multifactorial- trazodone is helping a lot    SUBJECTIVE:  Doing well today and feels good.  Headaches are actually better this week and sleep is still good as long as he is taking trazodone.  No more seizures, no other focal neuro deficits.  He got some molecular testing back from [facility] and is curious about the results- meeting with Dr. Theodoro Kalata in a few weeks to discuss.  Shaved some of his hair off but no skin reaction.  Planning a trip to the beach with some friends    PHYSICAL EXAM:  Vital Signs for this encounter:  There were no vitals taken for this visit.  Last weight:    Wt Readings from Last 4 Encounters:   04/17/20 88.5 kg (195 lb 1.7 oz)   03/15/20 91.2 kg (201 lb)     General:  Alert and Orientated X 3.  No acute distress.    Skin: No erythema over the R temporal area  Neuro:  No focal neuro deficits- CN III-XII grossly intact.    I have reviewed the patient's dose delivery, dosimetry, lab tests, patient treatment set-up, port films, treatment parameters and x-rays.    Rayetta Humphrey, MD Assistant Professor  [facility] Dept of Radiation Oncology  05/30/2020

## 2020-06-03 NOTE — Unmapped (Signed)
Hem/Onc Phone Triage Note    Caller: patient, Darrell [name]    Reason for Call:   Darrell [name] is a 34 y.o. M with R frontotemporal GBM s/p resection currently undergoing chemoRT. Primary oncologists: Dr. Lillia Carmel (RadOnc) + Dr. Eugenia Pancoast (MedOnc).     Assessment/Plan:   Darrell [name] called to report he forgot to take his dose of temozolomide yesterday. He asked if he should take a double dose today. I told him just to get back on his regular schedule of dosing today and yesterday will be considered a missed dose.     Message routed to his outpatient care team.     Fellow Taking Call:  Linden Dolin  June 03, 2020 8:26 AM

## 2020-06-04 LAB — VITAMIN D, TOTAL (25OH): Lab: 21.5

## 2020-06-04 MED ORDER — LEVETIRACETAM 1,000 MG TABLET
ORAL_TABLET | Freq: Two times a day (BID) | ORAL | 3 refills | 90 days | Status: CP
Start: 2020-06-04 — End: 2021-06-04

## 2020-06-04 MED ORDER — DRONABINOL 5 MG CAPSULE
ORAL_CAPSULE | Freq: Three times a day (TID) | ORAL | 0 refills | 30.00000 days | Status: CP
Start: 2020-06-04 — End: ?

## 2020-06-04 NOTE — Unmapped (Signed)
Hi,     [place] contacted the [employer] regarding the following:    - Called request requesting a refill of Keppra/     Please contact [place] at [phone].    Thanks in advance,    Drema Balzarine  [facility]   [phone]

## 2020-06-04 NOTE — Unmapped (Signed)
Darrell [name] is a 34 y.o. 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 He is currently undergoing treatment with temozolomide and radiation.     [name] called to report that he is running low on his levetiracetam. He also reports that the dronabinol was really helping his appetite and on occasion he will take three per day with each meal and is also running low on this.     Last he said he has been having some more intense headaches. He says the nortriptyline did help at first and improved his headaches but now he is developing some more migraine type headaches. He is taking nortriptyline 50 mg daily and also on propranolol 20 mg BID for anxiety.     I refilled his levetiracetam and dronabinol. I let him know to keep track of headaches and discuss with Dr. Theodoro Kalata at next visit. He is supposed to have a visit with Dr. Theodoro Kalata this week or next to discuss his prognosis in more depth. We are working to arrange. He does have follow up post chemoRT on 6/29.    Laverna Peace PharmD, BCOP, CPP  Hematology/Oncology Pharmacist  P: [phone]

## 2020-06-05 DIAGNOSIS — G43809 Other migraine, not intractable, without status migrainosus: Principal | ICD-10-CM

## 2020-06-05 MED ORDER — BUTALBITAL-ACETAMINOPHEN-CAFFEINE 50 MG-325 MG-40 MG TABLET
ORAL_TABLET | ORAL | 3 refills | 20 days | Status: CP | PRN
Start: 2020-06-05 — End: ?

## 2020-06-05 NOTE — Unmapped (Signed)
Spoke with patient.    We discussed that Dr. Theodoro Kalata would prefer to see an MRI before speaking with him about the results of his tumor markers/genetic testing.  He understands but would just like a quick discussion and keep appointment at the end of the month.    I will see if we have any time to fit him in on the schedule today, but if not I will call him later on.  He appreciates the efforts we have made to try and work him in.

## 2020-06-06 DIAGNOSIS — C719 Malignant neoplasm of brain, unspecified: Principal | ICD-10-CM

## 2020-06-06 LAB — COMPREHENSIVE METABOLIC PANEL
ALBUMIN: 3.3 g/dL — ABNORMAL LOW (ref 3.4–5.0)
ALKALINE PHOSPHATASE: 35 U/L — ABNORMAL LOW (ref 46–116)
ANION GAP: <3 mmol/L — ABNORMAL LOW (ref 3–11)
AST (SGOT): 17 U/L (ref <34)
BILIRUBIN TOTAL: 1.1 mg/dL (ref 0.3–1.2)
BLOOD UREA NITROGEN: 7 mg/dL — ABNORMAL LOW (ref 9–23)
BUN / CREAT RATIO: 8
CALCIUM: 9.1 mg/dL (ref 8.7–10.4)
CHLORIDE: 108 mmol/L — ABNORMAL HIGH (ref 98–107)
CO2: 32.8 mmol/L — ABNORMAL HIGH (ref 20.0–31.0)
CREATININE: 0.9 mg/dL (ref 0.60–1.10)
EGFR CKD-EPI AA MALE: >90 mL/min/{1.73_m2}
EGFR CKD-EPI NON-AA MALE: >90 mL/min/{1.73_m2}
GLUCOSE RANDOM: 86 mg/dL (ref 70–179)
POTASSIUM: 4.3 mmol/L (ref 3.5–5.1)
PROTEIN TOTAL: 6.2 g/dL (ref 5.7–8.2)
SODIUM: 141 mmol/L (ref 135–145)

## 2020-06-06 LAB — CBC W/ AUTO DIFF
BASOPHILS RELATIVE PERCENT: 0.5 %
EOSINOPHILS ABSOLUTE COUNT: 0.4 10*9/L (ref 0.0–0.7)
EOSINOPHILS RELATIVE PERCENT: 2.1 %
HEMATOCRIT: 46.9 % (ref 38.0–50.0)
HEMOGLOBIN: 15.7 g/dL (ref 13.5–17.5)
LYMPHOCYTES ABSOLUTE COUNT: 2.5 10*9/L (ref 0.7–4.0)
LYMPHOCYTES RELATIVE PERCENT: 12.9 %
MEAN CORPUSCULAR HEMOGLOBIN: 32.8 pg (ref 26.0–34.0)
MEAN CORPUSCULAR VOLUME: 97.7 fL — ABNORMAL HIGH (ref 81.0–95.0)
MEAN PLATELET VOLUME: 7.7 fL (ref 7.0–10.0)
MONOCYTES RELATIVE PERCENT: 6.5 %
NEUTROPHILS ABSOLUTE COUNT: 14.8 10*9/L — ABNORMAL HIGH (ref 1.7–7.7)
NEUTROPHILS RELATIVE PERCENT: 78 %
NUCLEATED RED BLOOD CELLS: 0 /100{WBCs} (ref <=4)
PLATELET COUNT: 282 10*9/L (ref 150–450)
RED BLOOD CELL COUNT: 4.79 10*12/L (ref 4.32–5.72)
RED CELL DISTRIBUTION WIDTH: 13.5 % (ref 12.0–15.0)
WBC ADJUSTED: 19 10*9/L — ABNORMAL HIGH (ref 3.5–10.5)

## 2020-06-06 LAB — SODIUM: Sodium:SCnc:Pt:Ser/Plas:Qn:: 141

## 2020-06-06 LAB — LYMPHOCYTES ABSOLUTE COUNT: Lymphocytes:NCnc:Pt:Bld:Qn:Automated count: 2.5

## 2020-06-06 MED ORDER — SILDENAFIL 50 MG TABLET
ORAL_TABLET | Freq: Every day | ORAL | 2 refills | 20.00000 days | Status: CP | PRN
Start: 2020-06-06 — End: 2021-06-06

## 2020-06-06 NOTE — Unmapped (Signed)
06/06/2020    Dose Site Summary:  ZO:XWRUEAVW: 06/01/2020: 4,600/4,600 cGy  UJ:WJXBJYNW: 06/06/2020: 600/1,400 cGy  GNF:AOZHY GB: 06/06/2020: 5,200 cGy  Subjective/Assessment/Recommendations:    1. Ataxia/balance:  2. Confusion:  3. Insomnia: Has been waking up at 5 am and unable to go back to sleep. I s sleeping 6 hours a night.   4. Nausea/Vomiting: none  5. Nutrition: I s eating well.  6. Fatigue: increasing fatigue.   7. Pain: Has started esgic for headaches that is helping   8. Elimination  9. Prescription Needs:  10. Psychosocial:  11. Other:

## 2020-06-07 NOTE — Unmapped (Signed)
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 Plan for Therapy: Continue treatment as planned.  Will check labs weekly while on TMZ.  2. Seizures:  Had a seizure after his second RT treatment.  On keppra now- no more seizure activity  3. Headaches: Improving with Esgic.  Frequency improved with low dose of steroids  4. Steroids:  He has restarted and taking 2mg  BID and this is helping a lot with headaches- will taper after treatment completion  5. Sleep issues:  Likely multifactorial- trazodone is helping a lot  6. ED:  Prescribed viagra per patient request    SUBJECTIVE:  Feels well today- he started Esgic for his headaches and this further helped with severity.  Still having some sleeping problems.  No more seizures or other issues.  Overall feels well and trying to stay positive    PHYSICAL EXAM:  Vital Signs for this encounter:  There were no vitals taken for this visit.  Last weight:    Wt Readings from Last 4 Encounters:   04/17/20 88.5 kg (195 lb 1.7 oz)   03/15/20 91.2 kg (201 lb)     General:  Alert and Orientated X 3.  No acute distress.    Skin: No erythema over the R temporal area  Neuro:  No focal neuro deficits- CN III-XII grossly intact.    I have reviewed the patient's dose delivery, dosimetry, lab tests, patient treatment set-up, port films, treatment parameters and x-rays.    Cheral Almas, MD  Assistant Professor  [facility] Dept of Radiation Oncology  06/06/2020

## 2020-06-11 LAB — VITAMIN D, TOTAL (25OH): Lab: 20.5

## 2020-06-11 NOTE — Unmapped (Signed)
[facility] Pharmacist Intervention    Type of intervention: Darrell [name] reports not having TMZ for last radiation session scheduled for 06/12/20    Medication: Temozolomide 140 mg + 20 mg    Problem: Pt is 1 day short of 42 day course of TMZ.  Will not have a dose for 06/12/20.   When asked pt reported that he there was one day where he took his dose but radiation was rescheduled.  He has completed 42 days of TMZ but due to the rescheduling he does not have a dose for his last cycle.    Intervention: Darrell [name] was counseled that since he has completed all 42 capsules it is ok if he does not have a dose for tomorrow.  He can continue with his radiation without taking TMZ    Follow up needed: none - this encounter will be routed to Darrell [name], CPP    Approximate time spent: 5 minutes    Caylan Chenard [facility] Specialty Pharmacist

## 2020-06-12 DIAGNOSIS — C719 Malignant neoplasm of brain, unspecified: Principal | ICD-10-CM

## 2020-06-12 LAB — CBC W/ AUTO DIFF
BASOPHILS ABSOLUTE COUNT: 0 10*9/L (ref 0.0–0.1)
BASOPHILS RELATIVE PERCENT: 0.3 %
EOSINOPHILS ABSOLUTE COUNT: 0.4 10*9/L (ref 0.0–0.7)
EOSINOPHILS RELATIVE PERCENT: 3.3 %
HEMATOCRIT: 49.3 % (ref 38.0–50.0)
HEMOGLOBIN: 17 g/dL (ref 13.5–17.5)
LYMPHOCYTES RELATIVE PERCENT: 8.4 %
MEAN CORPUSCULAR HEMOGLOBIN CONC: 34.5 g/dL (ref 30.0–36.0)
MEAN CORPUSCULAR HEMOGLOBIN: 33.6 pg (ref 26.0–34.0)
MEAN PLATELET VOLUME: 8.1 fL (ref 7.0–10.0)
MONOCYTES ABSOLUTE COUNT: 0.9 10*9/L (ref 0.1–1.0)
MONOCYTES RELATIVE PERCENT: 7.4 %
NEUTROPHILS ABSOLUTE COUNT: 10.1 10*9/L — ABNORMAL HIGH (ref 1.7–7.7)
NUCLEATED RED BLOOD CELLS: 0 /100{WBCs} (ref <=4)
PLATELET COUNT: 247 10*9/L (ref 150–450)
RED BLOOD CELL COUNT: 5.06 10*12/L (ref 4.32–5.72)
RED CELL DISTRIBUTION WIDTH: 13.3 % (ref 12.0–15.0)
WBC ADJUSTED: 12.5 10*9/L — ABNORMAL HIGH (ref 3.5–10.5)

## 2020-06-12 LAB — COMPREHENSIVE METABOLIC PANEL
ALBUMIN: 3.5 g/dL (ref 3.4–5.0)
ALT (SGPT): 26 U/L (ref 10–49)
ANION GAP: 3 mmol/L (ref 3–11)
AST (SGOT): 22 U/L (ref <34)
BILIRUBIN TOTAL: 0.9 mg/dL (ref 0.3–1.2)
BLOOD UREA NITROGEN: <5 mg/dL — ABNORMAL LOW (ref 9–23)
CALCIUM: 9.3 mg/dL (ref 8.7–10.4)
CHLORIDE: 107 mmol/L (ref 98–107)
CO2: 28.3 mmol/L (ref 20.0–31.0)
CREATININE: 0.82 mg/dL (ref 0.60–1.10)
EGFR CKD-EPI AA MALE: >90 mL/min/{1.73_m2}
EGFR CKD-EPI NON-AA MALE: >90 mL/min/{1.73_m2}
POTASSIUM: 4.3 mmol/L (ref 3.5–5.1)
PROTEIN TOTAL: 7.1 g/dL (ref 5.7–8.2)

## 2020-06-12 LAB — EOSINOPHILS ABSOLUTE COUNT: Eosinophils:NCnc:Pt:Bld:Qn:Automated count: 0.4

## 2020-06-12 LAB — AST (SGOT): Aspartate aminotransferase:CCnc:Pt:Ser/Plas:Qn:: 22

## 2020-06-12 NOTE — Unmapped (Signed)
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 Plan for Therapy: Continue treatment as planned.  Will check labs weekly while on TMZ.  2. Seizures:  Had a seizure after his second RT treatment.  On keppra now- no more seizure activity  3. Headaches: Worse over the past week after improving with Esgic.  I'll increase his steroids a little in case this is treatment-related but will plan to taper thereafter- taper provided in clinic.  4. Steroids:  He has restarted and taking 2mg  BID and this is helping a lot with headaches- will taper after treatment completion  5. Sleep issues:  Likely multifactorial- trazodone is helping a lot  6. ED:  Prescribed viagra per patient request  7. Follow-up:  Sees Dr. Theodoro Kalata in 2 weeks to discuss additional adjuvant treatment    SUBJECTIVE:  Doing OK today.  He's had more issues with headaches over the past few days similar to his pre-operative headaches.  Associated with some emesis.  His current medication regimen (nortriptyline, Esgic) is not helping.  No more seizures or other neurologic issues.  Excited to be done with treatment.    PHYSICAL EXAM:  Vital Signs for this encounter:  There were no vitals taken for this visit.  Last weight:    Wt Readings from Last 4 Encounters:   04/17/20 88.5 kg (195 lb 1.7 oz)   03/15/20 91.2 kg (201 lb)     General:  Alert and Orientated X 3.  No acute distress.    Skin: No erythema over the R temporal area  Neuro:  No focal neuro deficits- CN III-XII grossly intact.    I have reviewed the patient's dose delivery, dosimetry, lab tests, patient treatment set-up, port films, treatment parameters and x-rays.    Rayetta Humphrey, MD  Assistant Professor  [facility] Dept of Radiation Oncology  06/12/2020

## 2020-06-12 NOTE — Unmapped (Signed)
06/12/2020    Dose Site Summary:    Subjective/Assessment/Recommendations:    1. Ataxia/balance: no issues  2. Confusion:no noted by family  3. Insomnia: no sleeping due to headache.   4. Nausea/Vomiting: headaches so bad he is vomiting.   5. Nutrition:   6. Fatigue: due to not sleeping  7. Pain:headaches are unmanaged and causing vomiting.   8. Elimination  9. Prescription Needs:  10. Psychosocial:  11. Other:

## 2020-06-14 ENCOUNTER — Ambulatory Visit: Admit: 2020-06-14 | Discharge: 2020-06-15 | Payer: PRIVATE HEALTH INSURANCE

## 2020-06-14 NOTE — Unmapped (Signed)
[facility]  Oncology Outpatient Social Work     [employer] App Update and Case Management:  Rec'd completed and signed physician disability form from Purcell Mouton, RN and Dr. Theodoro Kalata, MD NeuroOnc.  Provided form to patient for his disability application.     Follow-Up Plan:  Pt has this SW'ers contact information and will reach out for psychosocial assistance as needed.     Claris Gladden, OSW-C  Oncology Outpatient Social Worker  Phone: [phone]

## 2020-06-14 NOTE — Unmapped (Signed)
Darrell [name] is a 34 y.o. male  with glioblastoma who presents for evaluation of vision loss.     CC: vision loss both eyes      Timeline:   - ~few months -   - 04/16/2020 - MRI brain and orbits done and Dx tumor     - Patient is s/p gross total resection on 03/21/20 of right Frontotemporal Glioblastoma Dr. Adriana Simas at [facility] did sx   - Patient states that since surgery, his vision both eyes has been blurry with current glasses, and images look like they are moving.  Gets daily headaches.  No double vision.     Referral: Dr. Maxine Glenn, MD     MRI Brain and Orbits: 03/19/20    IMPRESSION:   1. ??Interval postsurgical changes of right temporal lobe mass resection.   Areas of nodular enhancement about the resection cavity may represent   residual tumor.   2. ??Significant interval decrease in size of the temporal horn of the right   lateral ventricle. Slight decrease in size of the temporal horn of the left   lateral ventricle.   3. ??Unchanged 1.1 cm of leftward midline shift and right uncal herniation.        Previous images:  None      Today's Exam:  Images (06/14/20):    HVF OU: right quadrantanopia   OCT RNFL OU: WNL OU  OCT GCC OU: mildly decreased, stable     Assessment and plan:   - Vision loss in a patient with glioblastoma. Today in the office there are   Deceased HVF, and some mild changes in OCT [facility]. Will floow in 1 month after chemotherapy.   - Follow up in 1 month  - I have spent more than 50% of the provider's face-to-face visit time with a patient is spent in counseling or coordination of care which is more then 45 minutes.   - I was present and involved in all parts of the service and the documentation is mine.   - I instructed patient to call [facility] at [phone] for questions/concerns or report immediately to the Emergency department.  - CC referring provider

## 2020-06-15 LAB — VITAMIN D, TOTAL (25OH): Lab: 28

## 2020-06-18 NOTE — Unmapped (Signed)
Spoke with patient.    Dr. Theodoro Kalata has recommended he come to [facility] ED to have MRI.  We are worried about disease progression.    He agrees and will have his brother bring him to [facility].

## 2020-06-18 NOTE — Unmapped (Signed)
Spoke with patient.    He is having constant headaches across the front right side of head.  Last week had a headache four days plus vomiting.  He states it feels like pressure like a bruise.  When he touches that area it is sore.  He is still taking nortriptyline, Fioricet, and ibuprofen as needed.  There is some relief after medications but not total relief.    He is also on 4 mg BID dexamethasone.  This was started a few weeks ago by Dr. Carles Collet.      He has MRI scheduled next week 6/28.  I advised that if he has intractable headache plus vomiting again he should go to ED.

## 2020-06-18 NOTE — Unmapped (Unsigned)
**I have not yet completed my evaluation of this patient or documentation related to our encounter.  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 Theodoro Kalata for evaluation of depression and anxiety.      Patient's presentation is largely consistent with his historical generalized anxiety disorder diagnosis and history of panic attacks.  At this time, symptoms seem relatively well-controlled on current regimen and though the patient states that he continues to have panic attacks, to the best of my understanding this refers to times when he is feeling noticeably anxious rather than actually having a panic attack.  With repeated clinical exam, will hone diagnostic impression, including r/o of panic disorder.  Primary complaint at this time is difficulty falling asleep, which seems to be exacerbated by anxiety and dexamethasone, but not exclusively driven by either.  Hesitant to titrate alprazolam further to address this and do not believe that increased nortriptyline dose would produce additional sleep benefit.  Therefore, will maintain current psychotropic regimen at current doses and add trazodone prn for insomnia.  RTC *** at 12 PM via Epic video visit.    Risk Assessment:  A suicide and violence risk assessment was performed as part of this evaluation. There patient is deemed to be at chronic elevated risk for self-harm/suicide given the following factors: male age 88-35 and recent onset of serious medical condition. There patient is deemed to be at chronic elevated risk for violence given the following factors: male gender and younger age. These risk factors are mitigated by the following factors:lack of active SI/HI, no history of previous suicide attempts , utilization of positive coping skills, supportive family, sense of responsibility to family and social supports, minor children living at home, presence of an available support system, enjoyment of leisure actvities, expresses purpose for living, effective problem solving skills, safe housing and support system in agreement with treatment recommendations. There is no acute risk for suicide or violence at this time. The patient was educated about relevant modifiable risk factors including following recommendations for treatment of psychiatric illness and abstaining from substance abuse. While future psychiatric events cannot be accurately predicted, the patient does not currently require  acute inpatient psychiatric care and does not currently meet [facility] involuntary commitment criteria.      Diagnoses:   Patient Active Problem List   Diagnosis   ??? Glioblastoma (CMS-HCC)   ??? Anxiety        Stressors: CNS cancer, has not seen kids in almost 2 months, financial    Disability Assessment Scale: estimated as mild     Plan:  # Generalized Anxiety Disorder; r/o Panic Disorder  - Xanax 1 mg bid prn anxiety.  Patient utilizing 0.5 mg late morning after radiation and 0.5-1 mg nightly prior to bedtime  - Propranolol 20 mg bid  - Discussed utility of psychotherapy.  Patient potentially interested after completing chemoradiation and would like to explore AYA program.    # Insomnia  - Trazodone 25-50 mg nightly prn insomnia  - Nortriptyline 50 mg  nightly (prescribed and helpful for headaches)  - Alprazolam as above    Follow-up appointment in 6/21 at 12 PM via Epic [facility].    Revised Medication(s) Post Visit:  Outpatient Encounter Medications as of 06/18/2020   Medication Sig Dispense Refill   ??? acetaminophen (TYLENOL) 325 MG tablet Take 650 mg by mouth daily as needed for pain.     ??? ALPRAZolam (XANAX) 1 MG tablet Take 1 mg by mouth two (2) times a day as needed.      ??? baclofen (LIORESAL) 20 MG tablet Take 20 mg by mouth Three (3) times a day.     ??? butalbital-acetaminophen-caffeine (ESGIC) 50-325-40 mg per tablet Take 1 tablet by mouth every four (4) hours as needed for pain. 120 tablet 3   ??? cholecalciferol, vitamin D3-50 mcg, 2,000 unit,, (VITAMIN D3) 50 mcg (2,000 unit) cap Take 50 mcg by mouth daily.     ??? dexAMETHasone (DECADRON) 2 MG tablet Take 1 tablet (2 mg total) by mouth 2 (two) times a day with meals. 60 tablet 0   ??? dronabinoL (MARINOL) 5 MG capsule Take 1 capsule (5 mg total) by mouth Three (3) times a day with a meal. 90 capsule 0   ??? ibuprofen (ADVIL,MOTRIN) 200 MG tablet Take 600 mg by mouth daily as needed for pain.     ??? levETIRAcetam (KEPPRA) 1000 MG tablet Take 1 tablet (1,000 mg total) by mouth Two (2) times a day. 180 tablet 3   ??? nortriptyline (PAMELOR) 25 MG capsule 1 tab PO at bedtime x 1 week, then increase to 2 tabs PO qhs 60 capsule 5   ??? ondansetron (ZOFRAN-ODT) 8 MG disintegrating tablet Dissolve 1 tablet on the tongue 30 minutes before chemo and every 8 hours as needed for nausea. 60 tablet 10   ??? oxyCODONE (OXY-IR) 5 mg capsule Take 5 mg by mouth every four (4) hours as needed for pain.     ??? propranoloL (INDERAL) 20 MG tablet Take 20 mg by mouth Two (2) times a day.      ??? sildenafiL (VIAGRA) 50 MG tablet Take 1 tablet (50 mg total) by mouth daily as needed for erectile dysfunction. 20 tablet 2   ??? sulfamethoxazole-trimethoprim (BACTRIM DS) 800-160 mg per tablet Take 1 tablet by mouth on Mondays, Wednesdays, and Fridays while on radiation therapy. 20 tablet 0   ??? temozolomide (TEMODAR) 140 mg capsule Take 1 capsule (140mg ) by mouth along with one 20 mg capsule (total dose 160 mg) once daily 30 minutes after zofran 1 hr prior to radiation and at bedtime on weekends for 42 consecutive days starting on day 1 of radiation. 42 capsule 0   ??? temozolomide (TEMODAR) 20 mg capsule Take 1 capsule (20mg ) by mouth along with one 140 mg capsule (total dose 160 mg) once daily 30 minutes after zofran 1 hr prior to radiation and at bedtime on weekends for 42 consecutive days starting on day 1 of radiation. 42 capsule 0   ??? traZODone (DESYREL) 50 MG tablet Take 0.5 - 1 tablets (25 - 50 mg) nightly as needed for sleep. 30 tablet 1     No facility-administered encounter medications on file as of 06/18/2020.       Patient was provided with my contact information. He/she was instructed to call [phone] for emergencies.     Thank you for allowing me to participate in the care of this patient.    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 Theodoro Kalata for evaluation of depression and anxiety.      Patient describes relatively good control of anxiety over the last several weeks, managed through relatively long-time regimen of propranolol and alprazolam.  Says that he utilizes the alprazolam every day after radiation, when he reliably feels like anxiety is worse, and before bed.  Elaborates that he often talks with kids before bed and finds it distressing that he has not seen them in so long (last just prior to his craniotomy).  Says that his anxiety (historically and in the present) is a combination of worries and physical sensation (most namely, chest tightening).  Identifies as having panic attacks.  Says lately these have been a few times per day, but clarifies that this is less frequent than in the past, and that these do not necessary involve full on panic symptoms but rather inflections in anxiety.  Has noticed anxiety provoked by labs/test results as well as conversations in which others contextualize his illness and prognosis as part of religion/God's plan/being in God's hands.  In general, feels very supported by friends and family, and brother in particular.  Finds time with friends helpful in both talking through his emotions and distracting himself from his current situation.  Describes other coping skills as playing video games (which has been harder to do lately w/ blurred vision and impaired coordination) and watching/rewatching TV and movies.     Denies feeling depressed.  Denies SI, elaborating that this suicide would be selfish and not fair to all those supporting him.  Does identify, instead, with feeling angry - why am I going through this bullshit? but feels very motivated to exhaust all potential treatment options.  He adds that even if certain treatments are not helpful to him, he wants to contribute to medical knowledge and help future patients with GBM live longer.  Does state that in the last 1-2 months, sleep has been worse.  Takes 2-3 hours to fall asleep.  Over this period, will usually take 0.5 mg Alprazolam followed 1-2 hr later by another dose of 0.5 mg Alprazolam.  Listens to audiobooks to help self unwind.  Has good quality, undisturbed sleep once he falls asleep, but wakes up naturally by 6 AM (5-6 hr total sleep).  Feels tired during the day.  Has not noticed improvement in sleep with starting nortriptyline and feels like sleep has worsened somewhat since starting dexamethasone.    Regarding other symptoms, admits to [facility] of a cat in weeks leading up to his surgery.  This has resolved, bust still some nondescript patterns in the periphery of his vision that seem to move a bit.  Denies AH.  Denies overt confusion.  Admits to drinking 2 beers at a time a few times per week and uses a vape pen (unclear if for cannabis, nicotine, or both).  Denies other drug use or identifying with overusing any substance in the past.    ---    -worsening headaches --> increased dexamethasone dose  -recently completed disability applicaiton    Allergies: Patient has no known allergies.    Medications:   Current Outpatient Medications   Medication Sig Dispense Refill   ??? acetaminophen (TYLENOL) 325 MG tablet Take 650 mg by mouth daily as needed for pain.     ???  ALPRAZolam (XANAX) 1 MG tablet Take 1 mg by mouth two (2) times a day as needed.      ??? baclofen (LIORESAL) 20 MG tablet Take 20 mg by mouth Three (3) times a day.     ??? butalbital-acetaminophen-caffeine (ESGIC) 50-325-40 mg per tablet Take 1 tablet by mouth every four (4) hours as needed for pain. 120 tablet 3   ??? cholecalciferol, vitamin D3-50 mcg, 2,000 unit,, (VITAMIN D3) 50 mcg (2,000 unit) cap Take 50 mcg by mouth daily.     ??? dexAMETHasone (DECADRON) 2 MG tablet Take 1 tablet (2 mg total) by mouth 2 (two) times a day with meals. 60 tablet 0   ??? dronabinoL (MARINOL) 5 MG capsule Take 1 capsule (5 mg total) by mouth Three (3) times a day with a meal. 90 capsule 0   ??? ibuprofen (ADVIL,MOTRIN) 200 MG tablet Take 600 mg by mouth daily as needed for pain.     ??? levETIRAcetam (KEPPRA) 1000 MG tablet Take 1 tablet (1,000 mg total) by mouth Two (2) times a day. 180 tablet 3   ??? nortriptyline (PAMELOR) 25 MG capsule 1 tab PO at bedtime x 1 week, then increase to 2 tabs PO qhs 60 capsule 5   ??? ondansetron (ZOFRAN-ODT) 8 MG disintegrating tablet Dissolve 1 tablet on the tongue 30 minutes before chemo and every 8 hours as needed for nausea. 60 tablet 10   ??? oxyCODONE (OXY-IR) 5 mg capsule Take 5 mg by mouth every four (4) hours as needed for pain.     ??? propranoloL (INDERAL) 20 MG tablet Take 20 mg by mouth Two (2) times a day.      ??? sildenafiL (VIAGRA) 50 MG tablet Take 1 tablet (50 mg total) by mouth daily as needed for erectile dysfunction. 20 tablet 2   ??? sulfamethoxazole-trimethoprim (BACTRIM DS) 800-160 mg per tablet Take 1 tablet by mouth on Mondays, Wednesdays, and Fridays while on radiation therapy. 20 tablet 0   ??? temozolomide (TEMODAR) 140 mg capsule Take 1 capsule (140mg ) by mouth along with one 20 mg capsule (total dose 160 mg) once daily 30 minutes after zofran 1 hr prior to radiation and at bedtime on weekends for 42 consecutive days starting on day 1 of radiation. 42 capsule 0   ??? temozolomide (TEMODAR) 20 mg capsule Take 1 capsule (20mg ) by mouth along with one 140 mg capsule (total dose 160 mg) once daily 30 minutes after zofran 1 hr prior to radiation and at bedtime on weekends for 42 consecutive days starting on day 1 of radiation. 42 capsule 0   ??? traZODone (DESYREL) 50 MG tablet Take 0.5 - 1 tablets (25 - 50 mg) nightly as needed for sleep. 30 tablet 1     No current facility-administered medications for this visit.       Past Psychiatric History: Reviewed  Past medication trials: Busprione (up to 15 tid, hangover feeling), fluoxetine (headaches), duloxetine or sertraline (no benefit), Clonazepam (cloudy headed, sedated)  Outpatient providers: No previous mental health specialty care     Past Medical History: Reviewed  Of note, previous irregular tachycardia (and underlying ?cardiomyopathy).  Treated previously with pharmacotherapy but told he no longer needed.    Surgical History:  No past surgical history on file.    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  Has 2  kids (ages 2 and 62) who live in [place] area with their mother.  Access to firearm: Brother has a firearm, but patient unaware of where it is located.  Legal: Not asked  Abuse: Not asked    Family History: Reviewed    ROS: All systems reviewed are negative except as described in the HPI.    Objective:     Vitals:   BP Readings from Last 3 Encounters:   03/15/20 140/106   02/17/20 142/89   02/05/20 147/99       Pulse Readings from Last 3 Encounters:   03/15/20 62   02/05/20 81       Mental Status Exam:  Appearance:    Appears stated age   Motor:   No abnormal movements   Speech/Language:    Normal rate, volume, tone, fluency   Mood:   ok   Affect:    Constricted, but mood reactive   Thought process:   Logical, linear, clear, coherent, goal directed   Thought content:     Denies SI, HI, self harm, delusions, obsessions, paranoid ideation, or ideas of reference   Perceptual disturbances:     Denies auditory and visual hallucinations, behavior not concerning for response to internal stimuli     Orientation:   Oriented to person, place, time, and general circumstances   Attention:   Able to fully attend without fluctuations in consciousness   Concentration:   Able to fully concentrate and attend   Memory:   Immediate, short-term, long-term, and recall grossly intact    Fund of knowledge:    Consistent with level of education and development   Insight:     Fair   Judgment:    Fair   Impulse Control:   Fair     PE:   General: in NAD  Pulm: No increased work of breathing  Neuro: Face symmetric.  No tics, tremors, other abnormal movements.    Test Results:  Data Review:   Recent Results (from the past 672 hour(s))   Comprehensive Metabolic Panel    Collection Time: 05/23/20 10:38 AM   Result Value Ref Range    Sodium 141 135 - 145 mmol/L    Potassium 4.2 3.5 - 5.1 mmol/L    Chloride 107 98 - 107 mmol/L    Anion Gap 5 3 - 11 mmol/L    CO2 29.2 20.0 - 31.0 mmol/L    BUN <5 (L) 9 - 23 mg/dL    Creatinine 4.69 6.29 - 1.10 mg/dL    EGFR CKD-EPI Non-African American, Male >90 mL/min/1.46m2    EGFR CKD-EPI African American, Male >90 mL/min/1.74m2    Glucose 83 70 - 179 mg/dL    Calcium 9.7 8.7 - 52.8 mg/dL    Albumin 3.7 3.4 - 5.0 g/dL    Total Protein 6.8 5.7 - 8.2 g/dL    Total Bilirubin 0.7 0.3 - 1.2 mg/dL    AST 20 <41 U/L    ALT 20 10 - 49 U/L    Alkaline Phosphatase 40 (L) 46 - 116 U/L   Vitamin D 25 Hydroxy (25OH D2 + D3)    Collection Time: 05/23/20 10:38 AM   Result Value Ref Range    Vitamin D Total (25OH) 20.1 20.0 - 80.0 ng/mL   CBC w/ Differential    Collection Time: 05/23/20 10:38 AM   Result Value Ref Range    WBC 16.8 (H) 3.5 - 10.5 10*9/L    RBC 4.83 4.32 - 5.72 10*12/L  HGB 16.0 13.5 - 17.5 g/dL    HCT 16.1 09.6 - 04.5 %    MCV 96.6 (H) 81.0 - 95.0 fL    MCH 33.1 26.0 - 34.0 pg    MCHC 34.3 30.0 - 36.0 g/dL    RDW 40.9 81.1 - 91.4 %    MPV 8.3 7.0 - 10.0 fL    Platelet 255 150 - 450 10*9/L    nRBC 0 <=4 /100 WBCs    Neutrophils % 74.9 %    Lymphocytes % 15.5 %    Monocytes % 6.8 %    Eosinophils % 2.3 %    Basophils % 0.5 %    Absolute Neutrophils 12.6 (H) 1.7 - 7.7 10*9/L    Absolute Lymphocytes 2.6 0.7 - 4.0 10*9/L    Absolute Monocytes 1.1 (H) 0.1 - 1.0 10*9/L    Absolute Eosinophils 0.4 0.0 - 0.7 10*9/L    Absolute Basophils 0.1 0.0 - 0.1 10*9/L   Comprehensive Metabolic Panel    Collection Time: 05/30/20 10:31 AM   Result Value Ref Range    Sodium 139 135 - 145 mmol/L    Potassium 4.7 3.5 - 5.1 mmol/L    Chloride 106 98 - 107 mmol/L    Anion Gap <3 (L) 3 - 11 mmol/L    CO2 33.3 (H) 20.0 - 31.0 mmol/L    BUN 5 (L) 9 - 23 mg/dL    Creatinine 7.82 9.56 - 1.10 mg/dL    BUN/Creatinine Ratio 7     EGFR CKD-EPI Non-African American, Male >90 mL/min/1.54m2    EGFR CKD-EPI African American, Male >90 mL/min/1.49m2    Glucose 79 70 - 179 mg/dL    Calcium 9.7 8.7 - 21.3 mg/dL    Albumin 3.8 3.4 - 5.0 g/dL    Total Protein 7.0 5.7 - 8.2 g/dL    Total Bilirubin 0.8 0.3 - 1.2 mg/dL    AST 14 <08 U/L    ALT 26 10 - 49 U/L    Alkaline Phosphatase 44 (L) 46 - 116 U/L   Vitamin D 25 Hydroxy (25OH D2 + D3)    Collection Time: 05/30/20 10:31 AM   Result Value Ref Range    Vitamin D Total (25OH) 21.5 20.0 - 80.0 ng/mL   CBC w/ Differential    Collection Time: 05/30/20 10:31 AM   Result Value Ref Range    WBC 17.7 (H) 3.5 - 10.5 10*9/L    RBC 4.88 4.32 - 5.72 10*12/L    HGB 16.2 13.5 - 17.5 g/dL    HCT 65.7 84.6 - 96.2 %    MCV 97.9 (H) 81.0 - 95.0 fL    MCH 33.1 26.0 - 34.0 pg    MCHC 33.8 30.0 - 36.0 g/dL    RDW 95.2 84.1 - 32.4 %    MPV 8.0 7.0 - 10.0 fL    Platelet 358 150 - 450 10*9/L nRBC 0 <=4 /100 WBCs    Neutrophils % 84.8 %    Lymphocytes % 6.2 %    Monocytes % 8.3 %    Eosinophils % 0.4 %    Basophils % 0.3 %    Absolute Neutrophils 15.0 (H) 1.7 - 7.7 10*9/L    Absolute Lymphocytes 1.1 0.7 - 4.0 10*9/L    Absolute Monocytes 1.5 (H) 0.1 - 1.0 10*9/L    Absolute Eosinophils 0.1 0.0 - 0.7 10*9/L    Absolute Basophils 0.1 0.0 - 0.1 10*9/L   Comprehensive Metabolic Panel    Collection  Time: 06/06/20  9:54 AM   Result Value Ref Range    Sodium 141 135 - 145 mmol/L    Potassium 4.3 3.5 - 5.1 mmol/L    Chloride 108 (H) 98 - 107 mmol/L    Anion Gap <3 (L) 3 - 11 mmol/L    CO2 32.8 (H) 20.0 - 31.0 mmol/L    BUN 7 (L) 9 - 23 mg/dL    Creatinine 5.40 9.81 - 1.10 mg/dL    BUN/Creatinine Ratio 8     EGFR CKD-EPI Non-African American, Male >90 mL/min/1.53m2    EGFR CKD-EPI African American, Male >90 mL/min/1.61m2    Glucose 86 70 - 179 mg/dL    Calcium 9.1 8.7 - 19.1 mg/dL    Albumin 3.3 (L) 3.4 - 5.0 g/dL    Total Protein 6.2 5.7 - 8.2 g/dL    Total Bilirubin 1.1 0.3 - 1.2 mg/dL    AST 17 <47 U/L    ALT 26 10 - 49 U/L    Alkaline Phosphatase 35 (L) 46 - 116 U/L   Vitamin D 25 Hydroxy (25OH D2 + D3)    Collection Time: 06/06/20  9:54 AM   Result Value Ref Range    Vitamin D Total (25OH) 20.5 20.0 - 80.0 ng/mL   CBC w/ Differential    Collection Time: 06/06/20  9:54 AM   Result Value Ref Range    WBC 19.0 (H) 3.5 - 10.5 10*9/L    RBC 4.79 4.32 - 5.72 10*12/L    HGB 15.7 13.5 - 17.5 g/dL    HCT 82.9 56.2 - 13.0 %    MCV 97.7 (H) 81.0 - 95.0 fL    MCH 32.8 26.0 - 34.0 pg    MCHC 33.6 30.0 - 36.0 g/dL    RDW 86.5 78.4 - 69.6 %    MPV 7.7 7.0 - 10.0 fL    Platelet 282 150 - 450 10*9/L    nRBC 0 <=4 /100 WBCs    Neutrophils % 78.0 %    Lymphocytes % 12.9 %    Monocytes % 6.5 %    Eosinophils % 2.1 %    Basophils % 0.5 %    Absolute Neutrophils 14.8 (H) 1.7 - 7.7 10*9/L    Absolute Lymphocytes 2.5 0.7 - 4.0 10*9/L    Absolute Monocytes 1.2 (H) 0.1 - 1.0 10*9/L    Absolute Eosinophils 0.4 0.0 - 0.7 10*9/L Absolute Basophils 0.1 0.0 - 0.1 10*9/L   Comprehensive Metabolic Panel    Collection Time: 06/12/20 10:20 AM   Result Value Ref Range    Sodium 138 135 - 145 mmol/L    Potassium 4.3 3.5 - 5.1 mmol/L    Chloride 107 98 - 107 mmol/L    Anion Gap 3 3 - 11 mmol/L    CO2 28.3 20.0 - 31.0 mmol/L    BUN <5 (L) 9 - 23 mg/dL    Creatinine 2.95 2.84 - 1.10 mg/dL    EGFR CKD-EPI Non-African American, Male >90 mL/min/1.74m2    EGFR CKD-EPI African American, Male >90 mL/min/1.55m2    Glucose 98 70 - 179 mg/dL    Calcium 9.3 8.7 - 13.2 mg/dL    Albumin 3.5 3.4 - 5.0 g/dL    Total Protein 7.1 5.7 - 8.2 g/dL    Total Bilirubin 0.9 0.3 - 1.2 mg/dL    AST 22 <44 U/L    ALT 26 10 - 49 U/L    Alkaline Phosphatase 39 (L) 46 - 116 U/L  Vitamin D 25 Hydroxy (25OH D2 + D3)    Collection Time: 06/12/20 10:20 AM   Result Value Ref Range    Vitamin D Total (25OH) 28.0 20.0 - 80.0 ng/mL   CBC w/ Differential    Collection Time: 06/12/20 10:20 AM   Result Value Ref Range    WBC 12.5 (H) 3.5 - 10.5 10*9/L    RBC 5.06 4.32 - 5.72 10*12/L    HGB 17.0 13.5 - 17.5 g/dL    HCT 16.1 09.6 - 04.5 %    MCV 97.5 (H) 81.0 - 95.0 fL    MCH 33.6 26.0 - 34.0 pg    MCHC 34.5 30.0 - 36.0 g/dL    RDW 40.9 81.1 - 91.4 %    MPV 8.1 7.0 - 10.0 fL    Platelet 247 150 - 450 10*9/L    nRBC 0 <=4 /100 WBCs    Neutrophils % 80.6 %    Lymphocytes % 8.4 %    Monocytes % 7.4 %    Eosinophils % 3.3 %    Basophils % 0.3 %    Absolute Neutrophils 10.1 (H) 1.7 - 7.7 10*9/L    Absolute Lymphocytes 1.1 0.7 - 4.0 10*9/L    Absolute Monocytes 0.9 0.1 - 1.0 10*9/L    Absolute Eosinophils 0.4 0.0 - 0.7 10*9/L    Absolute Basophils 0.0 0.0 - 0.1 10*9/L     Imaging: Radiology report(s) reviewed.      Horton Marshall, MD  06/17/2020

## 2020-06-18 NOTE — Unmapped (Signed)
LV for patient with call back number and will send mychart message.

## 2020-06-19 ENCOUNTER — Encounter
Admit: 2020-06-19 | Discharge: 2020-06-20 | Payer: PRIVATE HEALTH INSURANCE | Attending: Student in an Organized Health Care Education/Training Program | Primary: Student in an Organized Health Care Education/Training Program

## 2020-06-19 ENCOUNTER — Encounter
Admit: 2020-06-19 | Discharge: 2020-06-20 | Disposition: A | Discharge status: Home / Self Care | Payer: PRIVATE HEALTH INSURANCE | Attending: Emergency Medicine

## 2020-06-19 ENCOUNTER — Ambulatory Visit: Admit: 2020-06-19 | Discharge: 2020-06-20 | Payer: PRIVATE HEALTH INSURANCE

## 2020-06-19 DIAGNOSIS — C719 Malignant neoplasm of brain, unspecified: Principal | ICD-10-CM

## 2020-06-19 LAB — GLUCOSE RANDOM: Glucose:MCnc:Pt:Ser/Plas:Qn:: 143

## 2020-06-19 LAB — CBC W/ AUTO DIFF
BASOPHILS ABSOLUTE COUNT: 0 10*9/L (ref 0.0–0.1)
BASOPHILS RELATIVE PERCENT: 0.2 %
EOSINOPHILS ABSOLUTE COUNT: 0 10*9/L (ref 0.0–0.4)
EOSINOPHILS RELATIVE PERCENT: 0.3 %
HEMATOCRIT: 46 % (ref 41.0–53.0)
HEMOGLOBIN: 15.8 g/dL (ref 13.5–17.5)
LARGE UNSTAINED CELLS: 0 % (ref 0–4)
LYMPHOCYTES RELATIVE PERCENT: 3.8 %
MEAN CORPUSCULAR HEMOGLOBIN CONC: 34.4 g/dL (ref 31.0–37.0)
MEAN CORPUSCULAR HEMOGLOBIN: 34.4 pg — ABNORMAL HIGH (ref 26.0–34.0)
MEAN CORPUSCULAR VOLUME: 99.8 fL (ref 80.0–100.0)
MEAN PLATELET VOLUME: 8.5 fL (ref 7.0–10.0)
MONOCYTES ABSOLUTE COUNT: 0.3 10*9/L (ref 0.2–0.8)
MONOCYTES RELATIVE PERCENT: 2.1 %
NEUTROPHILS ABSOLUTE COUNT: 12.1 10*9/L — ABNORMAL HIGH (ref 2.0–7.5)
NEUTROPHILS RELATIVE PERCENT: 93.1 %
PLATELET COUNT: 431 10*9/L (ref 150–440)
RED BLOOD CELL COUNT: 4.61 10*12/L (ref 4.50–5.90)
WBC ADJUSTED: 13 10*9/L — ABNORMAL HIGH (ref 4.5–11.0)

## 2020-06-19 LAB — COMPREHENSIVE METABOLIC PANEL
ALBUMIN: 3.9 g/dL (ref 3.5–5.0)
ALT (SGPT): 32 U/L (ref <50)
ANION GAP: 9 mmol/L (ref 7–15)
AST (SGOT): 20 U/L (ref 19–55)
BILIRUBIN TOTAL: 0.3 mg/dL (ref 0.0–1.2)
BLOOD UREA NITROGEN: 6 mg/dL — ABNORMAL LOW (ref 7–21)
BUN / CREAT RATIO: 10
CALCIUM: 9.5 mg/dL (ref 8.5–10.2)
CHLORIDE: 106 mmol/L (ref 98–107)
CO2: 24 mmol/L (ref 22.0–30.0)
CREATININE: 0.63 mg/dL — ABNORMAL LOW (ref 0.70–1.30)
EGFR CKD-EPI NON-AA MALE: >90 mL/min/{1.73_m2} (ref >=60)
GLUCOSE RANDOM: 143 mg/dL (ref 70–179)
POTASSIUM: 4.4 mmol/L (ref 3.5–5.0)
PROTEIN TOTAL: 6.8 g/dL (ref 6.5–8.3)

## 2020-06-19 LAB — LYMPHOCYTES RELATIVE PERCENT: Lymphocytes/100 leukocytes:NFr:Pt:Bld:Qn:Automated count: 3.8

## 2020-06-19 LAB — PROTIME-INR: INR: 0.94

## 2020-06-19 LAB — HEPARIN CORRELATION: Lab: <0.2

## 2020-06-19 LAB — PROTIME: Coagulation tissue factor induced:Time:Pt:PPP:Qn:Coag: 11.2

## 2020-06-19 MED ORDER — ONDANSETRON 4 MG DISINTEGRATING TABLET
ORAL_TABLET | Freq: Three times a day (TID) | ORAL | 0 refills | 3.00000 days | Status: CP | PRN
Start: 2020-06-19 — End: 2020-06-19

## 2020-06-19 MED ORDER — OXYCODONE 15 MG TABLET: 15 mg | tablet | Freq: Four times a day (QID) | 0 refills | 4 days | Status: AC

## 2020-06-19 MED ORDER — OXYCODONE 15 MG TABLET
ORAL_TABLET | Freq: Four times a day (QID) | ORAL | 0 refills | 4.00000 days | Status: CP | PRN
Start: 2020-06-19 — End: 2020-06-19

## 2020-06-19 MED ORDER — ONDANSETRON 4 MG DISINTEGRATING TABLET: 8 mg | tablet | Freq: Three times a day (TID) | 0 refills | 3 days | Status: AC

## 2020-06-19 MED ADMIN — oxyCODONE (ROXICODONE) immediate release tablet 10 mg: 10 mg | ORAL | @ 21:00:00 | Stop: 2020-06-19

## 2020-06-19 MED ADMIN — lactated ringers bolus 1,000 mL: 1000 mL | INTRAVENOUS | @ 21:00:00 | Stop: 2020-06-19

## 2020-06-19 MED ADMIN — ketorolac (TORADOL) injection 15 mg: 15 mg | INTRAVENOUS | @ 22:00:00 | Stop: 2020-06-19

## 2020-06-19 MED ADMIN — MORPhine injection 8 mg: 8 mg | INTRAVENOUS | @ 22:00:00 | Stop: 2020-06-19

## 2020-06-19 MED ADMIN — oxyCODONE (ROXICODONE) immediate release tablet 5 mg: 5 mg | ORAL | @ 20:00:00 | Stop: 2020-06-19

## 2020-06-19 MED ADMIN — gadobenate dimeglumine (MULTIHANCE) 529 mg/mL (0.1mmol/0.2mL) solution 17 mL: 17 mL | INTRAVENOUS | @ 17:00:00 | Stop: 2020-06-19

## 2020-06-19 NOTE — Unmapped (Signed)
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

## 2020-06-19 NOTE — Unmapped (Signed)
[facility] [employer] [facility]  [facility] Neuro-Oncology Program    Return Patient Consultation    Demographics:  Patient Name: Darrell [name]  MRN: [id number]  DOB: 1986-10-12  Age: 34 y.o.    Identifying Statement:  Darrell [name] is a 34 y.o. 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    Assessment and Recommendations:  Darrell [name] is a pleasant 34 y.o. 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 The patient initially came to medical attention presenting to the [facility] campus ED on 03/15/20 with a 3 week history of intractable headaches with associated photophobia, blurry and double vision, and nausea/vomiting. CT Head in the ED revealed a new large ill-defined 6.8 cm hemorrhagic masslike lesion in the right parietotemporal region with prominent surrounding vasogenic edema and mass effect with 1.0 cm leftward midline shift. He was offered direct admission to the [facility] Neuro ICU at that time however patient decided to go home to be with family before undergoing further workup. He subsequently went to [facility] the following day 03/16/20 where he recieved IV dexamethasone and had brain-lab guided MRI that confirmed medial right temporal lesion measuring 5.6 x 4.8 x 3.9 cmHGG with satellite lesions as well as a trapped R temporal horn. He is s/p brain-lab guided right temporal craniotomy for gross total resection of the tumor on 03/21/20 by Dr. Adriana Simas at [facility]. 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 Patient completed 6000cGy RT with concurrent temozolomide locally with Dr. Carles Collet from 04/30/20 - 06/12/20.    KPS today is 90. Most recent MRI on 06/19/20 was compared to OSH MRI dated 03/21/20 and demonstrates the development of a large fluid containing cystic structure with another adjacent smaller cyst; there is an area of enhancing disease along the periphery of the larger cyst. Labs from 06/12/20 were reviewed and were largely reassuring. Physical exam was largely reassuring. Neurological exam showed no obvious deficits, however patient reports persistent daily headaches similar to headaches before initial diagnosis. I suspect the cause if the enlarging cystic lesion.    We reviewed his mort recent MRI showing a large cyst that needs to be addressed surgically. This is likely the source of his headaches. I recommend urgent ED evaluation and neurosurgerical consultation.  Plan will be developed following neurosurgical intervention. I will discuss the case with the team.     Remainder of plan is outlined below.    Recommendations and Plan:  Right Frontotemporal Glioblastoma (WHO Grade IV)  - s/p gross total resection on 03/21/20 by Dr. Adriana Simas at [facility]  - MRI from 06/19/20 w/ large cyst - referred to the ED  - s/p 6000 cGy with Dr. Flonnie Hailstone with concurrent TMZ  - will send TEMPUS sequencing - reviewed    Tumor-Related Headaches  - daily dull headaches with associated nausea, and dizziness.  - con't Tylenol PRN as needed  - start nortriptyline qHs- Rx sent to local [facility] in [place]    Supportive Management:  - ondansetron 8 mg PO 30 minutes prior to chemotherapy and 8 hours as needed for nausea/vomiting  - monitoring for cognitive decline  - monitor for seizures - none thus far  - he will need to have charity care and PAP assistance set up    Disposition:   - RTC based on neurosurgical assessment - likely a couple of week postop    I have reviewed the  laboratory, pathology, and radiology reports in detail and discussed findings with the patient and family members. The patient and family verbalized understanding of the discussion and plan; all posed questions were answered to their apparent satisfaction. The patient and family were advised to contact us with any questions or concerns that arise prior to their next appointment.    Maxine Glenn, MD  Director, [facility] Neuro-Oncology Program  Assistant Professor, [facility] [employer] [facility]  Division of Hematology and Medical Oncology    Oncology History Overview Note   Identifying Statement:  Darrell [name] is a 34 y.o. 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 Initially came to medical attention presenting to [facility] ED with a 3 week history of intractable headaches with associated photophobia, blurry vision, and nausea/vomiting.     Molecular Markers:  Ki67: pending  MGMT promotor: unmethylated  IDH: wildtype  TERT promotor: mutated    Treatment History:  03/15/20: presented to [facility] ED w/ a 3 week history of intractable HAs. CT Head revealed new large ill-defined 6.8 cm hemorrhagic masslike lesion in the right parietotemporal region with prominent surrounding vasogenic edema and mass effect with 1.0 cm leftward midline shift.   03/16/20: brain-lab guided MRI a [facility] confirmed medial right temporal lesion measuring 5.6 x 4.8 x 3.9 cmHGG with satellite lesions as well as a trapped R temporal horn.   03/21/20: s/p brain-lab guided right temporal craniotomy for resection by Dr. Adriana Simas at [facility]. 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  04/17/20: initial eval w/ Kanya Potteiger for GBM; KPS 90; MRI w/ GTR; proceed w/ chemoRT; RTC 2-3 weeks post completion of chemoRT w/ MRI  04/30/20-06/12/20: concurrent chemoRT; 6000 cGy total  06/19/20: KPS 90; MRI w/ large cyst; referred to ED for surgical intervention         Interval History:  Darrell [name] is a 34 y.o. 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 Darrell [name] has a history of HTN, anxiety, and tobacco use. Patient was last seen in clinic on 04/17/20 since which time patient established care with Dr. Flonnie Hailstone and planned for IMRT. He completed 6000 cGy RT with concurrent temozolomide. He presented to the [facility] ED on 05/01/20 with new seizures. Patient was evaluated by Dr. Darel Hong with Neuro Ophthalmology on 06/14/20.     Patient presents to the clinic complaining of persistent headaches throughout radiation and continuing today. His headaches have worsened over the past week. He has used tylenol and oxycodone to no relief.  He also reports some blurry vision and nausea with emesis.     Review of Systems:  As noted within the HPI; otherwise, negative on 12 system review.    Allergies:  No Known Allergies    Medications:  No current facility-administered medications for this visit.     Current Outpatient Medications   Medication Sig Dispense Refill   ??? acetaminophen (TYLENOL) 325 MG tablet Take 650 mg by mouth daily as needed for pain.     ??? ALPRAZolam (XANAX) 1 MG tablet Take 1 mg by mouth two (2) times a day as needed.      ??? baclofen (LIORESAL) 20 MG tablet Take 20 mg by mouth Three (3) times a day.     ??? butalbital-acetaminophen-caffeine (ESGIC) 50-325-40 mg per tablet Take 1 tablet by mouth every four (4) hours as needed for pain. 120 tablet 3   ??? cholecalciferol, vitamin  D3-50 mcg, 2,000 unit,, (VITAMIN D3) 50 mcg (2,000 unit) cap Take 50 mcg by mouth daily.     ??? dexAMETHasone (DECADRON) 2 MG tablet Take 1 tablet (2 mg total) by mouth 2 (two) times a day with meals. 60 tablet 0   ??? dronabinoL (MARINOL) 5 MG capsule Take 1 capsule (5 mg total) by mouth Three (3) times a day with a meal. 90 capsule 0   ??? ibuprofen (ADVIL,MOTRIN) 200 MG tablet Take 600 mg by mouth daily as needed for pain.     ??? levETIRAcetam (KEPPRA) 1000 MG tablet Take 1 tablet (1,000 mg total) by mouth Two (2) times a day. 180 tablet 3   ??? nortriptyline (PAMELOR) 25 MG capsule 1 tab PO at bedtime x 1 week, then increase to 2 tabs PO qhs 60 capsule 5   ??? ondansetron (ZOFRAN-ODT) 8 MG disintegrating tablet Dissolve 1 tablet on the tongue 30 minutes before chemo and every 8 hours as needed for nausea. 60 tablet 10   ??? oxyCODONE (OXY-IR) 5 mg capsule Take 5 mg by mouth every four (4) hours as needed for pain.     ??? propranoloL (INDERAL) 20 MG tablet Take 20 mg by mouth Two (2) times a day.      ??? sildenafiL (VIAGRA) 50 MG tablet Take 1 tablet (50 mg total) by mouth daily as needed for erectile dysfunction. 20 tablet 2   ??? sulfamethoxazole-trimethoprim (BACTRIM DS) 800-160 mg per tablet Take 1 tablet by mouth on Mondays, Wednesdays, and Fridays while on radiation therapy. 20 tablet 0   ??? temozolomide (TEMODAR) 140 mg capsule Take 1 capsule (140mg ) by mouth along with one 20 mg capsule (total dose 160 mg) once daily 30 minutes after zofran 1 hr prior to radiation and at bedtime on weekends for 42 consecutive days starting on day 1 of radiation. 42 capsule 0   ??? temozolomide (TEMODAR) 20 mg capsule Take 1 capsule (20mg ) by mouth along with one 140 mg capsule (total dose 160 mg) once daily 30 minutes after zofran 1 hr prior to radiation and at bedtime on weekends for 42 consecutive days starting on day 1 of radiation. 42 capsule 0   ??? traZODone (DESYREL) 50 MG tablet Take 0.5 - 1 tablets (25 - 50 mg) nightly as needed for sleep. 30 tablet 1     Facility-Administered Medications Ordered in Other Visits   Medication Dose Route Frequency Provider Last Rate Last Admin   ??? oxyCODONE (ROXICODONE) immediate release tablet 5 mg  5 mg Oral Once Katherina Right, MD         Xanax 1 mg qHs for anxiety  propranalol for blood pressure  baclofen for irregular tachycardia / cardiomyopathy (managed by PCP, Dr. Barnett Hatter)  Not taking Prozac    Past Medical History:  Past Medical History:   Diagnosis Date   ??? Migraine        Past Surgical History:  No past surgical history on file.    Social History:  Social History     Socioeconomic History   ??? Marital status: Single     Spouse name: Not on file   ??? Number of children: Not on file   ??? Years of education: Not on file   ??? Highest education level: Not on file   Occupational History   ??? Not on file   Tobacco Use   ??? Smoking status: Current Every Day Smoker     Packs/day: 0.40     Years: 10.00  Pack years: 4.00     Types: Cigarettes   ??? Smokeless tobacco: Never Used   Substance and Sexual Activity   ??? Alcohol use: Yes   ??? Drug use: Never   ??? Sexual activity: Not on file   Other Topics Concern   ??? Not on file   Social History Narrative   ??? Not on file     Social Determinants of Health     Financial Resource Strain:    ??? Difficulty of Paying Living Expenses:    Food Insecurity:    ??? Worried About Programme researcher, broadcasting/film/video in the Last Year:    ??? Barista in the Last Year:    Transportation Needs:    ??? Freight forwarder (Medical):    ??? Lack of Transportation (Non-Medical):    Physical Activity:    ??? Days of Exercise per Week:    ??? Minutes of Exercise per Session:    Stress:    ??? Feeling of Stress :    Social Connections:    ??? Frequency of Communication with Friends and Family:    ??? Frequency of Social Gatherings with Friends and Family:    ??? Attends Religious Services:    ??? Database administrator or Organizations:    ??? Attends Engineer, structural:    ??? 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     Family History:  Family History   Problem Relation Age of Onset   ??? No Known Problems Mother    ??? No Known Problems Father    ??? No Known Problems Sister    ??? No Known Problems Brother    ??? No Known Problems Maternal Aunt    ??? No Known Problems Maternal Uncle    ??? No Known Problems Paternal Aunt    ??? No Known Problems Paternal Uncle    ??? No Known Problems Maternal Grandmother    ??? No Known Problems Maternal Grandfather    ??? No Known Problems Paternal Grandmother    ??? No Known Problems Paternal Grandfather    ??? No Known Problems Other    ??? Amblyopia Neg Hx    ??? Blindness Neg Hx    ??? Cancer Neg Hx    ??? Cataracts Neg Hx    ??? Diabetes Neg Hx    ??? Glaucoma Neg Hx    ??? Hypertension Neg Hx    ??? Macular degeneration Neg Hx    ??? Retinal detachment Neg Hx    ??? Strabismus Neg Hx    ??? Stroke Neg Hx    ??? Thyroid disease Neg Hx         History of cancer in both maternal and paternal grandmothers (lung cancer and intra-abdominal cancer)  No family history of brain tumors.      Physical Examination:  Vitals:   Vitals:    06/19/20 1505   BP: 137/93   Pulse: 100   Resp: 16   Temp: 36.9 ??C (98.5 ??F)   SpO2: 96%       General: Alert, calm, cooperative in no acute distress.  Head: Normocephalic, atraumatic. Well-approximated craniotomy scar without erythema, edema, dehiscence, or discharge.  EENT: No conjunctival injection or scleral icterus. Oral mucosa moist without lesions.  Lymphatic: no cervical or supraclavicular LAD  Lungs: Clear to auscultation bilaterally.  Cardiac: Regular rate and rhythm without murmurs, rubs, or gallops.  Abdomen: Active bowel sounds noted. Soft, nontender abdomen.  Skin: Texture, turgor, and pigmentation appear normal. No rashes, cyanosis, or petechiae.  Extremities: No clubbing, cyanosis, edema, or varicosities noted.    Neurological Examination:  Mental Status: Patient alert and oriented x 4 with affect appropriate to the situation.   Speech: Fluent and spontaneous speech. No expressive aphasia. No difficulty with repetition No verbal perseveration No paraphasic errors. No dysarthria. No receptive aphasia.    Cranial Nerves  Vision (II): Visual acuity is grossly without impairment. Visual fields are full.    EOMs (III,IV,VI): Gaze and tracking appear intact with smooth pursuits, no saccades. No ptosis.    Pupils (III): Pupils are equal, round, and reactive to light and accommodation.    Face (V, VII): Facial sensation is intact. Muscles of mastication full and symmetric.  Hearing (VIII): Hearing is grossly without impairment and intact to conversational speech.  Gag/Swallow (IX): Gag reflex testing deferred. Voice without hoarseness.    Palate (X): Elevates symmetrically.    Neck Strength (XI): Sternocleidomastoid and trapezius strength full and symmetric.  Tongue (XII): Midline without fasciculations.    Motor  Motor exam:  normal muscle mass and tone in all extremities, and no pronator drift. Grossly 5/5.    Reflexes- not tested today    Sensory: Intact to light touch.  Coordination: grossly intact  Gait: Steady and normal gait;      Laboratory:  Lab Results   Component Value Date    WBC 12.5 (H) 06/12/2020    HGB 17.0 06/12/2020    HCT 49.3 06/12/2020    PLT 247 06/12/2020       Lab Results   Component Value Date    NA 138 06/12/2020    K 4.3 06/12/2020    CL 107 06/12/2020    CO2 28.3 06/12/2020    BUN <5 (L) 06/12/2020    CREATININE 0.82 06/12/2020    GLU 98 06/12/2020    CALCIUM 9.3 06/12/2020       Lab Results   Component Value Date    BILITOT 0.9 06/12/2020    BILIDIR 0.40 03/15/2020    PROT 7.1 06/12/2020    ALBUMIN 3.5 06/12/2020    ALT 26 06/12/2020    AST 22 06/12/2020    ALKPHOS 39 (L) 06/12/2020       No results found for: LABPROT, INR, APTT    Imaging:  MRI scans were personally reviewed.    Pathology:   Final Diagnosis   (Outside case: [phone], 12 slides, original collection date 03/21/2020)  ??  A: Brain, right frontotemporal lesion, resection  - Glioblastoma (WHO grade IV)  - See comment    We agree with original pathologist diagnosis.  Microscopic examination shows a densely cellular infiltrating diffuse glioma with mitotic figures, areas of tumor necrosis, and microvascular proliferation. Multiple calcospherites are present in the glioma. The glioma focally infiltrates overlying leptomeninges.  Immunohistochemical stains were performed at the originating institution and are reviewed at [facility].  Immunohistochemical stain for IDH1 R132H is negative in tumor cells.  Immunohistochemical stain for ATRX  shows diffusely retained expression within the tumor cells.  ??  IDH1/2 and TERT promoter mutation studies are in process at [facility], and [facility] will issue results as an addendum to their Surgical Pathology report.  We have requested unstained sections for MGMT status and for Ki-67 IHC.     Scribe's Attestation:   Documentation assistance was provided by me personally, Levy Pupa, a scribe. Maxine Glenn, MD obtained and performed the history, physical exam and medical decision making elements that were entered into the chart.   Signed by Levy Pupa, Scribe, on 06/19/20 at 4:13 PM.  ----------------------------------------------------------------------------------------------------------------------  June 19, 2020 4:21 PM. Documentation assistance provided by the Scribe. I was present during the time the encounter was recorded. The information recorded by the Scribe was done at my direction and has been reviewed and validated by me.  ----------------------------------------------------------------------------------------------------------------------      Maxine Glenn, MD  Director, [facility] Neuro-Oncology Program  Assistant Professor, [employer]  Division of Hematology and Medical Oncology

## 2020-06-19 NOTE — Unmapped (Incomplete)
Emergency Department Provider Note        ED Clinical Impression     Final diagnoses:   None       ED Assessment/Plan       History     Chief Complaint   Patient presents with   ??? Headache Re-evaluation       Darrell [name] is a 34 y.o. 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 Theodoro Kalata for neurosurgical evaluation in the setting of worsening headaches, memory deficits, worsening vision, and vomiting for the past week.  Reports daily nausea with several episodes of vomiting, primarily in the morning.  Notes that he also has more trouble with his gait in the morning, and feels that he has trouble maintaining his balance, particularly in the morning.  This tends to improve by the early afternoon, that the headache itself can come on later in the day as well.  Headache is primarily in the right frontal area, and is constantly present, but becomes severe at times to the point of 10/10 pain.  No response to Fioricet, Tylenol, or ibuprofen.  States that he has no fixed neurological deficits, other than the persistent loss of vision, for which she is seeing neuro-ophthalmology, and resting tremor, which she states is actually improved since finishing chemotherapy.  Denies any bowel or bladder dysfunction, recent seizures (is on Keppra due to seizures early in his course), fevers, change in concentration or mentation, difficulty speaking, dysphagia, or weakness.  States that his prednisone dose was recently increased from 2 mg twice daily to 4 mg twice daily, and he has been feeling some tachycardia since this increase.  This happened when he was on higher dose steroids immediately postoperatively as well.  Denies any infectious symptoms.  States he has had decreased food intake, but has been maintaining hydration well.    Past Medical History:   Diagnosis Date   ??? Migraine        No past surgical history on file.    Family History   Problem Relation Age of Onset   ??? No Known Problems Mother    ??? No Known Problems Father    ??? No Known Problems Sister    ??? No Known Problems Brother    ??? No Known Problems Maternal Aunt    ??? No Known Problems Maternal Uncle    ??? No Known Problems Paternal Aunt    ??? No Known Problems Paternal Uncle    ??? No Known Problems Maternal Grandmother    ??? No Known Problems Maternal Grandfather    ??? No Known Problems Paternal Grandmother    ??? No Known Problems Paternal Grandfather    ??? No Known Problems Other    ??? Amblyopia Neg Hx    ??? Blindness Neg Hx    ??? Cancer Neg Hx    ??? Cataracts Neg Hx    ??? Diabetes Neg Hx    ??? Glaucoma Neg Hx    ??? Hypertension Neg Hx    ??? Macular degeneration Neg Hx    ??? Retinal detachment Neg Hx    ??? Strabismus Neg Hx    ??? Stroke Neg Hx    ??? Thyroid disease Neg Hx        Social History     Socioeconomic History   ??? Marital status: Single  Spouse name: Not on file   ??? Number of children: Not on file   ??? Years of education: Not on file   ??? Highest education level: Not on file   Occupational History   ??? Not on file   Tobacco Use   ??? Smoking status: Current Every Day Smoker     Packs/day: 0.40     Years: 10.00     Pack years: 4.00     Types: Cigarettes   ??? Smokeless tobacco: Never Used   Substance and Sexual Activity   ??? Alcohol use: Yes   ??? Drug use: Never   ??? Sexual activity: Not on file   Other Topics Concern   ??? Not on file   Social History Narrative   ??? Not on file     Social Determinants of Health     Financial Resource Strain:    ??? Difficulty of Paying Living Expenses:    Food Insecurity:    ??? Worried About Programme researcher, broadcasting/film/video in the Last Year:    ??? Barista in the Last Year:    Transportation Needs:    ??? Freight forwarder (Medical):    ??? Lack of Transportation (Non-Medical):    Physical Activity:    ??? Days of Exercise per Week:    ??? Minutes of Exercise per Session:    Stress: ??? Feeling of Stress :    Social Connections:    ??? Frequency of Communication with Friends and Family:    ??? Frequency of Social Gatherings with Friends and Family:    ??? Attends Religious Services:    ??? Database administrator or Organizations:    ??? Attends Engineer, structural:    ??? Marital Status:        Review of Systems   Constitutional: Positive for appetite change. Negative for chills and fever.   HENT: Positive for sinus pain. Negative for congestion and trouble swallowing.    Eyes: Positive for visual disturbance.   Respiratory: Negative for cough and shortness of breath.    Cardiovascular: Positive for palpitations. Negative for chest pain and leg swelling.   Gastrointestinal: Negative for abdominal pain, blood in stool and diarrhea.   Genitourinary: Negative for difficulty urinating and dysuria.   Musculoskeletal: Positive for gait problem.   Skin: Negative for rash.   Neurological: Positive for tremors and headaches. Negative for seizures, syncope, speech difficulty and weakness.   Psychiatric/Behavioral: Positive for sleep disturbance. Negative for confusion.       Physical Exam     BP 151/100  - Pulse 120  - Temp 36.7 ??C (98.1 ??F)  - Resp 18  - SpO2 100%     Physical Exam  Vitals reviewed.   Constitutional:       General: He is not in acute distress.     Appearance: Normal appearance. He is not toxic-appearing.      Comments: Occasionally grimacing in pain   HENT:      Head: Normocephalic and atraumatic.      Right Ear: Tympanic membrane normal.      Left Ear: Tympanic membrane normal.      Nose: Nose normal.      Mouth/Throat:      Mouth: Mucous membranes are moist.      Pharynx: Oropharynx is clear.   Eyes:      General: Visual field deficit present.      Extraocular Movements: Extraocular movements intact.  Conjunctiva/sclera: Conjunctivae normal.      Pupils: Pupils are equal, round, and reactive to light.   Cardiovascular:      Rate and Rhythm: Normal rate and regular rhythm. Pulses: Normal pulses.      Heart sounds: Normal heart sounds.   Pulmonary:      Effort: Pulmonary effort is normal. No respiratory distress.      Breath sounds: Normal breath sounds. No wheezing, rhonchi or rales.   Abdominal:      General: Abdomen is flat. Bowel sounds are normal.      Palpations: Abdomen is soft. There is no mass.      Tenderness: There is no abdominal tenderness. There is no right CVA tenderness, left CVA tenderness or guarding.   Musculoskeletal:         General: No swelling or deformity. Normal range of motion.      Cervical back: Normal range of motion and neck supple. No tenderness.      Right lower leg: No edema.      Left lower leg: No edema.   Skin:     General: Skin is warm and dry.      Capillary Refill: Capillary refill takes less than 2 seconds.      Findings: No lesion or rash.   Neurological:      General: No focal deficit present.      Mental Status: He is alert and oriented to person, place, and time.      GCS: GCS eye subscore is 4. GCS verbal subscore is 5. GCS motor subscore is 6.      Cranial Nerves: No cranial nerve deficit, dysarthria or facial asymmetry.      Sensory: Sensation is intact.      Motor: Tremor present. No weakness, abnormal muscle tone or seizure activity.      Coordination: Romberg sign negative. Coordination normal. Finger-Nose-Finger Test and Heel to [facility] Test normal.      Gait: Tandem walk abnormal. Gait normal.      Deep Tendon Reflexes: Babinski sign absent on the right side. Babinski sign absent on the left side.      Reflex Scores:       Tricep reflexes are 1+ on the right side and 1+ on the left side.       Bicep reflexes are 2+ on the right side and 2+ on the left side.       Brachioradialis reflexes are 1+ on the right side and 2+ on the left side.       Patellar reflexes are 0 on the right side and 0 on the left side.       Achilles reflexes are 2+ on the right side and 2+ on the left side.     Comments: Left eye with left visual field deficit. R eye visual fields intact.     PERRL, but pupils wide bilaterally.    Psychiatric:         Mood and Affect: Mood normal.         Behavior: Behavior normal.         ED Course       ED Course as of Jun 19 1728   Tue Jun 19, 2020   1627 Paged Neurosurgery for evaluation.              Coding

## 2020-06-19 NOTE — Unmapped (Signed)
Emergency Department Provider Note        ED Clinical Impression     Final diagnoses:   Glioblastoma (CMS-HCC) (Primary)       ED Assessment/Plan   had Darrell [name] is a 34 y.o. 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 Theodoro Kalata for neurosurgical evaluation in the setting of worsening headaches, memory deficits, worsening vision, and vomiting for the past week. MRI performed earlier today with evidence of cystic component of the lesion having increased in size from prior, likely contributing to increased ICP and current symptoms. Fortunately no new fixed neurological deficits, so no emergent need for surgery, but will require expedited treatment. Will discuss with Neurosurgical team here at [facility] as patient had original resection at [facility].   - Consult Neurosurgery  - CBC, CMP, PT-INR, APTT, Type and screen, COVID-19 swab  - will trial oxycodone for pain as patient states percocet has worked well in the past.     History     Chief Complaint   Patient presents with   ??? Headache Re-evaluation       Darrell [name] is a 34 y.o. 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 Theodoro Kalata for neurosurgical evaluation in the setting of worsening headaches, memory deficits, worsening vision, and vomiting for the past week.  Reports daily nausea with several episodes of vomiting, primarily in the morning.  Notes that he also has more trouble with his gait in the morning, and feels that he has trouble maintaining his balance, particularly in the morning.  This tends to improve by the early afternoon, that the headache itself can come on later in the day as well. Headache is primarily in the right frontal area, and is constantly present, but becomes severe at times to the point of 10/10 pain.  No response to Fioricet, Tylenol, or ibuprofen.  States that he has no fixed neurological deficits, other than the persistent loss of vision, for which she is seeing neuro-ophthalmology, and resting tremor, which she states is actually improved since finishing chemotherapy.  Denies any bowel or bladder dysfunction, recent seizures (is on Keppra due to seizures early in his course), fevers, change in concentration or mentation, difficulty speaking, dysphagia, or weakness.  States that his prednisone dose was recently increased from 2 mg twice daily to 4 mg twice daily, and he has been feeling some tachycardia since this increase.  This happened when he was on higher dose steroids immediately postoperatively as well.  Denies any infectious symptoms.  States he has had decreased food intake, but has been maintaining hydration well.    Past Medical History:   Diagnosis Date   ??? Migraine        No past surgical history on file.    Family History   Problem Relation Age of Onset   ??? No Known Problems Mother    ??? No Known Problems Father    ??? No Known Problems Sister    ??? No Known Problems Brother    ??? No Known Problems Maternal Aunt    ??? No Known Problems Maternal Uncle    ??? No Known Problems Paternal Aunt    ??? No Known Problems Paternal Uncle    ???  No Known Problems Maternal Grandmother    ??? No Known Problems Maternal Grandfather    ??? No Known Problems Paternal Grandmother    ??? No Known Problems Paternal Grandfather    ??? No Known Problems Other    ??? Amblyopia Neg Hx    ??? Blindness Neg Hx    ??? Cancer Neg Hx    ??? Cataracts Neg Hx    ??? Diabetes Neg Hx    ??? Glaucoma Neg Hx    ??? Hypertension Neg Hx    ??? Macular degeneration Neg Hx    ??? Retinal detachment Neg Hx    ??? Strabismus Neg Hx    ??? Stroke Neg Hx    ??? Thyroid disease Neg Hx        Social History     Socioeconomic History   ??? Marital status: Single     Spouse name: Not on file   ??? Number of children: Not on file   ??? Years of education: Not on file   ??? Highest education level: Not on file   Occupational History   ??? Not on file   Tobacco Use   ??? Smoking status: Current Every Day Smoker     Packs/day: 0.40     Years: 10.00     Pack years: 4.00     Types: Cigarettes   ??? Smokeless tobacco: Never Used   Substance and Sexual Activity   ??? Alcohol use: Yes   ??? Drug use: Never   ??? Sexual activity: Not on file   Other Topics Concern   ??? Not on file   Social History Narrative   ??? Not on file     Social Determinants of Health     Financial Resource Strain:    ??? Difficulty of Paying Living Expenses:    Food Insecurity:    ??? Worried About Programme researcher, broadcasting/film/video in the Last Year:    ??? Barista in the Last Year:    Transportation Needs:    ??? Freight forwarder (Medical):    ??? Lack of Transportation (Non-Medical):    Physical Activity:    ??? Days of Exercise per Week:    ??? Minutes of Exercise per Session:    Stress:    ??? Feeling of Stress :    Social Connections:    ??? Frequency of Communication with Friends and Family:    ??? Frequency of Social Gatherings with Friends and Family:    ??? Attends Religious Services:    ??? Database administrator or Organizations:    ??? Attends Engineer, structural:    ??? Marital Status:        Review of Systems   Constitutional: Positive for appetite change. Negative for chills and fever.   HENT: Positive for sinus pain. Negative for congestion and trouble swallowing.    Eyes: Positive for visual disturbance.   Respiratory: Negative for cough and shortness of breath.    Cardiovascular: Positive for palpitations. Negative for chest pain and leg swelling.   Gastrointestinal: Negative for abdominal pain, blood in stool and diarrhea.   Genitourinary: Negative for difficulty urinating and dysuria.   Musculoskeletal: Positive for gait problem.   Skin: Negative for rash.   Neurological: Positive for tremors and headaches. Negative for seizures, syncope, speech difficulty and weakness.   Psychiatric/Behavioral: Positive for sleep disturbance. Negative for confusion.       Physical Exam     BP 151/100  -  Pulse 120  - Temp 36.7 ??C (98.1 ??F)  - Resp 18  - SpO2 100%     Physical Exam  Vitals reviewed.   Constitutional:       General: He is not in acute distress.     Appearance: Normal appearance. He is not toxic-appearing.      Comments: Occasionally grimacing in pain   HENT:      Head: Normocephalic and atraumatic.      Right Ear: Tympanic membrane normal.      Left Ear: Tympanic membrane normal.      Nose: Nose normal.      Mouth/Throat:      Mouth: Mucous membranes are moist.      Pharynx: Oropharynx is clear.   Eyes:      General: Visual field deficit present.      Extraocular Movements: Extraocular movements intact.      Conjunctiva/sclera: Conjunctivae normal.      Pupils: Pupils are equal, round, and reactive to light.   Cardiovascular:      Rate and Rhythm: Normal rate and regular rhythm.      Pulses: Normal pulses.      Heart sounds: Normal heart sounds.   Pulmonary:      Effort: Pulmonary effort is normal. No respiratory distress.      Breath sounds: Normal breath sounds. No wheezing, rhonchi or rales.   Abdominal:      General: Abdomen is flat. Bowel sounds are normal.      Palpations: Abdomen is soft. There is no mass.      Tenderness: There is no abdominal tenderness. There is no right CVA tenderness, left CVA tenderness or guarding.   Musculoskeletal:         General: No swelling or deformity. Normal range of motion.      Cervical back: Normal range of motion and neck supple. No tenderness.      Right lower leg: No edema.      Left lower leg: No edema.   Skin:     General: Skin is warm and dry.      Capillary Refill: Capillary refill takes less than 2 seconds.      Findings: No lesion or rash.   Neurological:      General: No focal deficit present.      Mental Status: He is alert and oriented to person, place, and time.      GCS: GCS eye subscore is 4. GCS verbal subscore is 5. GCS motor subscore is 6.      Cranial Nerves: No cranial nerve deficit, dysarthria or facial asymmetry.      Sensory: Sensation is intact.      Motor: Tremor present. No weakness, abnormal muscle tone or seizure activity.      Coordination: Romberg sign negative. Coordination normal. Finger-Nose-Finger Test and Heel to [facility] Test normal.      Gait: Tandem walk abnormal. Gait normal.      Deep Tendon Reflexes: Babinski sign absent on the right side. Babinski sign absent on the left side.      Reflex Scores:       Tricep reflexes are 1+ on the right side and 1+ on the left side.       Bicep reflexes are 2+ on the right side and 2+ on the left side.       Brachioradialis reflexes are 1+ on the right side and 2+ on the left side.       Patellar reflexes  are 0 on the right side and 0 on the left side.       Achilles reflexes are 2+ on the right side and 2+ on the left side.     Comments: Left eye with left visual field deficit. R eye visual fields intact.     PERRL, but pupils wide bilaterally.    Psychiatric:         Mood and Affect: Mood normal.         Behavior: Behavior normal.         ED Course       ED Course as of Jun 19 2032   Tue Jun 19, 2020   1627 Paged Neurosurgery for evaluation.        1729 Given Oxy 10 as Oxy 5 did not help with pain.      1729 Leukocytosis likely secondary to prednisone.  CMP otherwise unremarkable   WBC(!): 13.0    Pain not helped by oxy, will trial 8mg  morphine, ketorolac 15mg      2004 Per neurosurgery, patient is stable for discharge home, and will be scheduled for surgical intervention on Friday.  They request Covid swab for preprocedure asymptomatic screening     Discussed plan with patient. Headache relieved by morphine. Equivalent dose is oxycodone 15mg . Patient in agreement with plan to discharge with enough oxycodone to last untl surgery on Friday, requested some dissolvable zofran which was also provided.            MDM  Reviewed: previous chart and vitals  Reviewed previous: labs and MRI  Interpretation: MRI and labs  Total time providing critical care: < 30 minutes. This excludes time spent performing separately reportable procedures and services.  Consults: neurosurgery    Katherina Right, MD/[facility] - PGY1

## 2020-06-19 NOTE — Unmapped (Signed)
Patient called this morning to let me know that he will be on his way to the emergency room shortly.  He did not have a ride yesterday.    He was not sure what to tell them when he got to the ED.  I stated he should let them know he's been having intractable headaches with occasional nausea and vomiting.

## 2020-06-20 NOTE — Unmapped (Signed)
NEUROSURGERY CONSULT NOTE    Requesting Attending Physician:  Marney Setting, MD  Service Requesting Consult:  Emergency Medicine    Assessment and Recommendations  Darrell [name] is a 34 y.o. male not on ASA or anticoagulation with h/o GBM s/p resection (03/21/20 at [facility]) and chemoradiation who presents with worsening headaches and right temporal cystic enlargement on MRI. Clinically he is awake and only notable neurologic deficits are visual field cuts. He is currently on dexamethasone 4bid per his oncologist. His MRI demonstrates interval enlargement of a multiloculated right temporal cyst compared to a 5/1 MRI brain. Residual enhancement appears smaller compared to prior MRI. There is effacement of his right lateral ventricle and associated 2-50mm of midline shift.     We feel his expanding cyst is the etiology of his worsening headaches/nasuea/vomiting and would recommend surgical intervention. It is reasonable for him to follow up with his primary neurosurgeon at [facility], but patient would prefer to proceed with surgery at [facility] given he receives his oncologic care here. We discussed surgical intervention with the patient, including stereotactic aspiration vs open drainage and resection. Our concern with stereotactic drainage is the high risk of rapid re accumulation of the cyst and we feel this would not offer long term reprieve. Patient verbalized understanding and agreed he would prefer open surgical drainage/aspiration. We will tentatively plan for surgery on Friday 6/25. Final surgical plan pending multidisciplinary discussion and further discussion of risks benefits with patient. Given his reassuring clinical picture and strong family support we are comfortable with him discharging home and returning for surgery on Friday.    - Please obtain covid test  - Please obtain CBC, BMP, PT/INR, PTT and type and screen  - Tentatively planning for surgery on Friday 6/25    Patient was discussed with Casimiro Needle P. Ralene Bathe, MD and staffed with Renaldo Fiddler, MD. Please page the Neurosurgery consult pager at [phone] with questions/concerns.     Problem List  GBM    History of Present Illness  Darrell [name] is a 34 y.o.  male not on ASA or anticoagulation with h/o GBM s/p resection (03/21/20 at [facility]) and chemoradiation seen in consultation at the request of Marney Setting, MD for evaluation of worsening headaches and an enlarging right temporal cyst. Patient reports he has had headache since his surgery in March, but these headaches have progressively worsened over the past several weeks and are now severe in nature. He developed nausea/vomiting about 1 week ago, that has improved with marabinol and Zofran. He denies vomiting in the past several days. He denies new weakness or numbness. Family denies noticing changes in speech. He has had one seizure since his resection over 1 month ago and has been taking keppra since this time. He denies any new seizure events. He has been taking his dexamethasone as prescribed, currently 4mg  BID. No fevers or chills.    Review of Systems  A 10-system review of systems was conducted and was negative except as documented above in the HPI.  ______________________________________________________________________    Physical Exam  General: No acute distress; There is no height or weight on file to calculate BMI.    Neurological Exam:  Awake, alert  Ox3, conversant  PERRL  EOMI  Left temporal field cut, Right superior nasal quadrantanopia  FS  TML  5/5 x4  No PND    Cardiovascular:  Warm and well perfused with good pulses, no edema  Pulmonary:  Unlabored breathing, no pursed lips or wheezing, acyanotic  Abdomen:  Soft, nontender, nondistended    Neurological imaging reviewed  As reviewed above    The patient's available vitals, intake/output, medications, labs, and relevant neurological imaging were independently reviewed.    ---Historical data---    Allergies  Patient has no known allergies.    Medications  Reviewed in Epic.  Medications relevant to this consult listed below.          Past Medical History  Past Medical History:   Diagnosis Date   ??? Migraine        Past Surgical History  No past surgical history on file.    Family History  Family History   Problem Relation Age of Onset   ??? No Known Problems Mother    ??? No Known Problems Father    ??? No Known Problems Sister    ??? No Known Problems Brother    ??? No Known Problems Maternal Aunt    ??? No Known Problems Maternal Uncle    ??? No Known Problems Paternal Aunt    ??? No Known Problems Paternal Uncle    ??? No Known Problems Maternal Grandmother    ??? No Known Problems Maternal Grandfather    ??? No Known Problems Paternal Grandmother    ??? No Known Problems Paternal Grandfather    ??? No Known Problems Other    ??? Amblyopia Neg Hx    ??? Blindness Neg Hx    ??? Cancer Neg Hx    ??? Cataracts Neg Hx    ??? Diabetes Neg Hx    ??? Glaucoma Neg Hx    ??? Hypertension Neg Hx    ??? Macular degeneration Neg Hx    ??? Retinal detachment Neg Hx    ??? Strabismus Neg Hx    ??? Stroke Neg Hx    ??? Thyroid disease Neg Hx        Social History  Social History     Tobacco Use   ??? Smoking status: Current Every Day Smoker     Packs/day: 0.40     Years: 10.00     Pack years: 4.00     Types: Cigarettes   ??? Smokeless tobacco: Never Used   Substance Use Topics   ??? Alcohol use: Yes   ??? Drug use: Never

## 2020-06-20 NOTE — Unmapped (Signed)
Hi,     Patient contacted the Communication Center requesting to speak with the care team of [place] to discuss:    Questions and concerns regarding up coming surgery.    Please contact patient at [phone].      Check Indicates criteria has been reviewed and confirmed with the patient:    [x]  Preferred Name   [x]  DOB and/or MR#  [x]  Preferred Contact Method  [x]  Phone Number(s)   []  MyChart     Thank you,   Christell Faith  [facility]   [phone]

## 2020-06-21 NOTE — Unmapped (Signed)
Pt called LVM stating he is having surgery tomorrow and he didn't know the time of surgery and had a couple of questions for the nurse. Requesting a call back.

## 2020-06-22 ENCOUNTER — Encounter
Admit: 2020-06-22 | Discharge: 2020-06-23 | Disposition: A | Discharge status: Home / Self Care | Payer: PRIVATE HEALTH INSURANCE

## 2020-06-22 ENCOUNTER — Encounter: Admit: 2020-06-22 | Discharge: 2020-06-23 | Disposition: A | Discharge status: Home / Self Care | Payer: MEDICAID

## 2020-06-22 ENCOUNTER — Ambulatory Visit: Admit: 2020-06-22 | Discharge: 2020-06-23 | Disposition: A | Discharge status: Home / Self Care | Payer: MEDICAID

## 2020-06-22 LAB — CBC
HEMOGLOBIN: 14.6 g/dL (ref 13.5–17.5)
MEAN CORPUSCULAR HEMOGLOBIN CONC: 33.1 g/dL (ref 31.0–37.0)
MEAN CORPUSCULAR HEMOGLOBIN: 33.4 pg (ref 26.0–34.0)
MEAN CORPUSCULAR VOLUME: 100.9 fL — ABNORMAL HIGH (ref 80.0–100.0)
MEAN PLATELET VOLUME: 9.3 fL (ref 7.0–10.0)
PLATELET COUNT: 319 10*9/L (ref 150–440)
RED BLOOD CELL COUNT: 4.38 10*12/L — ABNORMAL LOW (ref 4.50–5.90)
RED CELL DISTRIBUTION WIDTH: 12.6 % (ref 12.0–15.0)
WBC ADJUSTED: 11.7 10*9/L — ABNORMAL HIGH (ref 4.5–11.0)

## 2020-06-22 LAB — APTT
APTT: 26.6 s (ref 25.3–37.1)
Coagulation surface induced:Time:Pt:PPP:Qn:Coag: 26.6

## 2020-06-22 LAB — BASIC METABOLIC PANEL
ANION GAP: 5 mmol/L — ABNORMAL LOW (ref 7–15)
BLOOD UREA NITROGEN: 8 mg/dL (ref 7–21)
BUN / CREAT RATIO: 13
CALCIUM: 8.7 mg/dL (ref 8.5–10.2)
CHLORIDE: 107 mmol/L (ref 98–107)
CO2: 23 mmol/L (ref 22.0–30.0)
CREATININE: 0.6 mg/dL — ABNORMAL LOW (ref 0.70–1.30)
EGFR CKD-EPI NON-AA MALE: >90 mL/min/{1.73_m2} (ref >=60)
GLUCOSE RANDOM: 130 mg/dL (ref 70–179)
SODIUM: 135 mmol/L (ref 135–145)

## 2020-06-22 LAB — BLOOD GAS CRITICAL CARE PANEL, ARTERIAL
BASE EXCESS ARTERIAL: 2.5 — ABNORMAL HIGH (ref -2.0–2.0)
CALCIUM IONIZED ARTERIAL (MG/DL): 4.59 mg/dL (ref 4.40–5.40)
GLUCOSE WHOLE BLOOD: 113 mg/dL (ref 70–179)
HCO3 ARTERIAL: 26 mmol/L (ref 22–27)
HEMOGLOBIN BLOOD GAS: 14.5 g/dL (ref 13.50–17.50)
LACTATE BLOOD ARTERIAL: 0.9 mmol/L (ref <1.3)
O2 SATURATION ARTERIAL: 99.2 % (ref 94.0–100.0)
PCO2 ARTERIAL: 39.4 mmHg (ref 35.0–45.0)
PH ARTERIAL: 7.44 (ref 7.35–7.45)
POTASSIUM WHOLE BLOOD: 4 mmol/L (ref 3.4–4.6)
SODIUM WHOLE BLOOD: 140 mmol/L (ref 135–145)

## 2020-06-22 LAB — MEAN PLATELET VOLUME: Platelet mean volume:EntVol:Pt:Bld:Qn:Automated count: 9.3

## 2020-06-22 LAB — EGFR CKD-EPI NON-AA MALE
Glomerular filtration rate/1.73 sq M.predicted.non black:ArVRat:Pt:Ser/Plas/Bld:Qn:Creatinine-based formula (CKD-EPI): >90

## 2020-06-22 LAB — MAGNESIUM: Magnesium:MCnc:Pt:Ser/Plas:Qn:: 2.1

## 2020-06-22 LAB — PROTIME-INR: INR: 0.96

## 2020-06-22 LAB — PROTIME: Coagulation tissue factor induced:Time:Pt:PPP:Qn:Coag: 11.5

## 2020-06-22 LAB — BASE EXCESS ARTERIAL: Base excess:SCnc:Pt:BldA:Qn:Calculated: 2.5 — ABNORMAL HIGH

## 2020-06-22 MED ADMIN — sodium chloride irrigation (NS) 0.9 % irrigation solution: @ 20:00:00 | Stop: 2020-06-22

## 2020-06-22 MED ADMIN — MORPhine 4 mg/mL injection: INTRAVENOUS | @ 23:00:00 | Stop: 2020-06-22

## 2020-06-22 MED ADMIN — bacitracin ointment: TOPICAL | @ 18:00:00 | Stop: 2020-06-22

## 2020-06-22 MED ADMIN — fentaNYL (PF) (SUBLIMAZE) injection: INTRAVENOUS | Stop: 2020-06-22

## 2020-06-22 MED ADMIN — phenylephrine 20 mg in sodium chloride 0.9% 250 mL (80 mcg/mL) infusion PMB: INTRAVENOUS | @ 20:00:00 | Stop: 2020-06-22

## 2020-06-22 MED ADMIN — esmoloL (BREVIBLOC) injection: INTRAVENOUS | Stop: 2020-06-22

## 2020-06-22 MED ADMIN — propofoL (DIPRIVAN) injection: INTRAVENOUS | @ 18:00:00 | Stop: 2020-06-22

## 2020-06-22 MED ADMIN — ceFAZolin in 50ml iso-osmotic dextrose (ANCEF) 2 gram/50 mL IVPB: INTRAVENOUS | @ 19:00:00 | Stop: 2020-06-22

## 2020-06-22 MED ADMIN — levETIRAcetam (KEPPRA) injection: INTRAVENOUS | @ 19:00:00 | Stop: 2020-06-22

## 2020-06-22 MED ADMIN — microfibril. collagen hemostat (INSTAT) packet: TOPICAL | @ 22:00:00 | Stop: 2020-06-22

## 2020-06-22 MED ADMIN — fentaNYL (PF) (SUBLIMAZE) injection: INTRAVENOUS | @ 18:00:00 | Stop: 2020-06-22

## 2020-06-22 MED ADMIN — cellulose, oxidized reg 4"X 8" pad: TOPICAL | @ 18:00:00 | Stop: 2020-06-22

## 2020-06-22 MED ADMIN — propofol (DIPRIVAN) infusion 10 mg/mL: INTRAVENOUS | @ 18:00:00 | Stop: 2020-06-22

## 2020-06-22 MED ADMIN — cellulose, oxidized reg 1"X 2" pad (SURGICEL FIBRILLAR): TOPICAL | @ 18:00:00 | Stop: 2020-06-22

## 2020-06-22 MED ADMIN — ROCuronium (ZEMURON) injection: INTRAVENOUS | @ 18:00:00 | Stop: 2020-06-22

## 2020-06-22 MED ADMIN — matrix hemostatic sealant (FLOSEAL) topical: TOPICAL | @ 18:00:00 | Stop: 2020-06-22

## 2020-06-22 MED ADMIN — gelatin absorbable (GELFOAM) 100 sponge: TOPICAL | @ 18:00:00 | Stop: 2020-06-22

## 2020-06-22 MED ADMIN — thrombin (recombinant) topical: TOPICAL | @ 18:00:00 | Stop: 2020-06-22

## 2020-06-22 MED ADMIN — remifentaniL (ULTIVA) 1,000 mcg in sodium chloride (NS) 20 mL: INTRAVENOUS | @ 18:00:00 | Stop: 2020-06-22

## 2020-06-22 MED ADMIN — ROCuronium (ZEMURON) injection: INTRAVENOUS | @ 19:00:00 | Stop: 2020-06-22

## 2020-06-22 MED ADMIN — sodium chloride (NS) 0.9 % infusion: INTRAVENOUS | @ 18:00:00 | Stop: 2020-06-22

## 2020-06-22 MED ADMIN — lidocaine (XYLOCAINE) 20 mg/mL (2 %) injection: INTRAVENOUS | @ 18:00:00 | Stop: 2020-06-22

## 2020-06-22 MED ADMIN — acetaminophen (TYLENOL) tablet 975 mg: 975 mg | ORAL | @ 18:00:00 | Stop: 2020-06-22

## 2020-06-22 MED ADMIN — dexamethasone (DECADRON) 4 mg/mL injection: INTRAVENOUS | @ 19:00:00 | Stop: 2020-06-22

## 2020-06-22 MED ADMIN — ondansetron (ZOFRAN) injection: INTRAVENOUS | @ 23:00:00 | Stop: 2020-06-22

## 2020-06-22 MED ADMIN — lidocaine-EPINEPHrine (XYLOCAINE W/EPI) 1 %-1:100,000 injection: @ 19:00:00 | Stop: 2020-06-22

## 2020-06-22 MED ADMIN — dexmedeTOMIDine (PRECEDEX) injection: INTRAVENOUS | Stop: 2020-06-22

## 2020-06-23 LAB — PHOSPHORUS: Phosphate:MCnc:Pt:Ser/Plas:Qn:: 3.7

## 2020-06-23 LAB — POTASSIUM: Potassium:SCnc:Pt:Ser/Plas:Qn:: 4.4

## 2020-06-23 MED ORDER — DEXAMETHASONE 1 MG TABLET
ORAL_TABLET | ORAL | 0 refills | 8 days | Status: CP
Start: 2020-06-23 — End: 2020-07-01

## 2020-06-23 MED ORDER — OXYCODONE 5 MG TABLET
ORAL_TABLET | ORAL | 0 refills | 4.00000 days | Status: CP | PRN
Start: 2020-06-23 — End: 2020-06-28

## 2020-06-23 MED ADMIN — senna (SENOKOT) tablet 2 tablet: 2 | ORAL | @ 12:00:00 | Stop: 2020-06-23

## 2020-06-23 MED ADMIN — levETIRAcetam (KEPPRA) tablet 1,000 mg: 1000 mg | ORAL | @ 02:00:00

## 2020-06-23 MED ADMIN — oxyCODONE (ROXICODONE) immediate release tablet 10 mg: 10 mg | ORAL | @ 16:00:00 | Stop: 2020-06-23

## 2020-06-23 MED ADMIN — fentaNYL (PF) (SUBLIMAZE) 50 mcg/mL injection: INTRAVENOUS | @ 01:00:00 | Stop: 2020-06-22

## 2020-06-23 MED ADMIN — famotidine (PEPCID) tablet 40 mg: 40 mg | ORAL | @ 02:00:00 | Stop: 2020-06-22

## 2020-06-23 MED ADMIN — fentaNYL (PF) (SUBLIMAZE) injection: INTRAVENOUS | Stop: 2020-06-22

## 2020-06-23 MED ADMIN — fentaNYL (PF) (SUBLIMAZE) injection 50 mcg: 50 ug | INTRAVENOUS | @ 02:00:00 | Stop: 2020-06-22

## 2020-06-23 MED ADMIN — acetaminophen (TYLENOL) tablet 650 mg: 650 mg | ORAL | @ 16:00:00 | Stop: 2020-06-23

## 2020-06-23 MED ADMIN — fentaNYL (PF) (SUBLIMAZE) injection 25 mcg: 25 ug | INTRAVENOUS | @ 01:00:00 | Stop: 2020-06-22

## 2020-06-23 MED ADMIN — ondansetron (ZOFRAN) 4 mg/2 mL injection: INTRAVENOUS | @ 01:00:00 | Stop: 2020-06-22

## 2020-06-23 MED ADMIN — ondansetron (ZOFRAN) injection 4 mg: 4 mg | INTRAVENOUS | @ 01:00:00

## 2020-06-23 MED ADMIN — oxyCODONE (ROXICODONE) immediate release tablet 5 mg: 5 mg | ORAL | @ 02:00:00 | Stop: 2020-07-06

## 2020-06-23 MED ADMIN — oxyCODONE (ROXICODONE) immediate release tablet 10 mg: 10 mg | ORAL | @ 08:00:00 | Stop: 2020-06-23

## 2020-06-23 MED ADMIN — MORPhine 4 mg/mL injection 4 mg: 4 mg | INTRAVENOUS | @ 03:00:00

## 2020-06-23 MED ADMIN — dexAMETHasone (DECADRON) tablet 4 mg: 4 mg | ORAL | @ 11:00:00 | Stop: 2020-06-23

## 2020-06-23 MED ADMIN — acetaminophen (TYLENOL) tablet 650 mg: 650 mg | ORAL | @ 12:00:00 | Stop: 2020-06-23

## 2020-06-23 MED ADMIN — sodium chloride (NS) 0.9 % infusion: 75 mL/h | INTRAVENOUS | @ 02:00:00

## 2020-06-23 MED ADMIN — baclofen (LIORESAL) tablet 20 mg: 20 mg | ORAL | @ 02:00:00

## 2020-06-23 MED ADMIN — ceFAZolin (ANCEF) IVPB 2 g in 50 ml dextrose (premix): 2 g | INTRAVENOUS | @ 04:00:00 | Stop: 2020-06-23

## 2020-06-23 MED ADMIN — ALPRAZolam (XANAX) tablet 1 mg: 1 mg | ORAL | @ 05:00:00 | Stop: 2020-06-23

## 2020-06-23 MED ADMIN — fentaNYL (PF) (SUBLIMAZE) 50 mcg/mL injection: INTRAVENOUS | @ 02:00:00 | Stop: 2020-06-22

## 2020-06-23 NOTE — Unmapped (Signed)
Speech Language Pathology Cognitive Linguistic Evaluation  06/23/2020    Patient Name:  Darrell [name]       Medical Record Number: [id number]   Date of Birth: August 28, 1986  Sex: Male          SLP Treatment Diagnosis:  r/o dysphagia; cognitive-linguistic impairment    Assessment   Pt seen this date for cognitive-linguistic assessment. Assessment utilized portions of the Cognistat, Cognitive-Linguistic Quick Test, Behavioral Inattention Test, Cognitive Assessment of [place], Western Aphasia Battery, as well as several informal measures. Pt presents with overall mild cognitive impairments. Testing this date significant for moderate impairments on executive function tasks (clock drawing and foresight/planning maze). Pt scored WFL on all other subtests administered targeting orientation, mental calculations, short-term memory, abstract reasoning, safety/judgment problem solving, and expressive/receptive language. Pt was awake/alert and participatory throughout, with speech fluent and intelligible though often fast-paced. Pt also modestly impulsive and intermittently tangential with poor topic maintenance. Pt endorses that since his initial surgery at [facility], he feels he has experienced increased difficulty with short-term memory and higher level problem-solving. ST provided education re: cognitive-behavioral sequelae of tumor resection, reason for assessment, results of assessment, role of ST, and current ST recommendations. Pt reports he is motivated to pursue ongoing skilled ST and/or higher level neuropsychology dx/tx at discharge to address cognitive changes. Currently anticipate pt to benefit from ongoing ST and/or higher level neuropsych services and some assistance with higher level cognitive ADLs at discharge.    ST to follow per POC.    Recommended Compensatory Techniques: Hand held memory aids, Smart phone alarms and reminders, Provide extra time    Prognosis: Good  Positive Indicators: + youth, participation (-) impulsivity        Plan of Care  SLP Follow-up / Frequency: 1x per day, 2-3x week Planned Treatment Duration : 2 weeks    Treatment Goals:  Short Term Goal 1: Pt will participate in ongoing cognitive linguistic assessment to determine variance from baseline and assist in d/c planning   Time Frame : 2 weeks   Short Term Goal 2: Pt will perform functional basic to semi-complex executive function tasks to address error awareness, planning, problem solving and sequencing with 80% accuracy given minimal assist.   Time Frame : 2 weeks       Planned Interventions: Cognitive-Communication Treatment, Compensatory Strategy Training, Metacognitive Skills Training         Post Acute Discharge Recommendations  Post Acute SLP Discharge Recommendations: 5x weekly    Subjective    Prior Functional Status: Independent prior to admission, Unemployed, Did not drive prior to admission  Lives With: Family  Educational Status: High school diploma (2 years college in Scientist, physiological)     Pt reports PTA he was independent in all cognitive ADLs. Lives with brother, not currently working and no longer drives since initial surgery. Reports changes in cognitive functioning since initial surgery at [facility], especially STM  Current Functional Status: Darrell [name] is a 34 y.o. male with a GBM s/p redo R temporal crani for resection and cyst fenestration (06/22/2020). Pt seen this date for diet tolerance check and cognitive linguistic assessment. Pt awake/alert on room air with brother at bedside, agreeable to ST session.  Communication Preference: Verbal  Patient/Caregiver Reports: Pt reports increased difficulty to short term memory since initial surgery at [facility]     Allergies: Patient has no known allergies.  Current Facility-Administered Medications   Medication Dose Route Frequency Provider Last Rate Last Admin   ??? acetaminophen (TYLENOL) tablet  650 mg  650 mg Oral Q4H While awake Naaman Plummer, MD   650 mg at 06/23/20 0810   ??? albuterol (PROVENTIL HFA;VENTOLIN HFA) 90 mcg/actuation inhaler 2 puff  2 puff Inhalation Q4H PRN Loanne Drilling, ACNP       ??? ALPRAZolam (XANAX) tablet 1 mg  1 mg Oral BID PRN Naaman Plummer, MD   1 mg at 06/23/20 0032   ??? baclofen (LIORESAL) tablet 20 mg  20 mg Oral TID Naaman Plummer, MD   20 mg at 06/23/20 0827   ??? dexAMETHasone (DECADRON) tablet 4 mg  4 mg Oral BID with meals Tacey Ruiz, ACNP   4 mg at 06/23/20 1610   ??? dextrose 50 % in water (D50W) 50 % solution 12.5 g  12.5 g Intravenous Q10 Min PRN Naaman Plummer, MD       ??? insulin lispro (HumaLOG) injection 0-12 Units  0-12 Units Subcutaneous ACHS Naaman Plummer, MD       ??? levETIRAcetam (KEPPRA) tablet 1,000 mg  1,000 mg Oral BID Naaman Plummer, MD   1,000 mg at 06/23/20 9604   ??? melatonin tablet 3 mg  3 mg Oral Nightly PRN Tacey Ruiz, ACNP       ??? MORPhine 4 mg/mL injection 4 mg  4 mg Intravenous Q4H PRN Loanne Drilling, ACNP       ??? nortriptyline (PAMELOR) capsule 50 mg  50 mg Oral Nightly Loanne Drilling, ACNP       ??? ondansetron (ZOFRAN) injection 4 mg  4 mg Intravenous Q4H PRN Naaman Plummer, MD   4 mg at 06/22/20 2115   ??? oxyCODONE (ROXICODONE) immediate release tablet 10 mg  10 mg Oral Q4H PRN Tacey Ruiz, ACNP   10 mg at 06/23/20 5409   ??? senna (SENOKOT) tablet 2 tablet  2 tablet Oral Daily Tacey Ruiz, ACNP   2 tablet at 06/23/20 0827   ??? sodium chloride (NS) 0.9 % infusion  75 mL/hr Intravenous Continuous Naaman Plummer, MD   Stopped at 06/23/20 0543   ??? traZODone (DESYREL) tablet 50 mg  50 mg Oral Nightly Loanne Drilling, ACNP          Patient Active Problem List    Diagnosis Date Noted   ??? Glioblastoma (CMS-HCC) 04/17/2020   ??? Anxiety 04/17/2020     Past Medical History:   Diagnosis Date   ??? Migraine        No past surgical history on file.  Social History     Tobacco Use   ??? Smoking status: Current Every Day Smoker     Packs/day: 0.40 Years: 10.00     Pack years: 4.00     Types: Cigarettes   ??? Smokeless tobacco: Never Used   Substance Use Topics   ??? Alcohol use: Yes     Family History   Problem Relation Age of Onset   ??? No Known Problems Mother    ??? No Known Problems Father    ??? No Known Problems Sister    ??? No Known Problems Brother    ??? No Known Problems Maternal Aunt    ??? No Known Problems Maternal Uncle    ??? No Known Problems Paternal Aunt    ??? No Known Problems Paternal Uncle    ??? No Known Problems Maternal Grandmother    ??? No Known Problems Maternal Grandfather    ??? No Known Problems Paternal Grandmother    ???  No Known Problems Paternal Grandfather    ??? No Known Problems Other    ??? Amblyopia Neg Hx    ??? Blindness Neg Hx    ??? Cancer Neg Hx    ??? Cataracts Neg Hx    ??? Diabetes Neg Hx    ??? Glaucoma Neg Hx    ??? Hypertension Neg Hx    ??? Macular degeneration Neg Hx    ??? Retinal detachment Neg Hx    ??? Strabismus Neg Hx    ??? Stroke Neg Hx    ??? Thyroid disease Neg Hx        General:   Medical Tests / Procedures Comments: MRI brain 5/1: Evolving postsurgical sequela related to right frontotemporal glioblastoma resection. Multiple cystic foci and multifocal enhancing lesions in the right cerebrum and within the right lateral ventricle, compatible with residual tumor and new/progressed from prior.  Pain: no s/s of pain     Equipment/Environment: Caregiver wearing mask for full session, Patient not wearing mask for full session (SLP donned mask/protective eyewear t/o)  Services patient receives: PT, OT, SLP    Precautions / Restrictions  Precautions: Falls precautions, Aspiration precautions  Required Braces or Orthoses: Non-applicable    Objective  Swallow Status: Pt currently on PO diet of regular solids/thin liquids per MD, tolerating without c/f aspiration. Pt this date tolerated PO trials of Level 0/thin liquids, regular solids, and mixed consistencies without overt clinical s/sx of aspiration or oropharyngeal mismanagement.  Motor Speech: WFL   Oral Mechanism : WFL   Orientation: 11/12 orientation (Cognistat), WFL.  Attention: Attention is within functional limits  Attention Comments: Awake/alert and attentive, mildly tangential  Memory: Short term memory is within functional limits, Long term memory is within functional limits, Will be assessed further in therapy  Memory Comments: 11/12 4-word recall (Cognistat), [facility]. Able to provide basic biographical info. Reports mild difficulty with STM PTA  Problem Solving: Problem solving is impaired, Planning skills are impaired, Time management is within functional limits, Abstract reasoning is within functional limits, Appears diminished, needs further assessment, Will be assessed further in therapy  Problem Solving Comments: 9/13 clock drawing (CLQT), a moderate impairment, with pt oriented all numbers to R side of clock and drawing hands to 11. 6/8 maze (CAM), a moderate impairment. 4/4 calculations (Cognistat), [facility]. 7/8 abstract reasoning (Cognistat), WFL.  Safety/Judgement: Safety/Judgement is within functional limits  Safety/Judgement Comments: 6/6 judgement (Cognistat), WFL. 7/8 safety/judgement (CAM), WFL  Insight/Mental Flexibility/Initiation/Processing: Insight is within functional limits, Mild impulsivity, Task initiation is within functional limits, Flexibility of thought is within functional limits, Processing speed is within functional limits, Perseveration is not present  Verbal Expression: Verbal Expression is within functional limits  Verbal Expression Comments: Speech fluent and intelligible, fast paced. 10/10 sentence completion and responsive speech (WAB), [facility]  Auditory Comprehension: Auditory Comprehension is within functional limits  Comprehension Comments: Responsive and able to follow single/multistep commands. 10/10 basic to semicomplex yes/no ?s  Pragmatics/Prosody: Affect is within functional limits, Eye contact is within functional limits, Turn Taking is within functional limits, Prosody is within functional limits, Topic initiation is impaired, Topic Maintenance is impaired  Pragmatics/Prosody Comments: Grossly pragmatically appropriate throughout, fast paced speech and somewhat tangential/off-topic at times  Written Expression: Right hand dominant  Written Expression Comments: handwriting legible for clock drawing  Reading Comprehension: Single word reading is intact  Other Reading Comments: able to read instructions aloud for clock drawing task   Visual Processing: 40/40 line crossing (BIT), WFL. Oriented numbers to R  sided of clock on CLQT task, ?L visual inattention    Activity Tolerance: Patient tolerated treatment well  Medical Staff Made Aware: RN    Speech Therapy Session Duration   Speech Therapy Session Duration  SLP Individual [mins]: 17      I attest that I have reviewed the above information.  Signed: Stacie Acres, SLP  Ceasar Mons 06/23/2020

## 2020-06-23 NOTE — Unmapped (Signed)
NEUROSURGERY PROGRESS NOTE    Brief History of Present Illness  Darrell [name] is a 33 y.o. male with a GBM s/p redo R temporal crani for resection and cyst fenestration (06/22/2020)    Subjective/Interval History  Doing well, exam stable.    Interval Imaging Reviewed  None    Neurological Assessment and Plan  **Glioblastoma (CMS-HCC)  - POD 1  - Fu path  - dex 4 bid  - keppra 1g bid (home)  - periop abx  - MRI when able  - PT/OT/ST      Anticoagulant/Antiplatelet needs: None    Disposition: ICU    This patient is co-managed with the [facility]. The management of this patient was discussed in detail with them and we agree with their overall assessment and plan.      ___________________________________________________________________    Neurological Exam  Eyes open spontaneously  Oriented to person place and time; easily following commands  Left homonymous hemianopsia, o/w VFF  PERRL, EOMI, FS  RUE 5/5  LUE 5/5  RLE 5/5   LLE 5/5   No pronator drift    Incision c/d/i, absorbable sutures    The patient's vitals, intake/output, labs, orders, and relevant imaging were reviewed for the last 24 hours.    Problem List  Principal Problem:    Glioblastoma (CMS-HCC)  Resolved Problems:    * No resolved hospital problems. *

## 2020-06-23 NOTE — Unmapped (Signed)
PHYSICAL THERAPY  Evaluation (06/23/20 0815)     Patient Name:  Darrell [name]       Medical Record Number: [id number]   Date of Birth: 09/26/1986  Sex: Male                 Activity Tolerance: Tolerated treatment well    ASSESSMENT        Assessment : Darrell Lampton is a 34 y.o. male with a GBM s/p redo R temporal crani for resection and cyst fenestration (06/22/2020). Patient is currently independent with all mobility at this time and has good home support from his brother. Patient did not voice any further concerns for PT. Patient is considered a low complexity case. No further PT needs.     Today's Interventions: AMPAC 24/24; PT evaluation; educated in home safety, falls prevention and avoidance of dependent head positioning and strenuous activity      PLAN  Planned Frequency of Treatment:  D/C Services for: D/C Services      Post-Discharge Physical Therapy Recommendations:  PT services not indicated    PT DME Recommendations: None           Goals:   Patient and Family Goals: to go home today.-pt report    Prognosis:  Good  Positive Indicators: current level of function  Barriers to Discharge: None    SUBJECTIVE     Patient reports: patient in agreement with PT session  Current Functional Status: patient received and ended session standing in room in the NSICU, RN Rosanne Ashing) cleared session, patient's brother present in room, RN in room at end of session  Services patient receives: PT, OT, SLP  Prior Functional Status: independent with all mobility and ADLs with no device; denies falls  Equipment available at home: [employer], Bedside commode, Shower chair with back (patient reports he has DME at home but currently does not use them)     Past Medical History:   Diagnosis Date   ??? Migraine     Social History     Tobacco Use   ??? Smoking status: Current Every Day Smoker     Packs/day: 0.40     Years: 10.00     Pack years: 4.00     Types: Cigarettes   ??? Smokeless tobacco: Never Used   Substance Use Topics   ??? Alcohol use: Yes      No past surgical history on file. Family History   Problem Relation Age of Onset   ??? No Known Problems Mother    ??? No Known Problems Father    ??? No Known Problems Sister    ??? No Known Problems Brother    ??? No Known Problems Maternal Aunt    ??? No Known Problems Maternal Uncle    ??? No Known Problems Paternal Aunt    ??? No Known Problems Paternal Uncle    ??? No Known Problems Maternal Grandmother    ??? No Known Problems Maternal Grandfather    ??? No Known Problems Paternal Grandmother    ??? No Known Problems Paternal Grandfather    ??? No Known Problems Other    ??? Amblyopia Neg Hx    ??? Blindness Neg Hx    ??? Cancer Neg Hx    ??? Cataracts Neg Hx    ??? Diabetes Neg Hx    ??? Glaucoma Neg Hx    ??? Hypertension Neg Hx    ??? Macular degeneration Neg Hx    ??? Retinal detachment Neg Hx    ???  Strabismus Neg Hx    ??? Stroke Neg Hx    ??? Thyroid disease Neg Hx         Allergies: Patient has no known allergies.                Objective Findings  Precautions / Restrictions  Precautions: Falls precautions  Weight Bearing Status: Non-applicable  Required Braces or Orthoses: Non-applicable    Communication Preference: Verbal   Pain Comments: patient reporting mild pain around surgical incision; RN made aware  Medical Tests / Procedures: reviewed in EPIC  Equipment / Environment: Patient not wearing mask for full session, Caregiver not wearing mask for full session, Vascular access (PIV, TLC, Port-a-cath, PICC), Telemetry    At Rest: VSS per monitor  With Activity: VSS/NAD  Orthostatics: asymptomatic throughout session       Living Situation  Living Environment: House  Lives With: Sibling(s) (patient lives with his brother and his brother's girlfriend; reports that someone is usually home during the day to assist as needed)  Home Living: Two level home, Able to Live on main level with bedroom/bathroom, Ramped entrance, Tub/shower unit, Standard height toilet     Cognition  Cognition:  (patient alert and oriented and able to follow commands)  Visual / Perception status  Visual/Perception: Wears Glasses/Contacts (patient denies acute vision changes; patient endorses chronic field cut deficits)       UE ROM / Strength  UE comment: BUE WFL  LE ROM / Strength  LE comment: BLE WFL              Balance: independent sitting and standing balance   Posture: WFL     Bed Mobility: not formally assessed as patient received and ended session standing in room; patient reporting independence with bed mobility and denied concerns  Transfers: independent sitting to and from standing transfers and pivoting transfers with no device; no loss of balance   Gait  Level of Assistance: Independent  Assistive Device: None  Distance Ambulated (ft): 400 ft  Gait: steady reciprocal gait pattern; able to make starts, stops, turns, retrostep and direction changes with no loss of balance  Stairs: patient reports ramped entrance into the home           Physical Therapy Session Duration  PT Individual [mins]: 12    Medical Staff Made Aware: RN Rosanne Ashing) aware of patient's status    I attest that I have reviewed the above information.  Signed: Felipa Emory, PT  Filed 06/23/2020

## 2020-06-23 NOTE — Unmapped (Signed)
Problem: Adult Inpatient Plan of Care  Goal: Plan of Care Review  Outcome: Resolved  Note: Pt express desire to be discharged.  Medical devices removed. Discharge instructions given.  Accompanied by family member. Pain medication provided per request.  Opportunity provided for questions and concerns.    Goal: Patient-Specific Goal (Individualization)  Outcome: Resolved  Goal: Absence of Hospital-Acquired Illness or Injury  Outcome: Resolved  Goal: Optimal Comfort and Wellbeing  Outcome: Resolved  Goal: Readiness for Transition of Care  Outcome: Resolved  Goal: Rounds/Family Conference  Outcome: Resolved

## 2020-06-23 NOTE — Unmapped (Signed)
Neurocritical Care   Admission Note     CODE STATUS:  FULL    Date of service: 06/22/2020  Hospital Day:  LOS: 0 days        HPI     Darrell [name] is a 34 y.o. male with a history of hypertension, anxiety, and a GBM which was resected this past March [facility], Dr. Glendell Docker) who developed worsening headaches and was found to have a right temporal cystic enlargement on MRI. He presented today for resection and cyst fenestration and is now admitted to the NSICU post-operatively.    History     PAST MEDICAL HISTORY  Past Medical History:   Diagnosis Date   ??? Migraine        SURGICAL HISTORY  No past surgical history on file.    HOME MEDICATIONS  No current facility-administered medications on file prior to encounter.     Current Outpatient Medications on File Prior to Encounter   Medication Sig   ??? ondansetron (ZOFRAN-ODT) 8 MG disintegrating tablet Dissolve 1 tablet on the tongue 30 minutes before chemo and every 8 hours as needed for nausea.   ??? oxyCODONE (ROXICODONE) 15 MG immediate release tablet Take 1 tablet (15 mg total) by mouth every six (6) hours as needed (for severe pain not controlled by tylenol, esgic and ibuprofen) for up to 5 days.   ??? acetaminophen (TYLENOL) 325 MG tablet Take 650 mg by mouth daily as needed for pain.   ??? ALPRAZolam (XANAX) 1 MG tablet Take 1 mg by mouth two (2) times a day as needed.    ??? baclofen (LIORESAL) 20 MG tablet Take 20 mg by mouth Three (3) times a day.   ??? butalbital-acetaminophen-caffeine (ESGIC) 50-325-40 mg per tablet Take 1 tablet by mouth every four (4) hours as needed for pain.   ??? cholecalciferol, vitamin D3-50 mcg, 2,000 unit,, (VITAMIN D3) 50 mcg (2,000 unit) cap Take 50 mcg by mouth daily.   ??? dexAMETHasone (DECADRON) 2 MG tablet Take 1 tablet (2 mg total) by mouth 2 (two) times a day with meals.   ??? dronabinoL (MARINOL) 5 MG capsule Take 1 capsule (5 mg total) by mouth Three (3) times a day with a meal.   ??? ibuprofen (ADVIL,MOTRIN) 200 MG tablet Take 600 mg by mouth daily as needed for pain.   ??? levETIRAcetam (KEPPRA) 1000 MG tablet Take 1 tablet (1,000 mg total) by mouth Two (2) times a day.   ??? nortriptyline (PAMELOR) 25 MG capsule 1 tab PO at bedtime x 1 week, then increase to 2 tabs PO qhs   ??? ondansetron (ZOFRAN-ODT) 4 MG disintegrating tablet Take 2 tablets (8 mg total) by mouth every eight (8) hours as needed for nausea for up to 7 days.   ??? propranoloL (INDERAL) 20 MG tablet Take 20 mg by mouth Two (2) times a day.  (Patient not taking: Reported on 06/22/2020)   ??? sildenafiL (VIAGRA) 50 MG tablet Take 1 tablet (50 mg total) by mouth daily as needed for erectile dysfunction.   ??? sulfamethoxazole-trimethoprim (BACTRIM DS) 800-160 mg per tablet Take 1 tablet by mouth on Mondays, Wednesdays, and Fridays while on radiation therapy.   ??? temozolomide (TEMODAR) 140 mg capsule Take 1 capsule (140mg ) by mouth along with one 20 mg capsule (total dose 160 mg) once daily 30 minutes after zofran 1 hr prior to radiation and at bedtime on weekends for 42 consecutive days starting on day 1 of radiation.   ??? temozolomide (TEMODAR)  20 mg capsule Take 1 capsule (20mg ) by mouth along with one 140 mg capsule (total dose 160 mg) once daily 30 minutes after zofran 1 hr prior to radiation and at bedtime on weekends for 42 consecutive days starting on day 1 of radiation.   ??? traZODone (DESYREL) 50 MG tablet Take 0.5 - 1 tablets (25 - 50 mg) nightly as needed for sleep.       Allergies  Patient has no known allergies.    Family History  Family History   Problem Relation Age of Onset   ??? No Known Problems Mother    ??? No Known Problems Father    ??? No Known Problems Sister    ??? No Known Problems Brother    ??? No Known Problems Maternal Aunt    ??? No Known Problems Maternal Uncle    ??? No Known Problems Paternal Aunt    ??? No Known Problems Paternal Uncle    ??? No Known Problems Maternal Grandmother    ??? No Known Problems Maternal Grandfather    ??? No Known Problems Paternal Grandmother    ??? No Known Problems Paternal Grandfather    ??? No Known Problems Other    ??? Amblyopia Neg Hx    ??? Blindness Neg Hx    ??? Cancer Neg Hx    ??? Cataracts Neg Hx    ??? Diabetes Neg Hx    ??? Glaucoma Neg Hx    ??? Hypertension Neg Hx    ??? Macular degeneration Neg Hx    ??? Retinal detachment Neg Hx    ??? Strabismus Neg Hx    ??? Stroke Neg Hx    ??? Thyroid disease Neg Hx        Social History  Social History     Tobacco Use   ??? Smoking status: Current Every Day Smoker     Packs/day: 0.40     Years: 10.00     Pack years: 4.00     Types: Cigarettes   ??? Smokeless tobacco: Never Used   Substance Use Topics   ??? Alcohol use: Yes   ??? Drug use: Never     Review of Systems     Pertinent items are noted in HPI.     Assessment and Plan     Darrell Foglio is a 34 y.o. male admitted to NSICU with a diagnosis of redo right temporal crani for resection of GBM and cyst fenestration.     Neuro:  Daily interruption of sedation: N/A  Multi-Modality Monitoring: none  Current anticonvulsants: Keppra.  Mobilty protocol current stage: 2  Splints/PRAFO:  No  Restraints:  Not indicated  CAM-ICU:     RASS:        **Brain tumor, s/p redo right temporal crani for resection and cyst fenestration POD #0  - follow up pathology  - goal SBP < 180 to prevent post-op hemorrhage  - post-op imaging with an MRI in the morning  - continue home dose of keppra 1000mg  BID  - start decadron at 4mg  BID for post-op cerebral edema, taper per SRN  - obtain PT/OT consults  - patient remains at risk for neurological decline due to post-op hemorrhage, cerebral edema, and seizures and requires close monitoring in the NSICU    **Pain  - tylenol prn mild pain  - oxycodone prn moderate pain  - morphine prn severe pain      Recent Labs   Lab Units 06/22/20  2209 06/22/20  1731  SODIUM WHOLE BLOOD mmol/L  --  140   SODIUM mmol/L 135  --        Cardiovascular:  Admission weight: 96.1  Daily        Cardiac enzymes: No results in the last day      **Hemodynamic goals  - Current vasoactive medications:  None  - MAP > 65  - goal SBP < 180 to reduce risk of post-op hemorrhage  - labetolol/hydralazine prn    **Hypertension  - previously prescribed propranolol 20mg  QID; not currently taking    Heme  Current DVT prophylaxis: SCDs, No chemical DVT prophylaxis due to bleeding risk    - check post-op CBC  - hold chemical DVT prophylaxis until POD#2 due to high risk of hemorrhage     Recent Labs   Lab Units 06/22/20  2209 06/19/20  1626   HEMOGLOBIN g/dL 44.0 10.2   PLATELET COUNT (1) 10*9/L 319 431     Lab Results   Component Value Date    INR 0.94 06/19/2020    APTT 24.1 (L) 06/19/2020     Pulmonary  The HOB is elevated to greater than 30 degrees.  O2/Vent Settings:        - Stable on RA    GI      There is no height or weight on file to calculate BMI.    GI prophylaxis: not indicated  BM:    Bowel regimen: start regimen when appropriate  Current nutritional source: Regular diet       **Nutrition  - bedside dysphagia screen   - advance diet as tolerated   - start oral diet    **Nausea  - zofran prn      Most recent LFTs:  Lab Results   Component Value Date    AST 20 06/19/2020    ALT 32 06/19/2020    ALKPHOS 38 06/19/2020    BILITOT 0.3 06/19/2020    PROT 6.8 06/19/2020    ALBUMIN 3.9 06/19/2020         Renal/Endocrine    Intake/Output Summary (Last 24 hours) at 06/22/2020 2337  Last data filed at 06/22/2020 2300  Gross per 24 hour   Intake 1356.25 ml   Output 1650 ml   Net -293.75 ml     Admission weight: 96.1  Daily     The patient current insulin regimen for blood glucose control:  SSI  Foley catheter: Foley required for accurate continuous I/O's.     **Glycemic control  - start FSBS and SSI to prevent hyperglycemia and to assess insulin needs     **Fluids  - goal euvolemia, NS at 75 cc/hour   - medlock IVF when adequate PO  - foley now for strict ins/outs, d/c foley on POD#1  - obtain chemistry and supplement electrolytes as needed      Recent Labs     06/22/20  2231   POCGLU 131     Recent Labs   Lab Units 06/22/20  2209 06/19/20  1626   CREATININE mg/dL 7.25* 3.66*     No results found for: A1C  Potassium   Date Value Ref Range Status   06/22/2020   Final     Comment:     Hemolyzed Specimen.     Magnesium   Date Value Ref Range Status   06/22/2020 2.1 1.6 - 2.2 mg/dL Final     No results found for: PHOS      ID  Temp (24hrs), Avg:36.3 ??C (  97.3 ??F), Min:36.3 ??C (97.3 ??F), Max:36.3 ??C (97.3 ??F)    Recent Labs   Lab Units 06/22/20  2209 06/19/20  1626   WBC 10*9/L 11.7* 13.0*     - culture if temp > 38.5  - post-op antibiotics x 24 hours    Current Access:         -  PIV x 2       - Arterial line: No arterial line present.       - Central venous line: No central line present.  Current antibiotics:  ??? ceFAZolin (ANCEF) IVPB 2 g in 50 ml dextrose (premix) Intravenous Q8H     Cultures:  No results found for: BLOOD CULTURE, URINE CULTURE, LOWER RESPIRATORY CULTURE  WBC (10*9/L)   Date Value   06/22/2020 11.7 (H)        Last CSF Analysis: No results found for: COLORCSF, APPEARCSF, NUCCELLSCSF, RBCCSF, NCSF, LYMPHSCSF, MONOMACCSF, EOSCSF, OTHERCSF, SPUNA, PROTCSF, GLUCCSF    Tubes and drains:  Patient Lines/Drains/Airways Status    Active Active Lines, Drains, & Airways     Name:   Placement date:   Placement time:   Site:   Days:    Urethral Catheter Temperature probe 16 Fr.   06/22/20    1415    Temperature probe   less than 1    Peripheral IV 06/22/20 Left Forearm   06/22/20    1349    Forearm   less than 1    Peripheral IV 06/22/20 Left Foot   06/22/20    1421    Foot   less than 1    Arterial Line 06/22/20 Right Radial   06/22/20    1445    Radial   less than 1                           Objective Data        Temp:  [36.3 ??C (97.3 ??F)] 36.3 ??C (97.3 ??F)  Heart Rate:  [94-105] 96  SpO2 Pulse:  [94-105] 95  Resp:  [9-20] 17  BP: (137-172)/(86-103) 172/103  MAP (mmHg):  [101-124] 124  SpO2:  [92 %-96 %] 96 %    Intake/Output Summary (Last 24 hours) at 06/22/2020 2337  Last data filed at 06/22/2020 2300  Gross per 24 hour Intake 1356.25 ml   Output 1650 ml   Net -293.75 ml     SpO2: 96 %          There is no height or weight on file to calculate BMI.         Hemodynamic/Invasive Device Data (24 hrs):       Ventilation/Oxygen Therapy (24hrs):           Medications:  Scheduled medications:   ??? acetaminophen  650 mg Oral Q4H While awake   ??? baclofen  20 mg Oral TID   ??? cefazolin  2 g Intravenous Q8H   ??? [START ON 06/23/2020] dexAMETHasone  4 mg Oral BID with meals   ??? famotidine  40 mg Oral BID   ??? insulin lispro  0-12 Units Subcutaneous ACHS   ??? levETIRAcetam  1,000 mg Oral BID   ??? ondansetron       ??? [START ON 06/23/2020] senna  2 tablet Oral Daily     Continuous infusions:   ??? sodium chloride 75 mL/hr (06/22/20 2300)     PRN medications:   ALPRAZolam, dextrose 50 % in water (  D50W), melatonin, MORPhine injection, ondansetron, oxyCODONE                   Physical Exam     All vital signs for the past 24 hours have been reviewed.  All laboratory studies resulted in the past 24 hours have been reviewed.     General Exam:  General: Awake and alert.   Lying in bed. Appears uncomfortable. No obvious distress.   ENT:  Mucous membranes moist. Oropharynx clear.  Cardiovascular:  Regular rate and rhythm.  No murmurs.   2+ radial pulses bilaterally.    Respiratory: Breathing is comfortable and unlabored.  Lungs are clear to ausculation bilaterally.    Gastrointestinal: Soft, nontender, nondistended.  Extremities: Warm and well-perfused. No cyanosis, clubbing, or edema.  Skin: No obvious rashes or ecchymoses.    Neurological Exam:  Mental Status  LOC: awake and alert  Orientation: person, place and time  Speech: normal  Language: fluent with intact naming, repetition, comprehension    Cranial Nerves  Pupils: PERRL  Corneals: present bilaterally  Gaze: normal  Face Sensory: intact and symmetric  Face Motor: normal and symmetric  Cough: strong  Tongue: midline    Motor:   RUE: commands, spontaneous and 5/5  LUE: commands, spontaneous and 5/5  RLE: commands, spontaneous and 5/5  LLE: commands, spontaneous and 5/5     Sensory:    Sensory is intact to light touch    Glasgow Coma Score  Motor: obeys commands = 6  Verbal: oriented = 5  Eyes: eyes open spontaneously = 4        DISPOSITION     The patient requires admission to the [facility] Unit for management of the above conditions.    Estimated Transfer Date:  TBD    BILLING     This is an initial evaluation of the patient.    Kerin Ransom  Neurocritical Care

## 2020-06-23 NOTE — Unmapped (Signed)
NEUROSURGERY DISCHARGE SUMMARY    Identifying Information:   Darrell [name]  12-15-1986  [id number]    Admit date: 06/22/2020    Discharge date: 06/23/2020     Discharge Service: Neurosurgery [facility])    Discharge Attending Physician: Corlis Leak, MD    Discharge to: Home    Discharge Diagnoses:  Principal Problem:    Glioblastoma (CMS-HCC)  Resolved Problems:    * No resolved hospital problems. [facility] Course:   Darrell Ryther is a 34 y.o. male with a history of glioblastoma who presented with recurrence. They were seen and consented for surgical intervention after discussing the risks, benefits, and alternatives in full detail.    The patient was taken to the OR on 06/22/20 for a Right temporal glioblastoma multiforme. He tolerated the procedure well, was extubated in the OR, and was taken to the NSICU for further neuromonitoring. He did well postoperatively. His diet was slowly advanced and at the time of discharge he was tolerating a regular diet. The patient was able to void spontaneously, have his pain controlled with P.O. pain medication, and returned to the preoperative ambulatory status. He was evaluated by Physical and Occupational Therapy and found to have no weekly needs at home.  He will be discharged home on POD1 in stable condition.     Follow-up:  1. 2 week postop Neurosurgery  Telemedicine clinic appointment for wound care with Dr. Laqueta Jean.     Procedures:   1. Recurrent right temporal glioblastoma multiforme   2. Right temporal cyst with brainstem compression    Pathology:   Final path pending    Discharge Day Services:   The patient was seen and evaluated by the Neurosurgery team on the day of discharge and deemed stable for discharge, including stable labs, vital signs, radiographic studies, and neurologic exam.  Discharge instructions were given and explained.  Questions were answered.    Physical Exam:  General: No acute distress  Cardiovascular: Hemodynamically stable  Pulmonary: Breathing is unlabored  Neurologic:   Eyes open spontaneously  Oriented to person place and time; easily following commands  Left homonymous hemianopsia, o/w VFF  PERRL, EOMI, FS  RUE 5/5  LUE 5/5  RLE 5/5   LLE 5/5   No pronator drift  ??  Incision c/d/i, absorbable sutures    _____________________________________________________________________________    Temp:  [36.3 ??C-37.2 ??C] 37 ??C  Heart Rate:  [73-110] 107  SpO2 Pulse:  [73-105] 104  Resp:  [9-20] 16  BP: (137-178)/(82-120) 145/104  MAP (mmHg):  [101-132] 116  SpO2:  [92 %-98 %] 96 %  BMI (Calculated):  [27.08] 27.08      Condition at Discharge:   Stable  _____________________________________________________________________________  Discharge Medications:     Your Medication List      STOP taking these medications    sulfamethoxazole-trimethoprim 800-160 mg per tablet  Commonly known as: BACTRIM DS     temozolomide 140 mg capsule  Commonly known as: TEMODAR     temozolomide 20 mg capsule  Commonly known as: TEMODAR        CHANGE how you take these medications    dexAMETHasone 1 MG tablet  Commonly known as: DECADRON  Take 4 tablets (4 mg total) by mouth 2 (two) times a day with meals for 2 days, THEN 4 tablets (4 mg total) daily for 2 days, THEN 2 tablets (2 mg total) daily for 2 days, THEN 1 tablet (1 mg total) daily for 2  days.  Start taking on: June 23, 2020  What changed:   ?? medication strength  ?? See the new instructions.     oxyCODONE 5 MG immediate release tablet  Commonly known as: ROXICODONE  Take 1 tablet (5 mg total) by mouth every four (4) hours as needed for pain for up to 5 days.  What changed:   ?? medication strength  ?? how much to take  ?? when to take this  ?? reasons to take this        CONTINUE taking these medications    acetaminophen 325 MG tablet  Commonly known as: TYLENOL  Take 650 mg by mouth daily as needed for pain.     ALPRAZolam 1 MG tablet  Commonly known as: XANAX  Take 1 mg by mouth two (2) times a day as needed.     baclofen 20 MG tablet Commonly known as: LIORESAL  Take 20 mg by mouth Three (3) times a day.     butalbital-acetaminophen-caffeine 50-325-40 mg per tablet  Commonly known as: ESGIC  Take 1 tablet by mouth every four (4) hours as needed for pain.     dronabinoL 5 MG capsule  Commonly known as: MARINOL  Take 1 capsule (5 mg total) by mouth Three (3) times a day with a meal.     ibuprofen 200 MG tablet  Commonly known as: ADVIL,MOTRIN  Take 600 mg by mouth daily as needed for pain.     levETIRAcetam 1000 MG tablet  Commonly known as: KEPPRA  Take 1 tablet (1,000 mg total) by mouth Two (2) times a day.     nortriptyline 25 MG capsule  Commonly known as: PAMELOR  1 tab PO at bedtime x 1 week, then increase to 2 tabs PO qhs     ondansetron 8 MG disintegrating tablet  Commonly known as: ZOFRAN-ODT  Dissolve 1 tablet on the tongue 30 minutes before chemo and every 8 hours as needed for nausea.     ondansetron 4 MG disintegrating tablet  Commonly known as: ZOFRAN-ODT  Take 2 tablets (8 mg total) by mouth every eight (8) hours as needed for nausea for up to 7 days.     propranoloL 20 MG tablet  Commonly known as: INDERAL  Take 20 mg by mouth Two (2) times a day.     sildenafiL 50 MG tablet  Commonly known as: VIAGRA  Take 1 tablet (50 mg total) by mouth daily as needed for erectile dysfunction.     traZODone 50 MG tablet  Commonly known as: DESYREL  Take 0.5 - 1 tablets (25 - 50 mg) nightly as needed for sleep.     VITAMIN D3 50 mcg (2,000 unit) Cap  Generic drug: cholecalciferol (vitamin D3-50 mcg (2,000 unit))  Take 50 mcg by mouth daily.          _____________________________________________________________________________  Pending Test Results (if blank, then none):  Pending Labs     Order Current Status    Surgical pathology exam In process          Most Recent Labs:  Microbiology Results (last day)     ** No results found for the last 24 hours. **          Lab Results   Component Value Date    WBC 11.7 (H) 06/22/2020    HGB 14.6 06/22/2020 HCT 44.2 06/22/2020    PLT 319 06/22/2020       Lab Results   Component Value Date  NA 135 06/22/2020    K 4.4 06/22/2020    CL 107 06/22/2020    CO2 23.0 06/22/2020    BUN 8 06/22/2020    CREATININE 0.60 (L) 06/22/2020    CALCIUM 8.7 06/22/2020    MG 2.1 06/22/2020    PHOS 3.7 06/22/2020       _____________________________________________________________________________  Discharge Instructions:   Activity Instructions     Discharge activity      Activity:   Your muscles may be stiff or sore. Walking and moving around will help with both pain and stiffness. You will likely experience more fatigue for several days after surgery due to the medication used to put you to sleep. Try to remain active and awake during the day so that you are tired at bedtime and can get a full night???s rest. Avoid strenuous exercise for the first 2 weeks. Walking is ok. Do not do any bending, twisting, lifting over 10 lbs until cleared by the Neurosurgeon. Avoid any activity that puts you at risk for blows to the head.          Diet Instructions     Discharge Nutrition Therapy      Discharge Nutrition Therapy: Regular    Please continue a regular diet with no restrictions.               Other Instructions     Discharge call MD      Things to look out for:  -Pain- please call the Neurosurgery resident on-call for any worsening pain unrelieved with prescribed medications.  -Fever- If you have a fever over 101.5 F, call the Neurosurgery resident on-call. A low grade fever is normal for some people after surgery.  -Redness, drainage, odor, bleeding- If your incision is red, draining, has a bad odor, or has increasing bleeding, contact the Neurosurgery office during business hours or the Neurosurgery resident on-call after hours. There may be some normal swelling and pink-coloring around the wound. There may also be some fluid under the skin. It can take several weeks to months to be reabsorbed.  -Muscle strength- If an arm or leg becomes weak, call the Neurosurgery resident on     ???Call immediately for the following:  -Bowel and Bladder- If you have new loss of bowel or bladder control, report straight to the emergency room.  -Mental status- If you or your family notice a change in your mental state from your normal, go straight to the emergency room. A change in mental status includes: increased confusion, trouble waking up, blurry/double vision, severe uncontrolled headaches, vomiting, loss of control of one side of your body, seizures, or other concerning issues.    Important Numbers:  -Neurosurgery resident on-call 24/7: Call [phone] for the [facility] hospital operator. Ask to speak with the ???Neurosurgery resident on-call.???  -Neurosurgery Clinic: [phone] M-F 8am-5pm  -Spine Center: [phone] M-F 8am-5pm  -Neurosurgery Office: [phone] M-F 8am-5pm         Things to look out for:  -Pain- please call the Neurosurgery resident on-call for any worsening pain unrelieved with prescribed medications.  -Fever- If you have a fever over 101.5 F, call the Neurosurgery resident on-call. A low grade fever is normal for some people after surgery.  -Redness, drainage, odor, bleeding- If your incision is red, draining, has a bad odor, or has increasing bleeding, contact the Neurosurgery office during business hours or the Neurosurgery resident on-call after hours. There may be some normal swelling and pink-coloring around  the wound. There may also be some fluid under the skin. It can take several weeks to months to be reabsorbed.  -Muscle strength- If an arm or leg becomes weak, call the Neurosurgery resident on     ???Call immediately for the following:  -Bowel and Bladder- If you have new loss of bowel or bladder control, report straight to the emergency room.  -Mental status- If you or your family notice a change in your mental state from your normal, go straight to the emergency room. A change in mental status includes: increased confusion, trouble waking up, blurry/double vision, severe uncontrolled headaches, vomiting, loss of control of one side of your body, seizures, or other concerning issues.    Important Numbers:  -Neurosurgery resident on-call 24/7: Call [phone] for the [facility] hospital operator. Ask to speak with the ???Neurosurgery resident on-call.???  -Neurosurgery Clinic: [phone] M-F 8am-5pm  -Spine Center: [phone] M-F 8am-5pm  -Neurosurgery Office: [phone] M-F 8am-5pm    Discharge dressing      Wound care:   Examine the surgical site at least twice daily. Please change the dressing when wet/dirty until the 3rd day after surgery, then keep the wound open to air. Do not use ointments, creams, gels, peroxide, or blow dryers on the wound. You may shower on the 4th day after surgery. Please shower daily starting on day 4 and use baby shampoo for head incisions to clean the wound. Do not scrub too vigorously. Do not submerge the wound under water (swimming or tub soaking) until 4-6 weeks after surgery. After showering, pat the wound dry and leave open to air. You have an appointment in approximately 2 weeks to have the stitches/staples removed. If you did not receive an appointment while in the hospital, the clinic will contact you will an appointment.         Wound care:   Examine the surgical site at least twice daily. Please change the dressing when wet/dirty until the 3rd day after surgery, then keep the wound open to air. Do not use ointments, creams, gels, peroxide, or blow dryers on the wound. You may shower on the 4th day after surgery. Please shower daily starting on day 4 and use baby shampoo for head incisions to clean the wound. Do not scrub too vigorously. Do not submerge the wound under water (swimming or tub soaking) until 4-6 weeks after surgery. After showering, pat the wound dry and leave open to air. You have an appointment in approximately 2 weeks to have the stitches/staples removed. If you did not receive an appointment while in the hospital, the clinic will contact you will an appointment.    Discharge instructions      You have an appointment in 2 weeks to follow up with Dr. Laqueta Jean for wound check, telemedicine visit. If you do not receive an appointment time within 1 week of discharge, please call the neurosurgery clinic at the number listed above.         You have an appointment in 2 weeks to follow up with Dr. Laqueta Jean for wound check, telemedicine visit. If you do not receive an appointment time within 1 week of discharge, please call the neurosurgery clinic at the number listed above.    Discharge instructions      Pain:   You have been prescribed a narcotic pain medication. Do not drive or make critical/important decisions while taking this medication. Narcotic medications may cause constipation. Ensure you have adequate (>25 grams/day) of fiber  in your diet and drink at least 64 oz. of water daily. You may also wish to take an over the counter stool softener once or twice daily as needed for constipation while taking narcotic pain medication.         Pain:   You have been prescribed a narcotic pain medication. Do not drive or make critical/important decisions while taking this medication. Narcotic medications may cause constipation. Ensure you have adequate (>25 grams/day) of fiber in your diet and drink at least 64 oz. of water daily. You may also wish to take an over the counter stool softener once or twice daily as needed for constipation while taking narcotic pain medication.          Follow Up instructions and Outpatient Referrals     Discharge instructions      You have an appointment in 2 weeks to follow up with Dr. Laqueta Jean for wound check, telemedicine visit. If you do not receive an appointment time within 1 week of discharge, please call the neurosurgery clinic at the number listed above.    Discharge instructions      Pain:   You have been prescribed a narcotic pain medication. Do not drive or make critical/important decisions while taking this medication. Narcotic medications may cause constipation. Ensure you have adequate (>25 grams/day) of fiber in your diet and drink at least 64 oz. of water daily. You may also wish to take an over the counter stool softener once or twice daily as needed for constipation while taking narcotic pain medication.          Appointments which have been scheduled for you    Jul 25, 2020  9:00 AM  NEW OPTOMETRY with Theodoro Kos, OD  [place] [facility]ALMOLOGY [address] [facility] REGION) [address]  [address]  [place] [address]  [phone]      Sep 17, 2020 11:00 AM  RETURN  NEURO-OPHTHAL with Suzzette Righter, MD PhD  [facility]MOLOGY [address] [facility] REGION) [address]  [address]  [place] [address]  [phone]      Sep 26, 2020 12:15 PM  (Arrive by 12:00 PM)  MRI BRAIN W WO CONTRAST    -UN with IC MRI RM 1  [facility]) [address]  [place] [address]  [phone]   On appt date:  Bring recent lab work  Bring documentation of any metal object implants  Take meds as usual  Check w/physician if diabetic  You will be asked to change into a gown for your safety    On appt date do not:  Consume anything 2 hrs  Wear metallic items including jewelry (we are not responsible for lost items)    Let us know if pt:  Claustrophobic  Metal object implant  Pregnant  Prescribed a sedative  On dialysis  Allergic to MRI dye/contrast  Kidney Failure    (Title:MRIWCNTRST)     Sep 26, 2020  2:00 PM  (Arrive by 1:30 PM)  FOLLOW UP 30 with Colette Buford Dresser, MD  [facility] RADIATION ONCOLOGY [facility] REGION) [address]  [place] [address]  [phone]

## 2020-06-23 NOTE — Unmapped (Signed)
OCCUPATIONAL THERAPY  Evaluation (06/23/20 0740)    Patient Name:  Darrell [name]       Medical Record Number: [id number]   Date of Birth: 1986-09-27  Sex: Male            Assessment: Darrell Calix is a 34 y.o. male with a GBM s/p redo R temporal crani for resection and cyst fenestration (06/22/2020). He presents to acute OT at baseline function. After review of the patient's occupational profile and history, assessment of occupational performance, clinical decision making, and development of POC, the patient presents as a moderate complexity case. Patient should continue to benefit from ongoing OT services while in-house, along c post-acute services at a 5x/L frequency to gain maximal functional independence.    Today's Interventions: eval    Activity Tolerance During Today's Session  Tolerated treatment well    Plan  Planned Frequency of Treatment:  D/C Services for: D/C Services    Post-Discharge Occupational Therapy Recommendations:  OT services not indicated   OT DME Recommendations: None    GOALS:   Patient and Family Goals: Return to PLOF    Prognosis:  Excellent  Positive Indicators:  CLOF  Barriers to Discharge: None    Subjective  Current Status Received sup in bed, left seated on EOB  Prior Functional Status (I)    Medical Tests / Procedures: s/p redo R temporal crani for resection and cyst fenestration (06/22/2020)  Services patient receives: PT, OT, SLP    Patient / Caregiver reports: I am OK    Past Medical History:   Diagnosis Date   ??? Migraine     Social History     Tobacco Use   ??? Smoking status: Current Every Day Smoker     Packs/day: 0.40     Years: 10.00     Pack years: 4.00     Types: Cigarettes   ??? Smokeless tobacco: Never Used   Substance Use Topics   ??? Alcohol use: Yes      No past surgical history on file. Family History   Problem Relation Age of Onset   ??? No Known Problems Mother    ??? No Known Problems Father    ??? No Known Problems Sister    ??? No Known Problems Brother    ??? No Known Problems Maternal Aunt    ??? No Known Problems Maternal Uncle    ??? No Known Problems Paternal Aunt    ??? No Known Problems Paternal Uncle    ??? No Known Problems Maternal Grandmother    ??? No Known Problems Maternal Grandfather    ??? No Known Problems Paternal Grandmother    ??? No Known Problems Paternal Grandfather    ??? No Known Problems Other    ??? Amblyopia Neg Hx    ??? Blindness Neg Hx    ??? Cancer Neg Hx    ??? Cataracts Neg Hx    ??? Diabetes Neg Hx    ??? Glaucoma Neg Hx    ??? Hypertension Neg Hx    ??? Macular degeneration Neg Hx    ??? Retinal detachment Neg Hx    ??? Strabismus Neg Hx    ??? Stroke Neg Hx    ??? Thyroid disease Neg Hx         Patient has no known allergies.     Objective Findings  Precautions / Restrictions  Falls precautions    Weight Bearing  Non-applicable    Required Braces or Orthoses  Non-applicable  Communication Preference  Verbal    Pain  8/10 headache, RN aware    Equipment / Environment  Foley, Telemetry, Vascular access (PIV, TLC, Port-a-cath, PICC), Caregiver wearing mask for full session    Living Situation  Living Environment: House  Lives With: Sibling(s) (patient lives with his brother and his brother's girlfriend; reports that someone is usually home during the day to assist as needed)  Home Living: Two level home, Able to Live on main level with bedroom/bathroom, Ramped entrance, Tub/shower unit, Standard height toilet  Equipment available at home: [employer], Bedside commode, Shower chair with back (patient reports he has DME at home but currently does not use them)     Cognition   Orientation Level:  Oriented x 4   Arousal/Alertness:  Appropriate responses to stimuli   Attention Span:  Appears intact   Memory:  Appears intact   Following Commands:  Follows all commands and directions without difficulty   Safety Judgment:  Good awareness of safety precautions   Awareness of Errors:  Good awareness of safety precautions   Problem Solving:  Able to problem solve independently   Comments:      Vision / Perception    Hearing: WFL   Vision: No visual deficits  Perception: WFL       Hand Function  Hand Dominance: RHD  WFL    Skin Inspection  Sx incision to R) head    ROM / Strength/Coordination  UE ROM/ Strength/ Coordination: WFL  LE ROM/ Strength/ Coordination: WFL    Sensation:  Denies parasthesias    Balance:  (I)    Functional Mobility  Transfer Assistance Needed: No  Bed Mobility Assistance Needed: No  Ambulation: (I)    ADLs  ADLs: Independent  IADLs: NT    Vitals / Orthostatics  At Rest: VSS  With Activity: VSS    Medical Staff Made Aware: RN    Occupational Therapy Session Duration  OT Individual [mins]: 10       I attest that I have reviewed the above information.  Signed: Gunnar Fusi, OT  Ceasar Mons 06/23/2020

## 2020-06-23 NOTE — Unmapped (Signed)
Please schedule this patient for an appointment as noted below.  Please contact the patient as needed/appropriate to alert them of this appointment.    Provider: Kinnie Scales, MD    Date or Time Frame: 2 week follow up for wound check    Please telemedicine video virtual visit    Reason for Visit: s/p craniotomy     Imaging Needed: none

## 2020-06-23 NOTE — Unmapped (Signed)
NEUROSURGERY OPERATIVE NOTE    Date of Surgery: 06/22/2020    Preoperative Diagnoses:   1. Recurrent right temporal glioblastoma multiforme   2.  Right temporal cyst with brainstem compression    Postoperative Diagnoses: Same    Procedure(s) Performed: Revision right temporal craniotomy for recurrent tumor resection and fenestration of temporal cyst    Surgeon(s) and Role:     * Corlis Leak, MD - Primary     * Naaman Plummer, MD - Resident - Assisting     * Bonne Dolores, MD - Resident - Assisting    Anesthesia: General  Anesthesiologist: Harriet Masson, MD; Francesca Jewett, MD  Anesthesia Provider: Valetta Mole; Milly Jakob, CRNA; Tamera Punt, MD    Specimens:   ID Type Source Tests Collected by Time Destination   1 : Right temporal tumor  Tissue Brain SURGICAL PATHOLOGY EXAM Corlis Leak, MD 06/22/2020 1713          Implants: 4 Leibinger plating system screws    Estimated Blood Loss: 50 mL    Drains: None    Complications: None apparent at the end of the procedure    Indications for Surgery: This is a 34 y.o. male with a history of a glioblastoma multiform a resected at [facility] in March of this year status post chemoradiation who saw in the ED recently due to enlargement of the right temporal cyst with headaches and progressive nausea and vomiting.  He also noticed issues with reading with words moving when he was trying to visualize them.  He also had a baseline left upper quadrant field cut in both eyes.  We discussed the options with the patient as well as with Maxine Glenn is neuro oncologist and we will jointly decided to offer repeat resection and fenestration of the cyst.  The patient elected to proceed.    Operative Findings: Large cyst with partial cyst wall resection around small anterior superior nodule.  The large cyst appeared to communicate with the trapped temporal horn.  We felt was a smaller cyst anteriorly and medially was actually the temporal horn which were trapped.  We visualized the choroid plexus and the ependyma.  Fairly pale lesion.  All tissue sent for permanent pathology.    Procedure Details: The patient was identified and transported to the operating room.  After entering the room and then again prior to incision, timeouts were performed. We verified patient identification, procedure, site and side of surgery as well as antibiotic dosing. The patient was then placed under general anesthesia. All lines were placed and secured by the anesthesiology service. The patient was placed in the supine position in a skull clamp and all pressure points padded.  Navigation was set up for brain mapping. A pedicled incision planned along the prior surgical site. Local anesthetic was infiltrated into the incisional area. Prior to incision intravenous cefazolin was administered as well as 6 mg of dexamethasone and 1.5 g of Keppra. The operative site was prepped and draped in sterile fashion.     Incision was made with a knife and brought down to the cranial surface using Bovie electrocautery.  There was no good paracranial layer for harvest.  The temporalis was split and reflected inferiorly with the scalp flap.  The prior herniotomy site was identified and screws affixing the flap to the skull were removed and discarded.  There were 4 total.  The dural tack up sutures were cut and the bone flap was dissected  around its periphery.  The superior aspect had undergone partial bony healing and required drilling of the trough.  After that the dura separated easily.  There was a small anterior inferior defect underneath the base of temporalis muscle near the infratemporal fossa.  The bony flap was removed and saved for reimplantation.    Hemostasis was achieved in the epidural space.  Navigation was used to plan the durotomy.  A small inferior durotomy was made and reflecting the flap inferiorly a small amount of brain tissue was exposed as well as the inferior temporal portion of the cyst which came to the arachnoid surface.      We then brought in the operating microscope.  Under the microscope we use a combination of bipolar and blunt dissection with suction to outline the cyst wall and carrying it anteriorly and superiorly towards the small tumor nodule that was located along the periphery of the cyst.  The cyst wall was dissected away and once we identified what appeared to be a pale rubbery lesion that was consistent within the location of the presumed tumor on the navigation we found a border deep to it and attempted perilesional resection.  On the cyst side the lesion appeared yellowish with a pasty appearance.  We were not too aggressive but did want to obtain adequate sample for pathology and potential molecular testing.  The anteriormost and medial portion of the cyst at the conjunction of the edge of the lesion a small area of arachnoid was seen overlying what appeared to be the second cyst cavity.  This was opened and it was then evident that the second cyst cavity was actually trapped temporal horn as the choroid plexus was seen along the ependymal surface.  A small piece of Gelfoam was placed at the edge of the temporal horn and the remainder of the lesion was resected.     We then turned our attention to a small area of tumor that was identified in the preoperative imaging in the posterior medial portion of the cyst cavity.  Again the surface of the cyst cavity had the appearance of yellowish in a pasty look that seemed to indicate the lesion.  This was also associated with the preidentified area on the navigational system.  This lesion was removed circumferentially until normal brain was identified.  It was a small lesion but we feel like we got most of it.  There were no other conspicuous lesions identifiable on the imaging or on the inspection of the inner surface of the large cyst cavity.    The microscope was removed from the field.  The wound was irrigated copiously with saline and hemostasis achieved with bipolar cautery.  Surgicel was placed within the resection cavity and along the exposed brain surface.  The dura was closed with Nurolon sutures utilizing a small piece of temporalis fascia.  Surgicel was placed over the suture line and the inner cavity was again filled with irrigation to ensure clear egress.  Fibrillar, Avitene, and Adheres green glue was applied to seal off the epidural space.  The bone flap was then secured with 4 Leibinger plating screws.  The gaps were filled with Gelfoam and the remaining Avitene.  The wound was then closed in layers using absorbable sutures.      All counts were reportedly correct at the end of the case. The patient was extubated and transported to ICU in stable condition.

## 2020-06-23 NOTE — Unmapped (Signed)
Neurocritical Care   Progress Note     CODE STATUS:  FULL    Date of service: 06/23/2020  Hospital Day:  LOS: 1 day           Assessment and Plan     Darrell [name] is a 34 y.o. male admitted to NSICU with a diagnosis of redo right temporal crani for resection of GBM and cyst fenestration.     24 hour events  - Admitted post op    Neuro:  Daily interruption of sedation: N/A  Multi-Modality Monitoring: none  Current anticonvulsants: Keppra.  Mobilty protocol current stage: 2  Splints/PRAFO:  No  Restraints:  Not indicated  CAM-ICU:  CAM-ICU Result: CAM-ICU Negative (06/22/20 2100)  RASS: [place] Agitation Assessment Scale (RASS) : awakens to voice (eye opening/contact) >10 sec (06/23/20 0400)      **Brain tumor, s/p redo right temporal crani for resection and cyst fenestration POD #1  - follow up pathology  - goal SBP < 180 to prevent post-op hemorrhage  - post-op imaging with an MRI in the morning  - continue home dose of keppra 1000mg  BID  - decadron at 4mg  BID, 2 day tapers  - obtain PT/OT consults    **Pain  - tylenol prn mild pain  - oxycodone prn moderate pain  - morphine prn severe pain   - Restart home trazadone 50mg  at bedtime  - Restart home nortriptyline 50mg  QHS      Recent Labs   Lab Units 06/22/20  2209 06/22/20  1731   SODIUM WHOLE BLOOD mmol/L  --  140   SODIUM mmol/L 135  --        Cardiovascular:  Admission weight: 96.1  Daily        Cardiac enzymes: No results in the last day      **Hemodynamic goals  - Current vasoactive medications:  None  - MAP > 65  - goal SBP < 180 to reduce risk of post-op hemorrhage  - labetolol/hydralazine prn    **Hypertension  - previously prescribed propranolol 20mg  QID; not currently taking    Heme  Current DVT prophylaxis: SCDs, No chemical DVT prophylaxis due to bleeding risk    - check post-op CBC  - hold chemical DVT prophylaxis until POD#2 due to high risk of hemorrhage     Recent Labs   Lab Units 06/22/20  2209 06/19/20  1626   HEMOGLOBIN g/dL 60.4 54.0 PLATELET COUNT (1) 10*9/L 319 431     Lab Results   Component Value Date    INR 0.96 06/22/2020    APTT 26.6 06/22/2020     Pulmonary  The HOB is elevated to greater than 30 degrees.  O2/Vent Settings:        - Stable on RA    GI      There is no height or weight on file to calculate BMI.    GI prophylaxis: not indicated  BM: Last BM Date:  (pta)  Bowel regimen: start regimen when appropriate  Current nutritional source: Regular diet         Most recent LFTs:  Lab Results   Component Value Date    AST 20 06/19/2020    ALT 32 06/19/2020    ALKPHOS 38 06/19/2020    BILITOT 0.3 06/19/2020    PROT 6.8 06/19/2020    ALBUMIN 3.9 06/19/2020         Renal/Endocrine    Intake/Output Summary (Last 24 hours) at 06/23/2020 9811  Last  data filed at 06/23/2020 0549  Gross per 24 hour   Intake 2576.25 ml   Output 2700 ml   Net -123.75 ml     Admission weight: 96.1  Daily     The patient current insulin regimen for blood glucose control:  SSI  Foley catheter: Foley to be removed today.      **Fluids  - goal euvolemia, medlock  - medlock IVF when adequate PO  - obtain chemistry and supplement electrolytes as needed      Recent Labs     06/22/20  2231   POCGLU 131     Recent Labs   Lab Units 06/22/20  2209 06/19/20  1626   CREATININE mg/dL 1.61* 0.96*     No results found for: A1C  Potassium   Date Value Ref Range Status   06/22/2020 4.4 3.5 - 5.0 mmol/L Final     Magnesium   Date Value Ref Range Status   06/22/2020 2.1 1.6 - 2.2 mg/dL Final     Phosphorus   Date Value Ref Range Status   06/22/2020 3.7 2.9 - 4.7 mg/dL Final         ID  Temp (24hrs), Avg:36.9 ??C (98.5 ??F), Min:36.3 ??C (97.3 ??F), Max:37.2 ??C (99 ??F)    Recent Labs   Lab Units 06/22/20  2209 06/19/20  1626   WBC 10*9/L 11.7* 13.0*     - culture if temp > 38.5  - post-op antibiotics x 24 hours    Current Access:         -  PIV x 2       - Arterial line: No arterial line present.       - Central venous line: No central line present.  Current antibiotics:  ??? ceFAZolin (ANCEF) IVPB 2 g in 50 ml dextrose (premix) Intravenous Q8H     Cultures:  No results found for: BLOOD CULTURE, URINE CULTURE, LOWER RESPIRATORY CULTURE  WBC (10*9/L)   Date Value   06/22/2020 11.7 (H)        Last CSF Analysis: No results found for: COLORCSF, APPEARCSF, NUCCELLSCSF, RBCCSF, NCSF, LYMPHSCSF, MONOMACCSF, EOSCSF, OTHERCSF, SPUNA, PROTCSF, GLUCCSF    Tubes and drains:  Patient Lines/Drains/Airways Status    Active Active Lines, Drains, & Airways     Name:   Placement date:   Placement time:   Site:   Days:    Urethral Catheter Temperature probe 16 Fr.   06/22/20    1415    Temperature probe   less than 1    Peripheral IV 06/22/20 Left Forearm   06/22/20    1349    Forearm   less than 1    Peripheral IV 06/22/20 Left Foot   06/22/20    1421    Foot   less than 1                           Objective Data        Temp:  [36.3 ??C (97.3 ??F)-37.2 ??C (99 ??F)] 36.9 ??C (98.5 ??F)  Heart Rate:  [73-105] 82  SpO2 Pulse:  [73-105] 82  Resp:  [9-20] 9  BP: (137-178)/(82-108) 171/101  MAP (mmHg):  [101-131] 117  SpO2:  [92 %-97 %] 96 %    Intake/Output Summary (Last 24 hours) at 06/23/2020 0614  Last data filed at 06/23/2020 0549  Gross per 24 hour   Intake 2576.25 ml   Output 2700 ml  Net -123.75 ml     SpO2: 96 %          There is no height or weight on file to calculate BMI.         Hemodynamic/Invasive Device Data (24 hrs):       Ventilation/Oxygen Therapy (24hrs):           Medications:  Scheduled medications:   ??? acetaminophen  650 mg Oral Q4H While awake   ??? baclofen  20 mg Oral TID   ??? cefazolin  2 g Intravenous Q8H   ??? dexAMETHasone  4 mg Oral BID with meals   ??? insulin lispro  0-12 Units Subcutaneous ACHS   ??? levETIRAcetam  1,000 mg Oral BID   ??? senna  2 tablet Oral Daily     Continuous infusions:   ??? sodium chloride Stopped (06/23/20 0543)     PRN medications:   ALPRAZolam, dextrose 50 % in water (D50W), melatonin, MORPhine injection, ondansetron, oxyCODONE                   Physical Exam     All vital signs for the past 24 hours have been reviewed.  All laboratory studies resulted in the past 24 hours have been reviewed.     General Exam:  General: Awake and alert.   Lying in bed. Appears uncomfortable. No obvious distress.   ENT:  Mucous membranes moist. Oropharynx clear.  Cardiovascular:  Regular rate and rhythm.  No murmurs.   2+ radial pulses bilaterally.    Respiratory: Breathing is comfortable and unlabored.  Lungs are clear to ausculation bilaterally.    Gastrointestinal: Soft, nontender, nondistended.  Extremities: Warm and well-perfused. No cyanosis, clubbing, or edema.  Skin: No obvious rashes or ecchymoses.    Neurological Exam:  Mental Status  LOC: awake and alert  Orientation: person, place and time  Speech: normal  Language: fluent with intact naming, repetition, comprehension    Cranial Nerves  Pupils: PERRL  Corneals: present bilaterally  Gaze: normal  Face Sensory: intact and symmetric  Face Motor: normal and symmetric  Cough: strong  Tongue: midline    Motor:   RUE: commands, spontaneous and 5/5  LUE: commands, spontaneous and 5/5  RLE: commands, spontaneous and 5/5  LLE: commands, spontaneous and 5/5     Sensory:    Sensory is intact to light touch    Glasgow Coma Score  Motor: obeys commands = 6  Verbal: oriented = 5  Eyes: eyes open spontaneously = 4        DISPOSITION      The patient is stable to transfer to an acute care bed.    Estimated Transfer Date:  6/26 possible DC today    BILLING     This is a subsequent evaluation of the patient.    Terri Skains  Neurocritical Care

## 2020-06-23 NOTE — Unmapped (Signed)
NEUROSURGERY PROGRESS NOTE    Brief History of Present Illness  Darrell [name] is a 34 y.o. male with a GBM s/p redo R temporal crani for resection and cyst fenestration (06/22/2020)    Subjective/Interval History  Post op check    Interval Imaging Reviewed  None    Neurological Assessment and Plan  **Glioblastoma (CMS-HCC)  - POD 0  - Fu path  - dex 4 bid  - keppra 1g bid (home)  - periop abx  - MRI when able  - PT/OT/ST      Anticoagulant/Antiplatelet needs: None    Disposition: ICU    This patient is co-managed with the [facility]. The management of this patient was discussed in detail with them and we agree with their overall assessment and plan.      ___________________________________________________________________    Neurological Exam  Eyes open spontaneously  Oriented to person place and time; easily following commands  Names/repeats  LUQ deficit in both eyes, o/w VFF, PERRL, EOMI, FS  RUE 5/5  LUE 5/5  RLE 5/5   LLE 5/5   No pronator drift        The patient's vitals, intake/output, labs, orders, and relevant imaging were reviewed for the last 24 hours.    Problem List  Principal Problem:    Glioblastoma (CMS-HCC)  Resolved Problems:    * No resolved hospital problems. *

## 2020-06-23 NOTE — Unmapped (Addendum)
Neuro: GCS 15. Full Strength in all extremities. Pupils brisk reactivity. Pt alert able to track. Pain management.     Cardio: SBP < 180. NSR. Afebrile    Resp: RA. No interventions needed.     GI: House Diet. Moderate appetite.     GU: Good UOP.     NS @ 75 ml/hr stopped at 0543. Oral intake total: approx 720 mL since admission. Will continue to monitor.     Pain: PRN meds has provided mild to moderate relief.     Problem: Adult Inpatient Plan of Care  Goal: Plan of Care Review  Outcome: Progressing  Goal: Patient-Specific Goal (Individualization)  Outcome: Progressing  Goal: Absence of Hospital-Acquired Illness or Injury  Outcome: Progressing  Goal: Optimal Comfort and Wellbeing  Outcome: Progressing  Goal: Readiness for Transition of Care  Outcome: Progressing     Problem: Wound  Goal: Optimal Wound Healing  Outcome: Progressing     Problem: Diabetes Comorbidity  Goal: Blood Glucose Level Within Desired Range  Outcome: Progressing     Problem: Skin Injury Risk Increased  Goal: Skin Health and Integrity  Outcome: Progressing     Problem: Pain Acute  Goal: Optimal Pain Control  Outcome: Progressing     Problem: Fall Injury Risk  Goal: Absence of Fall and Fall-Related Injury  Outcome: Progressing

## 2020-06-24 MED ORDER — OXYCODONE 5 MG TABLET
ORAL_TABLET | ORAL | 0 refills | 4.00000 days | Status: CP | PRN
Start: 2020-06-24 — End: ?

## 2020-06-24 MED ORDER — DEXAMETHASONE 1 MG TABLET
ORAL_TABLET | ORAL | 0 refills | 8 days | Status: CP
Start: 2020-06-24 — End: 2020-07-02

## 2020-06-24 NOTE — Unmapped (Signed)
Medication refill

## 2020-06-26 NOTE — Unmapped (Signed)
[facility]  Oncology Outpatient Social Work      Reason for Contact:  Psychosocial support    Oncology Treatment Update:  On 06/22/20 Pt underwent two surgical procedures to relieve headaches and N/V due to increasing pressure:  1.??Recurrent right temporal glioblastoma multiforme   2. Right temporal cyst with brainstem compression  He has follow-up scheduled with Dr. Theodoro Kalata on 07/09/20 to discuss whether additional chemo will be needed.    Psychosocial Update and Case Management:  Pt is saddened by recurrence and knowledge that he may have to restart chemo, but generally coping well and maintaining hope.  Provided active listening and support.  Reinforced his positive outlook and hope.    Pt requests additional gas cards for upcoming appts.  His brother continues to decline financial assistance with household bills.  [place] is interested in applying for [employer] through [employer], and this SW'er provided him with the website link to do so:   [email or web address]    Medical Case Management:  Pt is struggling with cost of Rx's, and asked whether we knew that his Medicaid is not covering his Marinol, which was $150 out of pocket, or his oxycodone.   This was surprising to hear, and this Sw'er sent a message to Purcell Mouton, RN Navigator and Dr. Theodoro Kalata to look into the cost of these Rx's. Maybe a pharmacist needs to look at it?     Follow-Up Plan:  Pt has this SW'ers contact information and will reach out for psychosocial assistance as needed.     Claris Gladden, OSW-C  Oncology Outpatient Social Worker  Phone: [phone]

## 2020-06-27 DIAGNOSIS — C719 Malignant neoplasm of brain, unspecified: Principal | ICD-10-CM

## 2020-06-27 NOTE — Unmapped (Signed)
Hi guys, we still need an order for this patient.    Brain MRI

## 2020-06-27 NOTE — Unmapped (Incomplete)
Called patient's [facility] to inquire about out of pocket co-pays for oxycodone and marinol despite patient having [place] Medicaid. [facility] believes that the oxycodone had a prior authorization that the patient didn't want to wait on, but is unable to check since the patient has already picked up his medications. The patient has no more refills and [facility] would need a prescription to run in order to check what the rejection is.

## 2020-07-04 DIAGNOSIS — C719 Malignant neoplasm of brain, unspecified: Principal | ICD-10-CM

## 2020-07-04 DIAGNOSIS — R630 Anorexia: Principal | ICD-10-CM

## 2020-07-04 MED ORDER — DRONABINOL 5 MG CAPSULE
ORAL_CAPSULE | Freq: Three times a day (TID) | ORAL | 0 refills | 30.00000 days | Status: CP
Start: 2020-07-04 — End: ?

## 2020-07-09 ENCOUNTER — Encounter: Admit: 2020-07-09 | Discharge: 2020-07-10 | Payer: PRIVATE HEALTH INSURANCE

## 2020-07-09 DIAGNOSIS — C719 Malignant neoplasm of brain, unspecified: Principal | ICD-10-CM

## 2020-07-09 DIAGNOSIS — R630 Anorexia: Principal | ICD-10-CM

## 2020-07-09 MED ADMIN — gadobenate dimeglumine (MULTIHANCE) 529 mg/mL (0.1mmol/0.2mL) solution 20 mL: 20 mL | INTRAVENOUS | @ 18:00:00 | Stop: 2020-07-09

## 2020-07-10 ENCOUNTER — Ambulatory Visit
Admit: 2020-07-10 | Discharge: 2020-07-11 | Payer: PRIVATE HEALTH INSURANCE | Attending: Student in an Organized Health Care Education/Training Program | Primary: Student in an Organized Health Care Education/Training Program

## 2020-07-10 DIAGNOSIS — G43009 Migraine without aura, not intractable, without status migrainosus: Principal | ICD-10-CM

## 2020-07-10 DIAGNOSIS — C719 Malignant neoplasm of brain, unspecified: Principal | ICD-10-CM

## 2020-07-10 LAB — CBC W/ AUTO DIFF
BASOPHILS ABSOLUTE COUNT: 0.1 10*9/L (ref 0.0–0.1)
BASOPHILS RELATIVE PERCENT: 0.6 %
EOSINOPHILS ABSOLUTE COUNT: 0.4 10*9/L (ref 0.0–0.4)
EOSINOPHILS RELATIVE PERCENT: 4 %
HEMATOCRIT: 45.9 % (ref 41.0–53.0)
HEMOGLOBIN: 16 g/dL (ref 13.5–17.5)
LARGE UNSTAINED CELLS: 1 % (ref 0–4)
LYMPHOCYTES ABSOLUTE COUNT: 1.6 10*9/L (ref 1.5–5.0)
LYMPHOCYTES RELATIVE PERCENT: 14.6 %
MEAN CORPUSCULAR HEMOGLOBIN CONC: 34.9 g/dL (ref 31.0–37.0)
MEAN CORPUSCULAR HEMOGLOBIN: 34.2 pg — ABNORMAL HIGH (ref 26.0–34.0)
MEAN CORPUSCULAR VOLUME: 98.1 fL (ref 80.0–100.0)
MONOCYTES ABSOLUTE COUNT: 0.5 10*9/L (ref 0.2–0.8)
MONOCYTES RELATIVE PERCENT: 4.7 %
NEUTROPHILS ABSOLUTE COUNT: 8.3 10*9/L — ABNORMAL HIGH (ref 2.0–7.5)
NEUTROPHILS RELATIVE PERCENT: 75 %
PLATELET COUNT: 287 10*9/L (ref 150–440)
RED BLOOD CELL COUNT: 4.68 10*12/L (ref 4.50–5.90)
RED CELL DISTRIBUTION WIDTH: 13 % (ref 12.0–15.0)
WBC ADJUSTED: 11 10*9/L (ref 4.5–11.0)

## 2020-07-10 LAB — COMPREHENSIVE METABOLIC PANEL
ALBUMIN: 3.5 g/dL (ref 3.4–5.0)
ALKALINE PHOSPHATASE: 46 U/L (ref 46–116)
ANION GAP: 5 mmol/L (ref 5–14)
AST (SGOT): 15 U/L (ref <=34)
BILIRUBIN TOTAL: 0.7 mg/dL (ref 0.3–1.2)
BLOOD UREA NITROGEN: 7 mg/dL — ABNORMAL LOW (ref 9–23)
BUN / CREAT RATIO: 9
CHLORIDE: 105 mmol/L (ref 98–107)
CO2: 28 mmol/L (ref 20.0–31.0)
CREATININE: 0.75 mg/dL
EGFR CKD-EPI AA MALE: >90 mL/min/{1.73_m2} (ref >=60)
EGFR CKD-EPI NON-AA MALE: >90 mL/min/{1.73_m2} (ref >=60)
GLUCOSE RANDOM: 83 mg/dL (ref 70–99)
POTASSIUM: 4.2 mmol/L (ref 3.4–4.5)
PROTEIN TOTAL: 6.9 g/dL (ref 5.7–8.2)
SODIUM: 138 mmol/L (ref 135–145)

## 2020-07-10 LAB — EGFR CKD-EPI AA MALE: Glomerular filtration rate/1.73 sq M.predicted.black:ArVRat:Pt:Ser/Plas/Bld:Qn:Creatinine-based formula (CKD-EPI): >90

## 2020-07-10 LAB — LARGE UNSTAINED CELLS: Lab: 1

## 2020-07-10 LAB — VITAMIN D, TOTAL (25OH): Lab: 27.7

## 2020-07-10 MED ORDER — ONDANSETRON 8 MG DISINTEGRATING TABLET
ORAL_TABLET | 10 refills | 0 days | Status: CP
Start: 2020-07-10 — End: ?
  Filled 2020-07-11: qty 60, 20d supply, fill #0

## 2020-07-10 MED ORDER — RIBOFLAVIN (VITAMIN B2) 100 MG TABLET
ORAL_TABLET | Freq: Every day | ORAL | 3 refills | 90 days | Status: CP
Start: 2020-07-10 — End: ?

## 2020-07-10 MED ORDER — MAGNESIUM OXIDE 400 MG (241.3 MG MAGNESIUM) TABLET
ORAL_TABLET | Freq: Every day | ORAL | 1 refills | 200 days | Status: CP
Start: 2020-07-10 — End: 2021-07-10

## 2020-07-10 MED ORDER — TEMOZOLOMIDE 180 MG CAPSULE
ORAL_CAPSULE | 0 refills | 0 days | Status: CP
Start: 2020-07-10 — End: ?
  Filled 2020-07-11: qty 10, 28d supply, fill #0

## 2020-07-10 NOTE — Unmapped (Signed)
Clinical Pharmacist Practitioner: Neuro Oncology Clinic    New Start Temozolomide Education    Darrell [name] is a 34 y.o. male with a right frontotemporal lobe glioblastoma who I am counseling today on initiation of oral chemotherapy.    Oral chemotherapy regimen: Temozolomide 150 mg/m2 days 1-5 every 28 days (360 mg)    Side effects discussed included but were not limited to: bone marrow suppression, nausea and vomiting, fatigue, and LFT abnormalities. Side effect prevention and management were also reviewed including laboratory monitoring of CBC and LFTs.    Administration: I reviewed the importance of taking the medication without food  (i.e. 1h before of 2h after a meal) with a full glass of water and at bedtime to reduce nausea. Oral chemotherapy handling precautions, pregnancy risk, as well as the importance of medication adherence were also discussed.    Drug Interactions: Instructed the patient that this medication has potential drug interactions. The patient should inform me of any new prescriptions so that I may evaluate for potential drug interactions.other medications reviewed and up to date in Epic.  No drug interactions identified.    Storage requirements: this medicine should be stored at room temperature.     Handling precautions reviewed:  Patient was counseled on the hazards surrounding oral chemotherapy and will minimize contact/exposure to the medicine.    Patient verbalized understanding of the above information as well as how to contact the Team with any questions/concerns.    Time with patient: 20 min    Laverna Peace PharmD, BCOP, CPP  Hematology/Oncology Pharmacist  P: [phone]

## 2020-07-10 NOTE — Unmapped (Signed)
[facility] [employer] [facility]  [facility] Neuro-Oncology Program    Return Patient Consultation    Demographics:  Patient Name: Darrell [name]  MRN: [id number]  DOB: 1986/02/12  Age: 34 y.o.    Identifying Statement:  Darrell [name] is a 34 y.o. 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    Assessment and Recommendations:  Darrell [name] is a pleasant 34 y.o. 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 The patient initially came to medical attention presenting to the [facility] ED on 03/15/20 with a 3 week history of intractable headaches with associated photophobia, blurry and double vision, and nausea/vomiting. CT Head in the ED revealed a new large ill-defined 6.8 cm hemorrhagic masslike lesion in the right parietotemporal region with prominent surrounding vasogenic edema and mass effect with 1.0 cm leftward midline shift. He was offered direct admission to the [facility] Neuro ICU at that time however patient decided to go home to be with family before undergoing further workup. He subsequently went to [facility] the following day 03/16/20 where he recieved IV dexamethasone and had brain-lab guided MRI that confirmed medial right temporal lesion measuring 5.6 x 4.8 x 3.9 cmHGG with satellite lesions as well as a trapped R temporal horn. He is s/p brain-lab guided right temporal craniotomy for gross total resection of the tumor on 03/21/20 by Dr. Adriana Simas at [facility]. 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 Patient completed 6000cGy RT with concurrent temozolomide locally with Dr. Carles Collet from 04/30/20 - 06/12/20. A larger cyst was identified at our visit on 06/19/20 and he returned on 06/22/20 for a scheduled surgery with Dr. Laqueta Jean, undergoing a craniotomy with cyst drainage and resection of tumor of the border of the cyst. He was discharged on 06/23/20.    KPS today is 90. Most recent MRI on 07/09/20 was compared to MRI dated 06/19/20 and demonstrates post operative changes with some progression of disease involving the medial aspect of the cavity. Labs from 06/22/20 were reviewed and were largely within appropriate limits. Physical exam is reassuring, patient's craniotomy is clean and dry, without any redness, drainage or other signs of infection . Patient has a mild resting tremor, which I suspect may be from ongoing steroid use.     We reviewed his most recent MRI, which shows post-operative with residual cystic components with increase areas of enhancing disease medial to the resection cavity. Clinically the patient reports some ongoing fatigue but is otherwise well appearing. We discussed proceeding with adjuvant maintenance temozolomide 150 mg/m2 D1-5/28D for cycle 1 (Stupp et al. Kavin Leech. 2005). We discussed most common hematologic, gastrointestinal, hepatic, and physical side effects of therapy, as well as means to mitigate some of these side effects. The patient was reminded to take chemotherapy at night in order to sleep off the side effects and to take scheduled anti-emetics 30 minutes prior to chemotherapy as directed. Baseline labs will be obtained today.  If the patient tolerates C1, we will consider a dose increase to temozolomide 200 mg/m2 and continue until intolerance/disease progression for a total of 12 cycles of chemotherapy. Labs will be checked around D26 or D27 of each cycle to confirm tolerance. We also discussed the addition of bevacizumab, but will defer the addition until tolerance of temozolomide is confirmed. MRIs will be obtained every 2 cycles or as circumstances dictate. Rationale for serial imaging was discussed.  Additionally we discussed the use of Optune as an adjunct to temozolomide in the maintenance setting (Stupp et al. JAMA Oncol 2015). The scientific rationale, potential therapeutic benefits, and common dermatologic toxicities were discussed as well. The patient understands that compliance of >75% (18 hours/day or greater) is minimal in order to gain the maximum treatment benefit. Patient will begin Optune on it's arrival.    We discussed the potential therapeutic role of Vitamin D and its addition to the patient's current treatment regimen Scarlette Calico, et al IJROBP 2017). Most recent level from 06/12/20 was 28.0. Patient is currently taking vitamin D 2000 units daily. I will target levels between 50 and 80, while watching blood Ca levels. Baseline labs will be obtained today and supplementation will be adjusted accordingly.    Patient continues on nortriptyline for headaches. His headaches have persisted, and I have prescribed magnesium and riboflavin for his headaches.  We will continue to monitor and gave precautions for his headaches.     Patient will follow up in 1 month with labs to confirm tolerance of temozolomide. Patient states his understanding and agreement with the plan.       Remainder of plan is outlined below.    Recommendations and Plan:  Right Frontotemporal Glioblastoma (WHO Grade IV)  - s/p gross total resection on 03/21/20 by Dr. Adriana Simas at [facility]  - s/p resection and cyst drainage on 06/22/20 with Dr. Laqueta Jean  - s/p 6000 cGy with Dr. Flonnie Hailstone with concurrent TMZ  - MRI today w/ post operative changes and PD  - will send TEMPUS sequencing - reviewed  - baseline labs to be obtained today  - proceed with initiation C1 of adjuvant temozolomide 150 mg/m2 PO D1-5/28D; if tolerating will increase to 200 mg/m2 and continue for a total of 12 cycles or until disease progression/intolerance  - labs around D26 of D27 of each cycle of TMZ to confirm tolerance  - consider addition of Optune device - pursuing approval  - currently on dexamethasone 1 mg BID - following taper per surgery  - most recent Vitamin D on 06/12/20 was 28.0 - patient taking 2000 units daily - labs will be drawn today and supplementation will be adjusted accordingly    Tumor-Related Headaches  - daily dull headaches with associated nausea, and dizziness.  - con't Tylenol PRN as needed  - con't nortriptyline at bedtime  - begin magnesium 400 mg/d and riboflavin 100 mg/d    Supportive Management:  - ondansetron 8 mg PO 30 minutes prior to chemotherapy and 8 hours as needed for nausea/vomiting  - monitoring for cognitive decline  - monitor for seizures - none thus far  - he will need to have charity care and PAP assistance set up  - con't marinol     Disposition:   - RTC in 1 months, post C1, with labs to confirm tolerance prior to appointment,   - RTC in 2 months with MRI prior to appointmnet    I have reviewed the laboratory, pathology, and radiology reports in detail and discussed findings with the patient and family members. The patient and family verbalized understanding of the discussion and plan; all posed questions were answered to their apparent satisfaction. The patient and family were advised to contact us with any questions or concerns that arise prior to their next appointment.    Maxine Glenn, MD  Director, [facility] Neuro-Oncology Program  Assistant Professor, [employer]  Division of Hematology and Medical Oncology    Oncology History  Overview Note   Identifying Statement:  Darrell [name] is a 34 y.o. 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 Initially came to medical attention presenting to [facility] ED with a 3 week history of intractable headaches with associated photophobia, blurry vision, and nausea/vomiting.     Molecular Markers:  Ki67: pending  MGMT promotor: unmethylated  IDH: wildtype  TERT promotor: mutated    TEMPUS  Potentially Actionable  EGFRvIII    Biologically Relevant  TERT        c.-124C>T Variant - Promoter mutation          21.5% VAF  EGFR        p.R108K Missense variant (exon 3) ??? GOF    19.0% VAF  CDKN2A    copy number loss  CDKN2B     copy number loss  EGFR          copy number gain  MTAP copy number loss  EGFR         EGFRvIVb    Variants of Unknown Significance  NTRK2      c.839A>G p.N280S Missense variant             58.7% VAF  CTNNA      c.591A>G p.E197E Splice region variant       42.1% VAF  PDK1         c.137T>C p.V46A Missense variant               29.4% VAF  EGFR        c.3483_3491del p.[id number]              12.3% VAF  SYNE1      c.852C>A p.D284E Missense variant             11.7% VAF  EGFR        c.2946+372_3133del Variant                          9.6% VAF    Treatment History:  03/15/20: presented to [facility] ED w/ a 3 week history of intractable HAs. CT Head revealed new large ill-defined 6.8 cm hemorrhagic masslike lesion in the right parietotemporal region with prominent surrounding vasogenic edema and mass effect with 1.0 cm leftward midline shift.   03/16/20: brain-lab guided MRI a [facility] confirmed medial right temporal lesion measuring 5.6 x 4.8 x 3.9 cmHGG with satellite lesions as well as a trapped R temporal horn.   03/21/20: s/p brain-lab guided right temporal craniotomy for resection by Dr. Adriana Simas at [facility]. 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  04/17/20: initial eval w/ Velinda Wrobel for GBM; KPS 90; MRI w/ GTR; proceed w/ chemoRT; RTC 2-3 weeks post completion of chemoRT w/ MRI  04/30/20-06/12/20: concurrent chemoRT; 6000 cGy total  06/19/20: KPS 90; MRI w/ large cyst; referred to ED for surgical intervention  06/22/20: admission for resection and cyst drainage with Dr. Laqueta Jean  07/09/20: KPS 90; MRI w/ PD; begin main't TMZ, Optune approval pending; RTC in 1 month w/ lab work to confirm tolerance         Interval History:  Darrell [name] is a 34 y.o. 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 Darrell [name] has a history of HTN, anxiety, and tobacco use. Patient was last seen in clinic on 06/19/20, during this  visit a large cyst was noted on his MRI and he was referred to the ED for admission. He was assessed by neurosurgery and discharged from the ED. He returned on 06/22/20 for a scheduled surgery with Dr. Laqueta Jean and underwent a craniotomy with cyst drainage and resection of tumor of the border of the cyst. He was discharged on 06/23/20.     On our discussion today, patient reports some ongoing fatigue and headaches. He tolerated the resection well and his incision is healing well. Patient denies weakness, confusion, speech or vision changes.       Review of Systems:  As noted within the HPI; otherwise, negative on 12 system review.    Allergies:  No Known Allergies    Medications:  Current Outpatient Medications   Medication Sig Dispense Refill   ??? acetaminophen (TYLENOL) 325 MG tablet Take 650 mg by mouth daily as needed for pain.     ??? ALPRAZolam (XANAX) 1 MG tablet Take 1 mg by mouth two (2) times a day as needed.      ??? baclofen (LIORESAL) 20 MG tablet Take 20 mg by mouth Three (3) times a day.     ??? butalbital-acetaminophen-caffeine (ESGIC) 50-325-40 mg per tablet Take 1 tablet by mouth every four (4) hours as needed for pain. 120 tablet 3   ??? cholecalciferol, vitamin D3-50 mcg, 2,000 unit,, (VITAMIN D3) 50 mcg (2,000 unit) cap Take 50 mcg by mouth daily.     ??? dronabinoL (MARINOL) 5 MG capsule Take 1 capsule (5 mg total) by mouth Three (3) times a day with a meal. 90 capsule 0   ??? ibuprofen (ADVIL,MOTRIN) 200 MG tablet Take 600 mg by mouth daily as needed for pain.     ??? levETIRAcetam (KEPPRA) 1000 MG tablet Take 1 tablet (1,000 mg total) by mouth Two (2) times a day. 180 tablet 3   ??? nortriptyline (PAMELOR) 25 MG capsule 1 tab PO at bedtime x 1 week, then increase to 2 tabs PO qhs 60 capsule 5   ??? ondansetron (ZOFRAN-ODT) 8 MG disintegrating tablet Dissolve 1 tablet on the tongue 30 minutes before chemo and every 8 hours as needed for nausea. 60 tablet 10   ??? oxyCODONE (ROXICODONE) 5 MG immediate release tablet Take 1 tablet (5 mg total) by mouth every four (4) hours as needed for pain. 20 tablet 0   ??? traZODone (DESYREL) 50 MG tablet Take 0.5 - 1 tablets (25 - 50 mg) nightly as needed for sleep. 30 tablet 1   ??? magnesium oxide (MAG-OX) 400 mg (241.3 mg elemental magnesium) tablet Take 1 tablet (400 mg total) by mouth daily. 200 tablet 1   ??? riboflavin, vitamin B2, 100 mg Tab Take 1 tablet (100 mg total) by mouth daily. 90 tablet 3     No current facility-administered medications for this visit.     Xanax 1 mg qHs for anxiety  propranalol for blood pressure  baclofen for irregular tachycardia / cardiomyopathy (managed by PCP, Dr. Barnett Hatter)  Not taking Prozac    Past Medical History:  Past Medical History:   Diagnosis Date   ??? Migraine        Past Surgical History:  Past Surgical History:   Procedure Laterality Date   ??? PR EXCIS SUPRATENT BRAIN TUMOR Right 06/22/2020    Procedure: CRANIECTOMY; EXC BRAIN TUMOR-SUPRATENTORIAL;  Surgeon: Corlis Leak, MD;  Location: MAIN OR [facility];  Service: Neurosurgery   ??? PR MICROSURG TECHNIQUES,REQ OPER MICROSCOPE Right 06/22/2020  Procedure: MICROSURGICAL TECHNIQUES, REQUIRING USE OF OPERATING MICROSCOPE (LIST SEPARATELY IN ADDITION TO CODE FOR PRIMARY PROCEDURE);  Surgeon: Corlis Leak, MD;  Location: MAIN OR [facility];  Service: Neurosurgery   ??? PR STEREOTACTIC COMP ASSIST PROC,CRANIAL,INTRADURAL N/A 06/22/2020    Procedure: STEREOTACTIC COMPUTER-ASSISTED (NAVIGATIONAL) PROCEDURE; CRANIAL, INTRADURAL;  Surgeon: Corlis Leak, MD;  Location: MAIN OR [facility];  Service: Neurosurgery       Social History:  Social History     Socioeconomic History   ??? Marital status: Single     Spouse name: Not on file   ??? Number of children: Not on file   ??? Years of education: Not on file   ??? Highest education level: Not on file   Occupational History   ??? Not on file   Tobacco Use   ??? Smoking status: Current Every Day Smoker     Packs/day: 0.40     Years: 10.00     Pack years: 4.00     Types: Cigarettes   ??? Smokeless tobacco: Never Used   Vaping Use   ??? Vaping Use: Every day   Substance and Sexual Activity   ??? Alcohol use: Yes   ??? Drug use: Never   ??? Sexual activity: Not on file   Other Topics Concern   ??? Not on file   Social History Narrative   ??? Not on file     Social Determinants of Health     Financial Resource Strain:    ??? Difficulty of Paying Living Expenses:    Food Insecurity:    ??? Worried About Programme researcher, broadcasting/film/video in the Last Year:    ??? Barista in the Last Year:    Transportation Needs:    ??? Freight forwarder (Medical):    ??? Lack of Transportation (Non-Medical):    Physical Activity:    ??? Days of Exercise per Week:    ??? Minutes of Exercise per Session:    Stress:    ??? Feeling of Stress :    Social Connections:    ??? Frequency of Communication with Friends and Family:    ??? Frequency of Social Gatherings with Friends and Family:    ??? Attends Religious Services:    ??? Database administrator or Organizations:    ??? Attends Engineer, structural:    ??? 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     Family History:  Family History   Problem Relation Age of Onset   ??? No Known Problems Mother    ??? No Known Problems Father    ??? No Known Problems Sister    ??? No Known Problems Brother    ??? No Known Problems Maternal Aunt    ??? No Known Problems Maternal Uncle    ??? No Known Problems Paternal Aunt    ??? No Known Problems Paternal Uncle    ??? No Known Problems Maternal Grandmother    ??? No Known Problems Maternal Grandfather    ??? No Known Problems Paternal Grandmother    ??? No Known Problems Paternal Grandfather    ??? No Known Problems Other    ??? Amblyopia Neg  Hx    ??? Blindness Neg Hx    ??? Cancer Neg Hx    ??? Cataracts Neg Hx    ??? Diabetes Neg Hx    ??? Glaucoma Neg Hx    ??? Hypertension Neg Hx    ??? Macular degeneration Neg Hx    ??? Retinal detachment Neg Hx    ??? Strabismus Neg Hx    ??? Stroke Neg Hx    ??? Thyroid disease Neg Hx         History of cancer in both maternal and paternal grandmothers (lung cancer and intra-abdominal cancer)  No family history of brain tumors.      Physical Examination:  Vitals:   Vitals:    07/10/20 0952   BP: 124/89   Pulse: (S) 122   Resp: 16   Temp: 36.5 ??C (97.7 ??F)   SpO2: 98%       General: Alert, calm, cooperative in no acute distress.  Head: Normocephalic, atraumatic. Well-approximated healing craniotomy incision without erythema, edema, dehiscence, or discharge.  EENT: No conjunctival injection or scleral icterus. Oral mucosa moist without lesions.  Lymphatic: no cervical or supraclavicular LAD  Lungs: Clear to auscultation bilaterally.  Cardiac: Regular rate and rhythm without murmurs, rubs, or gallops.  Abdomen: Active bowel sounds noted. Soft, nontender abdomen.  Skin: Texture, turgor, and pigmentation appear normal. No rashes, cyanosis, or petechiae.  Extremities: No clubbing, cyanosis, edema, or varicosities noted.    Neurological Examination:  Mental Status: Patient alert and oriented x 4 with affect appropriate to the situation.   Speech: Fluent and spontaneous speech. No expressive aphasia. No difficulty with repetition No verbal perseveration No paraphasic errors. No dysarthria. No receptive aphasia.    Cranial Nerves  Vision (II): Visual acuity is grossly without impairment. Visual fields are full.    EOMs (III,IV,VI): Gaze and tracking appear intact with smooth pursuits, no saccades. No ptosis.    Pupils (III): Pupils are equal, round, and reactive to light and accommodation.    Face (V, VII): Facial sensation is intact. Muscles of mastication full and symmetric.  Hearing (VIII): Hearing is grossly without impairment and intact to conversational speech.  Gag/Swallow (IX): Gag reflex testing deferred. Voice without hoarseness.    Palate (X): Elevates symmetrically.    Neck Strength (XI): Sternocleidomastoid and trapezius strength full and symmetric.  Tongue (XII): Midline without fasciculations.    Motor  Motor exam:  Mild resting tremor. normal muscle mass and tone in all extremities, and no pronator drift. Grossly 5/5.    Reflexes- not tested today    Sensory: Intact to light touch.  Coordination: grossly intact  Gait: Steady and normal gait;      Laboratory:  Lab Results   Component Value Date    WBC 11.0 07/10/2020    HGB 16.0 07/10/2020    HCT 45.9 07/10/2020    PLT 287 07/10/2020       Lab Results   Component Value Date    NA 135 06/22/2020    K 4.4 06/22/2020    CL 107 06/22/2020    CO2 23.0 06/22/2020    BUN 8 06/22/2020    CREATININE 0.60 (L) 06/22/2020    GLU 130 06/22/2020    CALCIUM 8.7 06/22/2020    MG 2.1 06/22/2020    PHOS 3.7 06/22/2020       Lab Results   Component Value Date    BILITOT 0.3 06/19/2020    BILIDIR 0.40 03/15/2020    PROT 6.8 06/19/2020  ALBUMIN 3.9 06/19/2020    ALT 32 06/19/2020    AST 20 06/19/2020    ALKPHOS 38 06/19/2020       Lab Results   Component Value Date    INR 0.96 06/22/2020    APTT 26.6 06/22/2020       Imaging:  MRI scans were personally reviewed.    Pathology:   Final Diagnosis   (Outside case: [phone], 12 slides, original collection date 03/21/2020)  ??  A: Brain, right frontotemporal lesion, resection  - Glioblastoma (WHO grade IV)  - See comment    We agree with original pathologist diagnosis.  Microscopic examination shows a densely cellular infiltrating diffuse glioma with mitotic figures, areas of tumor necrosis, and microvascular proliferation. Multiple calcospherites are present in the glioma. The glioma focally infiltrates overlying leptomeninges.  Immunohistochemical stains were performed at the originating institution and are reviewed at [facility].  Immunohistochemical stain for IDH1 R132H is negative in tumor cells. Immunohistochemical stain for ATRX  shows diffusely retained expression within the tumor cells.  ??  IDH1/2 and TERT promoter mutation studies are in process at [facility], and [facility] will issue results as an addendum to their Surgical Pathology report. We have requested unstained sections for MGMT status and for Ki-67 IHC.     Scribe's Attestation:   Documentation assistance was provided by me personally, Levy Pupa, a scribe. Maxine Glenn, MD obtained and performed the history, physical exam and medical decision making elements that were entered into the chart.   Signed by Levy Pupa, Scribe, on 07/10/20 at 12:14 PM.  ----------------------------------------------------------------------------------------------------------------------  July 10, 2020 1:04 PM. Documentation assistance provided by the Scribe. I was present during the time the encounter was recorded. The information recorded by the Scribe was done at my direction and has been reviewed and validated by me.  ----------------------------------------------------------------------------------------------------------------------       Maxine Glenn, MD  Director, [facility]-Oncology Program  Assistant Professor, [employer]  Division of Hematology and Medical Oncology

## 2020-07-10 NOTE — Unmapped (Addendum)
Thanks for speaking with me today - it was a pleasure to hear you're well.    Today we discussed the plan to begin maintenance chemotherapy with temozolomide. This is the same chemotherapy you were on during your radiation treatment, however the dose will be higher. This is taken for 5 days in a row followed by 23 days off (which makes a 28 day cycle). Our plan is to do a total of 12 cycles (or 1 total year of treatment). MRIs will be obtained every 2 cycles. You will need to obtain bloodwork around day 26 or day 27 of each cycle to confirm that your are tolerating chemotherapy. These can be done at any local [facility] facility near you. My nurse navigator Dewayne Hatch will reach out with instructions to coordinate this. Please keep track of when you start each cycle of chemotherapy on a calender so we can know when you'll need labwork done.     Please continue taking your anti-emetics 30-minute prior to taking chemotherapy and every 8 hours as needed for nausea/vomiting. You can continue to take your anti-emetics after you finish your 5 days of chemo if you still have nausea. We also recommend taking chemotherapy at night in order to sleep off the side effects.      We are currently pursuing approval for Optune. Someone from [employer] should reach out to you to schedule device education.     We will obtain baseline labs today. I will review the lab work and reach out if plans change.     Continue to take vitamin D. I may instruct you to double your dose if indicated by lab work later today.     Begin taking magnesium and riboflavin    We will see you back in clinic in 1 month with labs prior to appointment. Your next MRI will be in 2 months.          If you have any questions or concerns before coming back to [place], please call or send a message.    The best way to contact us is to establish your [facility] patient portal via [facility].     Communication to the patient portal comes directly to Dr. Theodoro Kalata and his team, and is attached to your medical record.  The communications are secure, and designed to protect the privacy of your information.  We will ask that you use this for communication of important medical information between you and your medical team.  Obviously if you have an urgent or emergent issue, it is important to call or contact us immediately rather than relying on patient portal communications.      The number to call for scheduling needs, non-urgent questions, or to route a call to the nurse line is [phone].   On Nights, Weekends and Holidays, call [phone] and ask for the oncologist on call.    For more information about a brain tumor/cancer diagnosis, treatment, and clinical trials, please visit [email or web address]    Maxine Glenn, MD

## 2020-07-11 MED FILL — TEMOZOLOMIDE 180 MG CAPSULE: 28 days supply | Qty: 10 | Fill #0 | Status: AC

## 2020-07-11 MED FILL — ONDANSETRON 8 MG DISINTEGRATING TABLET: 20 days supply | Qty: 60 | Fill #0 | Status: AC

## 2020-07-11 NOTE — Unmapped (Signed)
Patient called to discuss prognosis.    We went over glioblastomas.  There is no cure for glioblastomas currently.  We hope to prolong disease progression for as long as possible, but it is hard for me to give an approximate timeline.      I will be submitting Optune paperwork this week and he should be hearing from the [employer] within a few days.      [place] was happy to see our team yesterday in clinic.  He appreciated our discussion today and will be starting to plan for the future.

## 2020-07-11 NOTE — Unmapped (Signed)
[facility] Clinical Assessment & Refill Coordination Note    Darrell [name], : [birth year]  Phone: [phone] (home)     All above HIPAA information was verified with patient.     Was a Nurse, learning disability used for this call? No    Specialty Medication(s):   Hematology/Oncology: Temozolomide 180mg      Current Outpatient Medications   Medication Sig Dispense Refill   ??? acetaminophen (TYLENOL) 325 MG tablet Take 650 mg by mouth daily as needed for pain.     ??? ALPRAZolam (XANAX) 1 MG tablet Take 1 mg by mouth two (2) times a day as needed.      ??? baclofen (LIORESAL) 20 MG tablet Take 20 mg by mouth Three (3) times a day.     ??? butalbital-acetaminophen-caffeine (ESGIC) 50-325-40 mg per tablet Take 1 tablet by mouth every four (4) hours as needed for pain. 120 tablet 3   ??? cholecalciferol, vitamin D3-50 mcg, 2,000 unit,, (VITAMIN D3) 50 mcg (2,000 unit) cap Take 50 mcg by mouth daily.     ??? dronabinoL (MARINOL) 5 MG capsule Take 1 capsule (5 mg total) by mouth Three (3) times a day with a meal. 90 capsule 0   ??? ibuprofen (ADVIL,MOTRIN) 200 MG tablet Take 600 mg by mouth daily as needed for pain.     ??? levETIRAcetam (KEPPRA) 1000 MG tablet Take 1 tablet (1,000 mg total) by mouth Two (2) times a day. 180 tablet 3   ??? magnesium oxide (MAG-OX) 400 mg (241.3 mg elemental magnesium) tablet Take 1 tablet (400 mg total) by mouth daily. 200 tablet 1   ??? nortriptyline (PAMELOR) 25 MG capsule 1 tab PO at bedtime x 1 week, then increase to 2 tabs PO qhs 60 capsule 5   ??? ondansetron (ZOFRAN-ODT) 8 MG disintegrating tablet Dissolve 1 tablet on the tongue 30 minutes before chemo and every 8 hours as needed for nausea. 60 tablet 10   ??? riboflavin, vitamin B2, 100 mg Tab Take 1 tablet (100 mg total) by mouth daily. 90 tablet 3   ??? temozolomide (TEMODAR) 180 mg capsule Take 2 capsules (total dose 360 mg) by mouth at bedtime for 5 consecutive nights every 28 days.  Take Zofran 30 min before temozolomide dose. 10 capsule 0   ??? traZODone (DESYREL) 50 MG tablet Take 0.5 - 1 tablets (25 - 50 mg) nightly as needed for sleep. 30 tablet 1     No current facility-administered medications for this visit.        Changes to medications: Darrell reports no changes at this time.    No Known Allergies    Changes to allergies: No    SPECIALTY MEDICATION ADHERENCE     Temozolomide 180 mg: 0 days of medicine on hand     Medication Adherence    Patient reported X missed doses in the last month: 0  Specialty Medication: Temzolomide 180 mg capsules - 2 capsules (360 mg) at bedtime for 5 nights  Patient is on additional specialty medications: No  Informant: patient  Confirmed plan for next specialty medication refill: delivery by pharmacy          Specialty medication(s) dose(s) confirmed: Starting Adjuvant TMZ dosing at 360 mg at bedtime x 5 nights     Are there any concerns with adherence? No    Adherence counseling provided? Not needed    CLINICAL MANAGEMENT AND INTERVENTION      Clinical Benefit Assessment:    Do you feel  the medicine is effective or helping your condition? Yes    Clinical Benefit counseling provided? Not needed    Adverse Effects Assessment:    Are you experiencing any side effects? No    Are you experiencing difficulty administering your medicine? No    Quality of Life Assessment:    How many days over the past month did your glioblastoma  keep you from your normal activities? For example, brushing your teeth or getting up in the morning. 0    Have you discussed this with your provider? Not needed    Therapy Appropriateness:    Is therapy appropriate? Yes, therapy is appropriate and should be continued    DISEASE/MEDICATION-SPECIFIC INFORMATION      N/A    PATIENT SPECIFIC NEEDS     - Does the patient have any physical, cognitive, or cultural barriers? No    - Is the patient high risk? Yes, patient is taking oral chemotherapy. Appropriateness of therapy as been assessed.     - Does the patient require a Care Management Plan? No     - Does the patient require physician intervention or other additional services (i.e. nutrition, smoking cessation, social work)? No      SHIPPING     Specialty Medication(s) to be Shipped:   Hematology/Oncology: Temozolomide 180mg     Other medication(s) to be shipped: ondansetron     Changes to insurance: No    Delivery Scheduled: Yes, Expected medication delivery date: 07/12/20.     Medication will be delivered via UPS to the confirmed prescription address in [facility].    The patient will receive a drug information handout for each medication shipped and additional FDA Medication Guides as required.  Verified that patient has previously received a Conservation officer, historic buildings.    All of the patient's questions and concerns have been addressed.    Breck Coons Shared [facility] Specialty Pharmacist

## 2020-07-12 ENCOUNTER — Encounter: Admit: 2020-07-12 | Discharge: 2020-07-13 | Payer: PRIVATE HEALTH INSURANCE

## 2020-07-12 NOTE — Unmapped (Signed)
Neurosurgery Telemedicine Visit - Established Patient    - The patient confirmed his/her identity by stating their name and date of birth.  - I, the provider, conducted the video visit in a secure location at my office.   - This video visit was conducted using Doximity.      I spent 12 minutes on the real-time audio and video with the patient on the date of service. I spent an additional 8 minutes on pre- and post-visit activities on the date of service.     The patient was physically located in [place] or a state in which I am permitted to provide care. The patient and/or parent/guardian understood that s/he may incur co-pays and cost sharing, and agreed to the telemedicine visit. The visit was reasonable and appropriate under the circumstances given the patient's presentation at the time.    The patient and/or parent/guardian has been advised of the potential risks and limitations of this mode of treatment (including, but not limited to, the absence of in-person examination) and has agreed to be treated using telemedicine. The patient's/patient's family's questions regarding telemedicine have been answered.     If the visit was completed in an ambulatory setting, the patient and/or parent/guardian has also been advised to contact their provider???s office for worsening conditions, and seek emergency medical treatment and/or call 911 if the patient deems either necessary.      ____________________________________________________________________________________________________________________________    Assessment and Recommendations  Darrell [name] is a 34 y.o. 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 He is here today for his postoperative follow-up.     The patient's post-op MRI shows improvement in the compressive effect of the tumor cyst. There is some interval progression of his tumor.He is currently followed by Dr. Theodoro Kalata of Surgical Oncology and is beginning chemotherapy treatment today.      I instructed the patient to be aware of any new or worsening symptoms, such as an increase in severity of his headaches, and to contact me or my office to update [place]. If this were to occur, we would re-image as his cyst may become compressive again in the future.     As always, I encouraged the patient to reach out to my office with any questions or concerns at any point, should they arise. The patient understood and was in agreement with this plan. All questions and concerns were solicited and answered.     Interval history  Darrell [name] is a 34 y.o. male with history of glioblastoma s/p craniotomy with tumor resection and temporal cyst fenestration who is following up today regarding postoperative wound care.     The patient reports that since his inpatient care, his incision seems to be healing well and although he continues to have headaches, they are not as severe as they were before his surgery and are managed with Tylenol. He informs me that he is starting chemotherapy treatment today, which has a regimen of 5 days on followed by 23 days off, and he notes that this is to continue for a full year.       Imaging Reviewed  MRI Brain (07/09/2020)  Decompression of the right temporal cyst  Radiology notes: ??  - Area of increasing contrast enhancement along the medial aspect of the cyst, which appears to represent coalescence of several smaller foci of enhancement, concerning for progression of disease.      Review of Systems  A 10-system review of systems was conducted and was negative except as documented above in the HPI.    Physical Exam  Exam deferred since the visit was conducted virtually via video/telephone.    ---Historical data---    Allergies  Patient has no known allergies.    Medications    Medications reviewed in Epic.    Past Medical History  Past Medical History:   Diagnosis Date   ??? Migraine Past Surgical History  Past Surgical History:   Procedure Laterality Date   ??? PR EXCIS SUPRATENT BRAIN TUMOR Right 06/22/2020    Procedure: CRANIECTOMY; EXC BRAIN TUMOR-SUPRATENTORIAL;  Surgeon: Corlis Leak, MD;  Location: MAIN OR [facility];  Service: Neurosurgery   ??? PR MICROSURG TECHNIQUES,REQ OPER MICROSCOPE Right 06/22/2020    Procedure: MICROSURGICAL TECHNIQUES, REQUIRING USE OF OPERATING MICROSCOPE (LIST SEPARATELY IN ADDITION TO CODE FOR PRIMARY PROCEDURE);  Surgeon: Corlis Leak, MD;  Location: MAIN OR [facility];  Service: Neurosurgery   ??? PR STEREOTACTIC COMP ASSIST PROC,CRANIAL,INTRADURAL N/A 06/22/2020    Procedure: STEREOTACTIC COMPUTER-ASSISTED (NAVIGATIONAL) PROCEDURE; CRANIAL, INTRADURAL;  Surgeon: Corlis Leak, MD;  Location: MAIN OR [facility];  Service: Neurosurgery       Family History  Family History   Problem Relation Age of Onset   ??? No Known Problems Mother    ??? No Known Problems Father    ??? No Known Problems Sister    ??? No Known Problems Brother    ??? No Known Problems Maternal Aunt    ??? No Known Problems Maternal Uncle    ??? No Known Problems Paternal Aunt    ??? No Known Problems Paternal Uncle    ??? No Known Problems Maternal Grandmother    ??? No Known Problems Maternal Grandfather    ??? No Known Problems Paternal Grandmother    ??? No Known Problems Paternal Grandfather    ??? No Known Problems Other    ??? Amblyopia Neg Hx    ??? Blindness Neg Hx    ??? Cancer Neg Hx    ??? Cataracts Neg Hx    ??? Diabetes Neg Hx    ??? Glaucoma Neg Hx    ??? Hypertension Neg Hx    ??? Macular degeneration Neg Hx    ??? Retinal detachment Neg Hx    ??? Strabismus Neg Hx    ??? Stroke Neg Hx    ??? Thyroid disease Neg Hx        Social History  Social History     Tobacco Use   ??? Smoking status: Current Every Day Smoker     Packs/day: 0.40     Years: 10.00     Pack years: 4.00     Types: Cigarettes   ??? Smokeless tobacco: Never Used   Vaping Use   ??? Vaping Use: Every day   Substance Use Topics   ??? Alcohol use: Yes   ??? Drug use: Never Scribe's Attestation: Kinnie Scales, MD obtained and performed the history, physical exam and medical decision making elements that were  entered into the chart. Documentation assistance was provided by me personally, a scribe. Signed by Darnelle Maffucci, Scribe, on July 12, 2020 at 9:34 AM.       July 29, 2020 2:22 PM. Documentation assistance provided by the Scribe. I was present during the time the encounter was recorded. The information recorded by the Scribe was done at my direction and has been reviewed and validated by me.

## 2020-07-12 NOTE — Unmapped (Signed)
If you have any increase in your headaches or notice other symptoms, reach out to me or my office so that we can address any issue immediately.

## 2020-07-14 MED ORDER — TRAZODONE 50 MG TABLET
ORAL_TABLET | 1 refills | 0 days
Start: 2020-07-14 — End: ?

## 2020-07-14 NOTE — Unmapped (Signed)
I called the patient and discussed his most recent MRI.  We discussed he lost taste and smell, we discussed that not traditional medication side effect. He is covid neg.     We discussed headache management, which is improved from preop  I encouraged him to reach out with any questions or concerns

## 2020-07-16 MED ORDER — TRAZODONE 50 MG TABLET
ORAL_TABLET | 1 refills | 0 days | Status: CP
Start: 2020-07-16 — End: ?

## 2020-07-17 NOTE — Unmapped (Signed)
RADIATION ONCOLOGY TREATMENT COMPLETION NOTE    Encounter Date: 06/12/2020  Patient Name: Mali Shelley  Medical Record Number: [id number]    Referring Physician: No referring provider defined for this encounter.    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, TERT promotor mutated)    TREATMENT INTENT: curative    CLINICAL TRIAL: no    CHEMOTHERAPY: administered concurrently (TMZ)    RADIATION TREATMENT SUMMARY:          Treatment site Treatment Technique/Modality Energy Dose per fraction Total number  of fractions Total dose Start date End date   R frontoparietal IMRT  6 MV 200 cGy 23 4600 cGy 04/30/2020   06/01/2020   Tumor bed boost IMRT  6 MV 200 cGy 7 1400 cGy 06/04/2020 06/12/2020             COMPLETED INTENDED COURSE:  Yes    TREATMENT BREAK > 2 WEEKS:  No    TOLERANCE TO TREATMENT:  Moderate toxicities or complications requiring outpatient intervention(s)    FEEDING TUBE:  no    PLAN FOR FOLLOW-UP: Mali Narvaiz is to return for follow up with me/our group as needed- he will follow-up with medical oncology for further treatment    Othelia Pulling, MD  07/17/20 3:47 PM

## 2020-07-19 DIAGNOSIS — G43809 Other migraine, not intractable, without status migrainosus: Principal | ICD-10-CM

## 2020-07-19 MED ORDER — NORTRIPTYLINE 25 MG CAPSULE
ORAL_CAPSULE | 5 refills | 0 days | Status: CP
Start: 2020-07-19 — End: ?

## 2020-07-19 MED ORDER — SUMATRIPTAN 50 MG TABLET
ORAL_TABLET | Freq: Once | ORAL | 2 refills | 0.00000 days | Status: CP | PRN
Start: 2020-07-19 — End: 2021-07-20

## 2020-07-24 MED ORDER — PROCHLORPERAZINE MALEATE 10 MG TABLET
ORAL_TABLET | Freq: Three times a day (TID) | ORAL | 3 refills | 30 days | Status: CP
Start: 2020-07-24 — End: ?

## 2020-07-24 NOTE — Unmapped (Deleted)
Darrell [name] called to discuss some symptoms he is having start started about a week ago. He is a 34 yo with glioblastoma currently in cycle 1 of maintenance temozolomide. He started cycle 1 on 7/15, today is C1D13.     He reports he started having issues with smells making him nauseous including food (bacon,potato chips,soup) and soaps/cologne. He has not been able to eat much in the past 4-5 days, he has no appetite and has a lot of nausea associated with the smell of food. He has been using his marinol which previously was working well for his appetite but is not working right now. He is taking ondansetron about once per day as needed and has tried to take it before a meal but has not prevented his nausea associated with smells. He is taking alprazolam twice daily already which could help in the setting of anticipatory nausea.    Recommended prochlorperazine 10 mg 30 min prior to each meal. Continue marinol and ondansetron as needed. Will follow up in 2 weeks to assess appetite and nausea control.     Laverna Peace PharmD, BCOP, CPP  Hematology/Oncology Pharmacist  P: [phone]

## 2020-07-25 ENCOUNTER — Encounter: Admit: 2020-07-25 | Discharge: 2020-07-26 | Payer: PRIVATE HEALTH INSURANCE

## 2020-07-25 DIAGNOSIS — H5213 Myopia, bilateral: Principal | ICD-10-CM

## 2020-07-25 DIAGNOSIS — H52203 Unspecified astigmatism, bilateral: Principal | ICD-10-CM

## 2020-07-25 NOTE — Unmapped (Signed)
1) Myopia with astigmatism OD/OS, patient reports significant improvement in peripheral vision with CLs  -- patient apprised of findings  -- patient may fill spec Rx from Dr. Darel Hong  -- trial CLs dispensed  -- trial CLs ordered with updated Rx - pick up without an appointment  -- patient re-educated on proper lens care and hygiene  -- patient to follow-up via MyChart regarding vision with trials  -- keep appointments with Dr. Darel Hong, RTC with Dr. Sheppard Evens prn

## 2020-07-25 NOTE — Unmapped (Signed)
Mr. Sisler called to discuss some symptoms he is having start started about a week ago. He is a 34 yo with glioblastoma currently in cycle 1 of maintenance temozolomide. He started cycle 1 on 7/15, today is C1D13.     He reports he started having issues with smells making him nauseous including food (bacon,potato chips,soup) and soaps/cologne. He has not been able to eat much in the past 4-5 days, he has no appetite and has a lot of nausea associated with the smell of food. He has been using his marinol which previously was working well for his appetite but is not working right now. He is taking ondansetron about once per day as needed and has tried to take it before a meal but has not prevented his nausea associated with smells. He is taking alprazolam twice daily already which could help in the setting of anticipatory nausea.    Recommended prochlorperazine 10 mg 30 min prior to each meal. Continue marinol and ondansetron as needed. Will follow up in 2 weeks to assess appetite and nausea control.     Laverna Peace PharmD, BCOP, CPP  Hematology/Oncology Pharmacist  P: [phone]

## 2020-08-06 DIAGNOSIS — R238 Other skin changes: Principal | ICD-10-CM

## 2020-08-06 DIAGNOSIS — C719 Malignant neoplasm of brain, unspecified: Principal | ICD-10-CM

## 2020-08-06 MED ORDER — SKIN-PREP SPRAY-ON DRESSING
0 refills | 0 days
Start: 2020-08-06 — End: ?

## 2020-08-07 DIAGNOSIS — R238 Other skin changes: Principal | ICD-10-CM

## 2020-08-07 MED ORDER — SKIN-PREP SPRAY-ON DRESSING
0 refills | 0 days | Status: CP
Start: 2020-08-07 — End: ?

## 2020-08-07 NOTE — Unmapped (Signed)
[facility] Pharmacist Intervention    Type of intervention: Side effect, medication delivery adverse reaction to Optune device    Medication: Temozolomide, alprazolam, marinol, antiepileptics    Problem: Feeling very sleepy all the time, he has had aversions to food and doesn't feel like eating, family/friends feel he has lost weight, feels Marinol is not working. Optune device gave him a burning sensation on top of his head.    Intervention: Counseled that all the medications he is currently on can cause drowsiness.  It could be a cumulative effect.  He can try drinking a cup of coffee or a soda with caffeine.  Advised to avoid energy drinks and any herbal/vitamin supplements for energy.  Dr. Gerri Spore is aware of issue with Optune device.  He was advised to not use it for time being and a spray barrier was called in to [employer].    Follow up needed: not at this time.  He has follow up with [facility]. On 08/12    Approximate time spent: 20 minutes    Darrell [name] A Shari Heritage Shared [facility] Specialty Pharmacist

## 2020-08-09 ENCOUNTER — Ambulatory Visit
Admit: 2020-08-09 | Payer: PRIVATE HEALTH INSURANCE | Attending: Pharmacist Clinician (PhC)/ Clinical Pharmacy Specialist | Primary: Pharmacist Clinician (PhC)/ Clinical Pharmacy Specialist

## 2020-08-10 DIAGNOSIS — C719 Malignant neoplasm of brain, unspecified: Principal | ICD-10-CM

## 2020-08-10 MED ORDER — MIRTAZAPINE 7.5 MG TABLET
ORAL_TABLET | Freq: Every evening | ORAL | 3 refills | 30 days | Status: CP
Start: 2020-08-10 — End: 2020-09-09

## 2020-08-10 NOTE — Unmapped (Deleted)
Clinical Pharmacist Practitioner: Neuro-Oncology Clinic - Telephone Encounter    Patient Name: Darrell [name]  Patient Age: 34 y.o.    Assessment and recommendations:  Darrell [name] is a pleasant 34 y.o. 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 The patient initially came to medical attention presenting to the [facility] campus ED on 03/15/20 with a 3 week history of intractable headaches with associated photophobia, blurry and double vision, and nausea/vomiting. CT Head in the ED revealed a new large ill-defined 6.8 cm hemorrhagic masslike lesion in the right parietotemporal region with prominent surrounding vasogenic edema and mass effect with 1.0 cm leftward midline shift. He was offered direct admission to the [facility] Neuro ICU at that time however patient decided to go home to be with family before undergoing further workup. He subsequently went to [facility] the following day 03/16/20 where he recieved IV dexamethasone and had brain-lab guided MRI that confirmed medial right temporal lesion measuring 5.6 x 4.8 x 3.9 cmHGG with satellite lesions as well as a trapped R temporal horn. He is s/p brain-lab guided right temporal craniotomy for gross total resection of the tumor on 03/21/20 by Dr. Adriana Simas at [facility]. 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 Started chemorads on 5/3 and after day 2 had a seizure. MRI at [facility] showed 1. Interval resection of right temporal lobe mass with areas of suspected tumor. Compared to preoperative MRI there are areas of improvement and areas of progression.??Interval enlargement of the anterior right temporal horn of the right lateral ventricle which may represent some degree of entrapment. Interval decrease in abnormal T2 signal, mass effect, and midline shift within the right temporal lobe.    1. Right Frontotemporal Glioblastoma (WHO Grade IV)-   s/p cycle 1 temozolomide maintenance tolerated well with minimal side effects. Continues to have trouble with fatigue and appetite.   - CBC/diff and CMP on 8/17 and consideration for cycle 2  - ondansetron 8 mg 30 min prior to chemo and q8h as needed for nausea  - MGMT status and TEMPUS sequencing pending  ??  2. Tumor-Related Headaches  - daily dull headaches with associated nausea, and dizziness.  - con't Tylenol PRN as needed  - on nortriptyline 50 mg at bedtime and on propranolol 20 mg BID (for panic attacks)  - Has oxycodone 5 mg as needed for headaches, post discharge from [facility] ED  ??  3. Secondary Seizure disorder- has a seizure on 5/4 s/p day 2 of radiation. Went to [facility] ED, started on keppra. Not reported seizures since ER visit on 5/4. MRI findings above.   - On keppra 1000 mg BID   - Has follow up scheduled with [facility] neurosurgery on 05/09/20 for evaluation    4. Poor Appetite- reports a poor appetite which pre-dated starting his therapy but thinks it coincided with his diagnosis. Could be related to anxiety or treatment related.  - Continue marinol 5 mg BID 30 min prior to meals  - Start mirtazapine 7.5 mg qHS  - nutrition consult    5. Anxiety/panic attacks- currently well controlled per patient report with xanax which he takes 2-3 times per day as needed and propranolol  - Continue xanax and propranolol as needed for panic attacks.   - Would like [facility] referral    6. 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  This could be important to officially diagnose as patients with mutations of UGT1A1 can have life threating complications after receiving irinotecan and patients with homozygous UGT1A1*28 allele are at the greatest risk for complications.  - With irinotecan being a possible subsequent line therapy in GBM would recommend obtaining UGT1A1 DNA analysis     Follow- up: Dr. Theodoro Kalata 8/17, CPP s/p cycle 2  ______________________________________________________________________    Reason for visit:  Temozolomide monitoring and side effect management    Current Drug and Dose: Cycle 1 150 mg/m2    Date of Initiation: 07/12/20    Oral Agent Toxicities:  Fatigue, mild nausea, appetite    Interval History:  Darrell [name] main concerns are fatigue and appetite. He also reports mild nausea which he is treating with prochlorperazine with good effect. He says he is really eating minimal food right now and thinks he is losing weight. He is taking the marinol but seems like it has lost its effectiveness a bit. He also has a lot of fatigue, and is trying to stay active. He is interested in counseling, a lot of his friends go to therapy and are advocating for him to start as well, he would like to start seeing someone.     Adherence: Took appropriately no missed doses.    Drug Interactions: none    Oncology History Overview Note   Identifying Statement:  Darrell Schemm is a 34 y.o. 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 Initially came to medical attention presenting to [facility] campus ED with a 3 week history of intractable headaches with associated photophobia, blurry vision, and nausea/vomiting.     Molecular Markers:  Ki67: pending  MGMT promotor: unmethylated  IDH: wildtype  TERT promotor: mutated    TEMPUS  Potentially Actionable  EGFRvIII    Biologically Relevant  TERT        c.-124C>T Variant - Promoter mutation          21.5% VAF  EGFR        p.R108K Missense variant (exon 3) ??? GOF    19.0% VAF  CDKN2A    copy number loss  CDKN2B     copy number loss  EGFR          copy number gain  MTAP copy number loss  EGFR         EGFRvIVb    Variants of Unknown Significance  NTRK2      c.839A>G p.N280S Missense variant             58.7% VAF  CTNNA      c.591A>G p.E197E Splice region variant       42.1% VAF  PDK1         c.137T>C p.V46A Missense variant               29.4% VAF  EGFR c.3483_3491del p.[id number]              12.3% VAF  SYNE1      c.852C>A p.D284E Missense variant             11.7% VAF  EGFR        c.2946+[id number] Variant                          9.6% VAF    Treatment History:  03/15/20: presented to [facility] ED w/ a 3 week history of intractable HAs. CT Head revealed new large ill-defined 6.8 cm  hemorrhagic masslike lesion in the right parietotemporal region with prominent surrounding vasogenic edema and mass effect with 1.0 cm leftward midline shift.   03/16/20: brain-lab guided MRI a [facility] confirmed medial right temporal lesion measuring 5.6 x 4.8 x 3.9 cmHGG with satellite lesions as well as a trapped R temporal horn.   03/21/20: s/p brain-lab guided right temporal craniotomy for resection by Dr. Adriana Simas at [facility]. 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  04/17/20: initial eval w/ [facility] for GBM; KPS 90; MRI w/ GTR; proceed w/ chemoRT; RTC 2-3 weeks post completion of chemoRT w/ MRI  04/30/20-06/12/20: concurrent chemoRT; 6000 cGy total  06/19/20: KPS 90; MRI w/ large cyst; referred to ED for surgical intervention  06/22/20: admission for resection and cyst drainage with Dr. Laqueta Jean  07/09/20: KPS 90; MRI w/ PD; begin main't TMZ, Optune approval pending; RTC in 1 month w/ lab work to confirm tolerance         Vital Signs for this encounter:  There were no vitals taken for this visit.  Wt Readings from Last 3 Encounters:   07/10/20 94.2 kg (207 lb 11.2 oz)   06/23/20 95.7 kg (210 lb 15.7 oz)   06/19/20 96.1 kg (211 lb 12.8 oz)       Medications:  Current Outpatient Medications   Medication Sig Dispense Refill   ??? acetaminophen (TYLENOL) 325 MG tablet Take 650 mg by mouth daily as needed for pain.     ??? ALPRAZolam (XANAX) 1 MG tablet Take 1 mg by mouth two (2) times a day as needed.      ??? baclofen (LIORESAL) 20 MG tablet Take 20 mg by mouth Three (3) times a day.     ??? butalbital-acetaminophen-caffeine (ESGIC) 50-325-40 mg per tablet Take 1 tablet by mouth every four (4) hours as needed for pain. 120 tablet 3   ??? cholecalciferol, vitamin D3-50 mcg, 2,000 unit,, (VITAMIN D3) 50 mcg (2,000 unit) cap Take 50 mcg by mouth daily.     ??? dronabinoL (MARINOL) 5 MG capsule Take 1 capsule (5 mg total) by mouth Three (3) times a day with a meal. 90 capsule 0   ??? ibuprofen (ADVIL,MOTRIN) 200 MG tablet Take 600 mg by mouth daily as needed for pain.     ??? levETIRAcetam (KEPPRA) 1000 MG tablet Take 1 tablet (1,000 mg total) by mouth Two (2) times a day. 180 tablet 3   ??? magnesium oxide (MAG-OX) 400 mg (241.3 mg elemental magnesium) tablet Take 1 tablet (400 mg total) by mouth daily. 200 tablet 1   ??? nortriptyline (PAMELOR) 25 MG capsule 3 tab PO at bedtime 90 capsule 5   ??? ondansetron (ZOFRAN-ODT) 8 MG disintegrating tablet Dissolve 1 tablet on the tongue 30 minutes before chemo and every 8 hours as needed for nausea. 60 tablet 10   ??? ostomy supplies (SKIN-PREP SPRAY-ON DRESSING) SprA Apply to clean dry skin before wearing Optune device. 118 mL 0   ??? prochlorperazine (COMPAZINE) 10 MG tablet Take 1 tablet (10 mg total) by mouth Three (3) times a day before meals. 90 tablet 3   ??? riboflavin, vitamin B2, 100 mg Tab Take 1 tablet (100 mg total) by mouth daily. 90 tablet 3   ??? SUMAtriptan (IMITREX) 50 MG tablet Take 1 tablet (50 mg total) by mouth once as needed for migraine. Use only once daily as needed. 30 tablet 2   ??? temozolomide (TEMODAR) 180 mg capsule Take 2 capsules (total dose 360  mg) by mouth at bedtime for 5 consecutive nights every 28 days.  Take Zofran 30 min before temozolomide dose. 10 capsule 0   ??? traZODone (DESYREL) 50 MG tablet TAKE 1/2 TO 1 TABLET(25 TO 50 MG) BY MOUTH EVERY NIGHT AS NEEDED FOR SLEEP 30 tablet 1     No current facility-administered medications for this visit.       LABS:  Lab Results   Component Value Date    WBC 11.0 07/10/2020    HGB 16.0 07/10/2020    HCT 45.9 07/10/2020    PLT 287 07/10/2020       Lab Results Component Value Date    NA 138 07/10/2020    K 4.2 07/10/2020    CL 105 07/10/2020    CO2 28.0 07/10/2020    BUN 7 (L) 07/10/2020    CREATININE 0.75 07/10/2020    GLU 83 07/10/2020    CALCIUM 9.2 07/10/2020    MG 2.1 06/22/2020    PHOS 3.7 06/22/2020       Lab Results   Component Value Date    BILITOT 0.7 07/10/2020    BILIDIR 0.40 03/15/2020    PROT 6.9 07/10/2020    ALBUMIN 3.5 07/10/2020    ALT 20 07/10/2020    AST 15 07/10/2020    ALKPHOS 46 07/10/2020     I spent 20 minutes in direct patient care.    Laverna Peace PharmD, BCOP, CPP  Hematology/Oncology Pharmacist  P: [phone]

## 2020-08-10 NOTE — Unmapped (Signed)
Clinical Pharmacist Practitioner: Neuro-Oncology Clinic - Telephone Encounter    Patient Name: Darrell [name]  Patient Age: 34 y.o.    Assessment and recommendations:  Darrell [name] is a pleasant 34 y.o. 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 The patient initially came to medical attention presenting to the [facility] campus ED on 03/15/20 with a 3 week history of intractable headaches with associated photophobia, blurry and double vision, and nausea/vomiting. CT Head in the ED revealed a new large ill-defined 6.8 cm hemorrhagic masslike lesion in the right parietotemporal region with prominent surrounding vasogenic edema and mass effect with 1.0 cm leftward midline shift. He was offered direct admission to the [facility] Neuro ICU at that time however patient decided to go home to be with family before undergoing further workup. He subsequently went to [facility] the following day 03/16/20 where he recieved IV dexamethasone and had brain-lab guided MRI that confirmed medial right temporal lesion measuring 5.6 x 4.8 x 3.9 cmHGG with satellite lesions as well as a trapped R temporal horn. He is s/p brain-lab guided right temporal craniotomy for gross total resection of the tumor on 03/21/20 by Dr. Adriana Simas at [facility]. 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 Started chemorads on 5/3 and after day 2 had a seizure. MRI at [facility] showed 1. Interval resection of right temporal lobe mass with areas of suspected tumor. Compared to preoperative MRI there are areas of improvement and areas of progression.??Interval enlargement of the anterior right temporal horn of the right lateral ventricle which may represent some degree of entrapment. Interval decrease in abnormal T2 signal, mass effect, and midline shift within the right temporal lobe.    1. Right Frontotemporal Glioblastoma (WHO Grade IV)-   s/p cycle 1 temozolomide maintenance tolerated well with minimal side effects. Continues to have trouble with fatigue and appetite.   - CBC/diff and CMP on 8/17 and consideration for cycle 2  - ondansetron 8 mg 30 min prior to chemo and q8h as needed for nausea  - MGMT status and TEMPUS sequencing pending  ??  2. Tumor-Related Headaches  - daily dull headaches with associated nausea, and dizziness.  - con't Tylenol PRN as needed  - on nortriptyline 50 mg at bedtime and on propranolol 20 mg BID (for panic attacks)  - Has oxycodone 5 mg as needed for headaches, post discharge from [facility] ED  ??  3. Secondary Seizure disorder- has a seizure on 5/4 s/p day 2 of radiation. Went to [facility] ED, started on keppra. Not reported seizures since ER visit on 5/4. MRI findings above.   - On keppra 1000 mg BID   - Has follow up scheduled with [facility]surgery on 05/09/20 for evaluation    4. Poor Appetite- reports a poor appetite which pre-dated starting his therapy but thinks it coincided with his diagnosis. Could be related to anxiety or treatment related.  - Continue marinol 5 mg BID 30 min prior to meals  - Start mirtazapine 7.5 mg qHS  - nutrition consult    5. Anxiety/panic attacks- currently well controlled per patient report with xanax which he takes 2-3 times per day as needed and propranolol  - Continue xanax and propranolol as needed for panic attacks.   - Would like [facility] referral    6. 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  This could be important to officially diagnose as patients with mutations of UGT1A1 can have life threating complications after receiving irinotecan and patients with homozygous UGT1A1*28 allele are at the greatest risk for complications.  - With irinotecan being a possible subsequent line therapy in GBM would recommend obtaining UGT1A1 DNA analysis     Follow- up: Dr. Theodoro Kalata 8/17, CPP s/p cycle 2  ______________________________________________________________________    Reason for visit:  Temozolomide monitoring and side effect management    Current Drug and Dose: Cycle 1 150 mg/m2    Date of Initiation: 07/12/20    Oral Agent Toxicities:  Fatigue, mild nausea, appetite    Interval History:  Darrell [name] main concerns are fatigue and appetite. He also reports mild nausea which he is treating with prochlorperazine with good effect. He says he is really eating minimal food right now and thinks he is losing weight. He is taking the marinol but seems like it has lost its effectiveness a bit. He also has a lot of fatigue, and is trying to stay active. He is interested in counseling, a lot of his friends go to therapy and are advocating for him to start as well, he would like to start seeing someone.     Adherence: Took appropriately no missed doses.    Drug Interactions: none    Oncology History Overview Note   Identifying Statement:  Darrell [name] is a 34 y.o. 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 Initially came to medical attention presenting to [facility] campus ED with a 3 week history of intractable headaches with associated photophobia, blurry vision, and nausea/vomiting.     Molecular Markers:  Ki67: pending  MGMT promotor: unmethylated  IDH: wildtype  TERT promotor: mutated    TEMPUS  Potentially Actionable  EGFRvIII    Biologically Relevant  TERT        c.-124C>T Variant - Promoter mutation          21.5% VAF  EGFR        p.R108K Missense variant (exon 3) ??? GOF    19.0% VAF  CDKN2A    copy number loss  CDKN2B     copy number loss  EGFR          copy number gain  MTAP copy number loss  EGFR         EGFRvIVb    Variants of Unknown Significance  NTRK2      c.839A>G p.N280S Missense variant             58.7% VAF  CTNNA      c.591A>G p.E197E Splice region variant       42.1% VAF  PDK1         c.137T>C p.V46A Missense variant               29.4% VAF  EGFR c.3483_3491del p.[id number]              12.3% VAF  SYNE1      c.852C>A p.D284E Missense variant             11.7% VAF  EGFR        c.2946+372_[id number] Variant                          9.6% VAF    Treatment History:  03/15/20: presented to [facility] ED w/ a 3 week history of intractable HAs. CT Head revealed new large ill-defined 6.8 cm  hemorrhagic masslike lesion in the right parietotemporal region with prominent surrounding vasogenic edema and mass effect with 1.0 cm leftward midline shift.   03/16/20: brain-lab guided MRI a [facility] confirmed medial right temporal lesion measuring 5.6 x 4.8 x 3.9 cmHGG with satellite lesions as well as a trapped R temporal horn.   03/21/20: s/p brain-lab guided right temporal craniotomy for resection by Dr. Adriana Simas at [facility]. 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  04/17/20: initial eval w/ [facility] for GBM; KPS 90; MRI w/ GTR; proceed w/ chemoRT; RTC 2-3 weeks post completion of chemoRT w/ MRI  04/30/20-06/12/20: concurrent chemoRT; 6000 cGy total  06/19/20: KPS 90; MRI w/ large cyst; referred to ED for surgical intervention  06/22/20: admission for resection and cyst drainage with Dr. Laqueta Jean  07/09/20: KPS 90; MRI w/ PD; begin main't TMZ, Optune approval pending; RTC in 1 month w/ lab work to confirm tolerance         Vital Signs for this encounter:  @VS @  Wt Readings from Last 3 Encounters:   07/10/20 94.2 kg (207 lb 11.2 oz)   06/23/20 95.7 kg (210 lb 15.7 oz)   06/19/20 96.1 kg (211 lb 12.8 oz)       Medications:  Current Outpatient Medications   Medication Sig Dispense Refill   ??? acetaminophen (TYLENOL) 325 MG tablet Take 650 mg by mouth daily as needed for pain.     ??? ALPRAZolam (XANAX) 1 MG tablet Take 1 mg by mouth two (2) times a day as needed.      ??? baclofen (LIORESAL) 20 MG tablet Take 20 mg by mouth Three (3) times a day.     ??? butalbital-acetaminophen-caffeine (ESGIC) 50-325-40 mg per tablet Take 1 tablet by mouth every four (4) hours as needed for pain. 120 tablet 3   ??? cholecalciferol, vitamin D3-50 mcg, 2,000 unit,, (VITAMIN D3) 50 mcg (2,000 unit) cap Take 50 mcg by mouth daily.     ??? dronabinoL (MARINOL) 5 MG capsule Take 1 capsule (5 mg total) by mouth Three (3) times a day with a meal. 90 capsule 0   ??? ibuprofen (ADVIL,MOTRIN) 200 MG tablet Take 600 mg by mouth daily as needed for pain.     ??? levETIRAcetam (KEPPRA) 1000 MG tablet Take 1 tablet (1,000 mg total) by mouth Two (2) times a day. 180 tablet 3   ??? magnesium oxide (MAG-OX) 400 mg (241.3 mg elemental magnesium) tablet Take 1 tablet (400 mg total) by mouth daily. 200 tablet 1   ??? mirtazapine (REMERON) 7.5 MG tablet Take 1 tablet (7.5 mg total) by mouth nightly. 30 tablet 3   ??? nortriptyline (PAMELOR) 25 MG capsule 3 tab PO at bedtime 90 capsule 5   ??? ondansetron (ZOFRAN-ODT) 8 MG disintegrating tablet Dissolve 1 tablet on the tongue 30 minutes before chemo and every 8 hours as needed for nausea. 60 tablet 10   ??? ostomy supplies (SKIN-PREP SPRAY-ON DRESSING) SprA Apply to clean dry skin before wearing Optune device. 118 mL 0   ??? prochlorperazine (COMPAZINE) 10 MG tablet Take 1 tablet (10 mg total) by mouth Three (3) times a day before meals. 90 tablet 3   ??? riboflavin, vitamin B2, 100 mg Tab Take 1 tablet (100 mg total) by mouth daily. 90 tablet 3   ??? SUMAtriptan (IMITREX) 50 MG tablet Take 1 tablet (50 mg total) by mouth once as needed for migraine. Use only once daily as needed. 30 tablet 2   ???  temozolomide (TEMODAR) 180 mg capsule Take 2 capsules (total dose 360 mg) by mouth at bedtime for 5 consecutive nights every 28 days.  Take Zofran 30 min before temozolomide dose. 10 capsule 0   ??? traZODone (DESYREL) 50 MG tablet TAKE 1/2 TO 1 TABLET(25 TO 50 MG) BY MOUTH EVERY NIGHT AS NEEDED FOR SLEEP 30 tablet 1     No current facility-administered medications for this visit.       LABS:  Lab Results   Component Value Date    WBC 11.0 07/10/2020    HGB 16.0 07/10/2020    HCT 45.9 07/10/2020    PLT 287 07/10/2020       Lab Results   Component Value Date    NA 138 07/10/2020    K 4.2 07/10/2020    CL 105 07/10/2020    CO2 28.0 07/10/2020    BUN 7 (L) 07/10/2020    CREATININE 0.75 07/10/2020    GLU 83 07/10/2020    CALCIUM 9.2 07/10/2020    MG 2.1 06/22/2020    PHOS 3.7 06/22/2020       Lab Results   Component Value Date    BILITOT 0.7 07/10/2020    BILIDIR 0.40 03/15/2020    PROT 6.9 07/10/2020    ALBUMIN 3.5 07/10/2020    ALT 20 07/10/2020    AST 15 07/10/2020    ALKPHOS 46 07/10/2020       Lab Results   Component Value Date    INR 0.96 06/22/2020    APTT 26.6 06/22/2020     I spent 20 minutes in direct patient care.    Laverna Peace PharmD, BCOP, CPP  Hematology/Oncology Pharmacist  P: [phone]

## 2020-08-11 NOTE — Unmapped (Signed)
Informing pt of upcomng appt w/Katie  atf

## 2020-08-11 NOTE — Unmapped (Signed)
Informing pt about upcoming ph appt w/Katie      atf

## 2020-08-13 DIAGNOSIS — F419 Anxiety disorder, unspecified: Principal | ICD-10-CM

## 2020-08-13 DIAGNOSIS — C719 Malignant neoplasm of brain, unspecified: Principal | ICD-10-CM

## 2020-08-14 ENCOUNTER — Ambulatory Visit
Admit: 2020-08-14 | Discharge: 2020-08-14 | Payer: PRIVATE HEALTH INSURANCE | Attending: Psychiatry | Primary: Psychiatry

## 2020-08-14 ENCOUNTER — Ambulatory Visit
Admit: 2020-08-14 | Discharge: 2020-08-14 | Payer: PRIVATE HEALTH INSURANCE | Attending: Student in an Organized Health Care Education/Training Program | Primary: Student in an Organized Health Care Education/Training Program

## 2020-08-14 ENCOUNTER — Other Ambulatory Visit: Admit: 2020-08-14 | Discharge: 2020-08-14 | Payer: PRIVATE HEALTH INSURANCE

## 2020-08-14 DIAGNOSIS — C719 Malignant neoplasm of brain, unspecified: Principal | ICD-10-CM

## 2020-08-14 LAB — COMPREHENSIVE METABOLIC PANEL
ALBUMIN: 3.5 g/dL (ref 3.4–5.0)
ALKALINE PHOSPHATASE: 52 U/L (ref 46–116)
ALT (SGPT): 18 U/L (ref 10–49)
AST (SGOT): 14 U/L (ref <=34)
BILIRUBIN TOTAL: 1 mg/dL (ref 0.3–1.2)
BLOOD UREA NITROGEN: <5 mg/dL — ABNORMAL LOW (ref 9–23)
CALCIUM: 9.3 mg/dL (ref 8.7–10.4)
CO2: 28 mmol/L (ref 20.0–31.0)
CREATININE: 0.94 mg/dL
EGFR CKD-EPI AA MALE: >90 mL/min/{1.73_m2} (ref >=60)
EGFR CKD-EPI NON-AA MALE: >90 mL/min/{1.73_m2} (ref >=60)
GLUCOSE RANDOM: 86 mg/dL (ref 70–179)
POTASSIUM: 4.1 mmol/L (ref 3.4–4.5)
PROTEIN TOTAL: 6.4 g/dL (ref 5.7–8.2)
SODIUM: 138 mmol/L (ref 135–145)

## 2020-08-14 LAB — CBC W/ AUTO DIFF
BASOPHILS ABSOLUTE COUNT: 0.1 10*9/L (ref 0.0–0.1)
BASOPHILS RELATIVE PERCENT: 0.8 %
EOSINOPHILS ABSOLUTE COUNT: 0.4 10*9/L (ref 0.0–0.4)
EOSINOPHILS RELATIVE PERCENT: 5.3 %
HEMOGLOBIN: 16 g/dL (ref 13.5–17.5)
LARGE UNSTAINED CELLS: 2 % (ref 0–4)
LYMPHOCYTES ABSOLUTE COUNT: 1.1 10*9/L — ABNORMAL LOW (ref 1.5–5.0)
LYMPHOCYTES RELATIVE PERCENT: 12.9 %
MEAN CORPUSCULAR HEMOGLOBIN CONC: 34.5 g/dL (ref 31.0–37.0)
MEAN CORPUSCULAR HEMOGLOBIN: 33.5 pg (ref 26.0–34.0)
MEAN CORPUSCULAR VOLUME: 97.2 fL (ref 80.0–100.0)
MEAN PLATELET VOLUME: 9.3 fL (ref 7.0–10.0)
MONOCYTES ABSOLUTE COUNT: 0.4 10*9/L (ref 0.2–0.8)
MONOCYTES RELATIVE PERCENT: 5.2 %
PLATELET COUNT: 253 10*9/L (ref 150–440)
RED BLOOD CELL COUNT: 4.78 10*12/L (ref 4.50–5.90)
RED CELL DISTRIBUTION WIDTH: 12.4 % (ref 12.0–15.0)
WBC ADJUSTED: 8.3 10*9/L (ref 4.5–11.0)

## 2020-08-14 LAB — RED CELL DISTRIBUTION WIDTH: Lab: 12.4

## 2020-08-14 LAB — BLOOD UREA NITROGEN: Urea nitrogen:MCnc:Pt:Ser/Plas:Qn:: <5 — ABNORMAL LOW

## 2020-08-14 MED ORDER — DRONABINOL 5 MG CAPSULE
ORAL_CAPSULE | Freq: Two times a day (BID) | ORAL | 3 refills | 30.00000 days | Status: CP
Start: 2020-08-14 — End: ?

## 2020-08-14 MED ORDER — TEMOZOLOMIDE 250 MG CAPSULE
ORAL_CAPSULE | 1 refills | 0 days | Status: CP
Start: 2020-08-14 — End: ?

## 2020-08-14 MED ORDER — TEMOZOLOMIDE 180 MG CAPSULE
ORAL_CAPSULE | 1 refills | 0 days | Status: CP
Start: 2020-08-14 — End: ?
  Filled 2020-08-16: qty 5, 28d supply, fill #0

## 2020-08-14 MED ORDER — ONDANSETRON HCL 8 MG TABLET
ORAL_TABLET | 2 refills | 0 days | Status: CP
Start: 2020-08-14 — End: ?
  Filled 2020-08-16: qty 60, 20d supply, fill #1

## 2020-08-14 NOTE — Unmapped (Unsigned)
Labs drawn peripherally by  Waymon Budge Phlebotomist. ;

## 2020-08-14 NOTE — Unmapped (Signed)
[employer] [facility]  [facility] Neuro-Oncology Program    Return Patient Consultation    Demographics:  Patient Name: Darrell [name]  MRN: [id number]  DOB: Sep 05, 1986  Age: 34 y.o.    Identifying Statement:  Darrell Russ is a 34 y.o. 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    Assessment and Recommendations:  Darrell Cornfield is a pleasant 34 y.o. 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 The patient initially came to medical attention presenting to the [facility] ED on 03/15/20 with a 3 week history of intractable headaches with associated photophobia, blurry and double vision, and nausea/vomiting. CT Head in the ED revealed a new large ill-defined 6.8 cm hemorrhagic masslike lesion in the right parietotemporal region with prominent surrounding vasogenic edema and mass effect with 1.0 cm leftward midline shift. He was offered direct admission to the [facility] Neuro ICU at that time however patient decided to go home to be with family before undergoing further workup. He subsequently went to [facility] the following day 03/16/20 where he recieved IV dexamethasone and had brain-lab guided MRI that confirmed medial right temporal lesion measuring 5.6 x 4.8 x 3.9 cmHGG with satellite lesions as well as a trapped R temporal horn. He is s/p brain-lab guided right temporal craniotomy for gross total resection of the tumor on 03/21/20 by Dr. Adriana Simas at [facility]. 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 Patient completed 6000cGy RT with concurrent temozolomide locally with Dr. Carles Collet from 04/30/20 - 06/12/20. A larger cyst was identified at our visit on 06/19/20 and he returned on 06/22/20 for a scheduled surgery with Dr. Laqueta Jean, undergoing a craniotomy with cyst drainage and resection of tumor of the border of the cyst. He was discharged on 06/23/20. He began maintenance temozolomide on 07/10/20    KPS today is 90. There were no new MRI to review. Most recent MRI on 07/09/20 was compared to MRI dated 06/19/20 and demonstrates post operative changes with some progression of disease involving the medial aspect of the cavity. Labs from earlier today are notable for ALC of 1100, all other results are largely reassuring. Physical exam is reassuring. No deficits were appreciated on neurological exam.     There were no recent MRI to review. Patient began maintenance temozolomide on on the 13th of July. He has tolerated the therapy well with only mild expected side effects. He continues to have headaches some intermittent headaches and has a history of migraines prior to his diagnosis. These headaches are very manageable and not the worst he has had. He also reports some issues with nausea/appetite. He has used Zofran to little relief, pharmacy has sent a prescription for Compazine. We will increase his Marinol to 10 mg TID to help stimulate his appetite. Overall patient has tolerated chemotherapy well and lab work is very reassuring. We will proceed with dose increase of temozolomide to 200 mg/m2 D1-5/28D. Plan to continue until intolerance/disease progression for a total of 12 cycles of chemotherapy. Labs will be checked around D26 or D27 of each cycle to confirm tolerance. MRIs will be obtained every 2 cycles or as circumstances dictate. Rationale for serial imaging was discussed.    TEMPUS shows no obviously actionable mutations. There may be an opportunity to target EGFRvIII mutation with osimertinib at PD; however, we will explore this at a later time.     Additionally  we had an extensive discussion about the use of Optune as an adjunct to temozolomide in the maintenance setting (Stupp et al. JAMA Oncol 2015). The scientific rationale, potential therapeutic benefits, and common dermatologic toxicities were discussed as well. The patient understands that compliance of >75% (18 hours/day or greater) is minimal in order to gain the maximum treatment benefit. Patient reached out during to report some scalp redness, which was very mild. We discussed precautions for scalp dermatitis. DSS will see patient at home and adjust the machine.     Patient will return to clinic in 1 month with an MRI prior to appointment. Patient states his understanding and agreement with the plan.    Remainder of plan is outlined below.    Recommendations and Plan:  Right Frontotemporal Glioblastoma (WHO Grade IV)  - s/p gross total resection on 03/21/20 by Dr. Adriana Simas at [facility]  - s/p resection and cyst drainage on 06/22/20 with Dr. Laqueta Jean  - s/p 6000 cGy with Dr. Flonnie Hailstone with concurrent TMZ  - no new MRI to review  - TEMPUS sequencing reviewed - potentially actionable at EGFRvIII mutation  - baseline labs to be obtained today  - proceed C2 of adjuvant temozolomide with dose increase to 200 mg/m2 and continue for a total of 12 cycles or until disease progression/intolerance  - labs around D26 of D27 of each cycle of TMZ to confirm tolerance  - continue using Optune device  - off dexamethasone   - most recent Vitamin D on 07/10/20 was 27.7 - patient taking 2000 units daily - labs will be drawn today and supplementation will be adjusted accordingly    Migraines  - improved  - con't Tylenol PRN as needed  - con't Fioricet PRN  - con't nortriptyline at bedtime  - con't magnesium 400 mg/d and riboflavin 100 mg/d    Supportive Management:  - con't compazine q 6 hours as needed for nausea/vomiting  - ondansetron 8 mg PO 30 minutes prior to chemotherapy and 8 hours as needed for nausea/vomiting  - monitoring for cognitive decline  - monitor for seizures - none thus far  - he will need to have charity care and PAP assistance set up  - increase marinol 10 mg BID AC    Disposition:   - RTC in 1 month (September) with MRI prior to appointment - will see covering provider Marya Amsler NP    I have reviewed the laboratory, pathology, and radiology reports in detail and discussed findings with the patient and family members. The patient and family verbalized understanding of the discussion and plan; all posed questions were answered to their apparent satisfaction. The patient and family were advised to contact us with any questions or concerns that arise prior to their next appointment.    Maxine Glenn, MD  Director, [facility] Neuro-Oncology Program  Assistant Professor, [employer]  Division of Hematology and Medical Oncology    Oncology History Overview Note   Identifying Statement:  Darrell Cannell is a 33 y.o. 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 Initially came to medical attention presenting to [facility] campus ED with a 3 week history of intractable headaches with associated photophobia, blurry vision, and nausea/vomiting.     Molecular Markers:  Ki67: pending  MGMT promotor: unmethylated  IDH: wildtype  TERT promotor: mutated    TEMPUS  Potentially Actionable  EGFRvIII    Biologically Relevant  TERT        c.-124C>T Variant -  Promoter mutation          21.5% VAF  EGFR        p.R108K Missense variant (exon 3) ??? GOF    19.0% VAF  CDKN2A    copy number loss  CDKN2B     copy number loss  EGFR          copy number gain  MTAP copy number loss  EGFR         EGFRvIVb    Variants of Unknown Significance  NTRK2      c.839A>G p.N280S Missense variant             58.7% VAF  CTNNA      c.591A>G p.E197E Splice region variant       42.1% VAF  PDK1         c.137T>C p.V46A Missense variant               29.4% VAF  EGFR        c.3483_3491del p.[id number]              12.3% VAF  SYNE1      c.852C>A p.D284E Missense variant             11.7% VAF  EGFR        c.2946+372_[id number] Variant                          9.6% VAF    Treatment History:  03/15/20: presented to [facility] ED w/ a 3 week history of intractable HAs. CT Head revealed new large ill-defined 6.8 cm hemorrhagic masslike lesion in the right parietotemporal region with prominent surrounding vasogenic edema and mass effect with 1.0 cm leftward midline shift.   03/16/20: brain-lab guided MRI a [facility] confirmed medial right temporal lesion measuring 5.6 x 4.8 x 3.9 cmHGG with satellite lesions as well as a trapped R temporal horn.   03/21/20: s/p brain-lab guided right temporal craniotomy for resection by Dr. Adriana Simas at [facility]. 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  04/17/20: initial eval w/ Jannelle Notaro for GBM; KPS 90; MRI w/ GTR; proceed w/ chemoRT; RTC 2-3 weeks post completion of chemoRT w/ MRI  04/30/20-06/12/20: concurrent chemoRT; 6000 cGy total  06/19/20: KPS 90; MRI w/ large cyst; referred to ED for surgical intervention  06/22/20: admission for resection and cyst drainage with Dr. Laqueta Jean  07/09/20: KPS 90; MRI w/ PD; begin main't TMZ, Optune approval pending; RTC in 1 month w/ lab work to confirm tolerance  07/10/20: C1D1 maintenance TMZ  08/14/20: KPS 90; MRI w/ SD; increase TMZ dose for C2; RTC in 1 mos w/ MRI prior         Interval History:  Darrell Nicklas is a 34 y.o. 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 Darrell Radoncic has a history of HTN, anxiety, and tobacco use. Patient was last seen in clinic on 07/09/20 since which time the patient has begun maintenance temozolomide. I instructed the patient to double his vitamin D supplementation after our last visit. He has followed with Dr. Laqueta Jean for post craniotomy monitoring. He has followed with Dr. Sheppard Evens of ophthalmology for his myopia. He begun using Optune, but stopped after noticing some burns from the contacts.     On our discussion today, the patient reports he has tolerated his first cycle  of maintenance temozolomide with minimal difficulty. He reports some occasional headaches that he mages with Fioricet and sumatriptan. He reports these headaches are triggered by strong scents and high pitched noises. He also reports that colors seem brighter since his surgery. He reports some mild nausea that has no been relieved by Zofran. Marinol has not done much to stimulate his appetite. He has used mirtazapine which has helped his sleep. He is off dexamethasone. He denies any seizures, confusion or balance issues.     Review of Systems:  As noted within the HPI; otherwise, negative on 12 system review.    Allergies:  No Known Allergies    Medications:  Current Outpatient Medications   Medication Sig Dispense Refill   ??? acetaminophen (TYLENOL) 325 MG tablet Take 650 mg by mouth daily as needed for pain.     ??? ALPRAZolam (XANAX) 1 MG tablet Take 1 mg by mouth two (2) times a day as needed.      ??? baclofen (LIORESAL) 20 MG tablet Take 20 mg by mouth Three (3) times a day.     ??? butalbital-acetaminophen-caffeine (ESGIC) 50-325-40 mg per tablet Take 1 tablet by mouth every four (4) hours as needed for pain. 120 tablet 3   ??? cholecalciferol, vitamin D3-50 mcg, 2,000 unit,, (VITAMIN D3) 50 mcg (2,000 unit) cap Take 50 mcg by mouth daily.     ??? dronabinoL (MARINOL) 5 MG capsule Take 1 capsule (5 mg total) by mouth Three (3) times a day with a meal. 90 capsule 0   ??? ibuprofen (ADVIL,MOTRIN) 200 MG tablet Take 600 mg by mouth daily as needed for pain.     ??? levETIRAcetam (KEPPRA) 1000 MG tablet Take 1 tablet (1,000 mg total) by mouth Two (2) times a day. 180 tablet 3   ??? magnesium oxide (MAG-OX) 400 mg (241.3 mg elemental magnesium) tablet Take 1 tablet (400 mg total) by mouth daily. 200 tablet 1   ??? mirtazapine (REMERON) 7.5 MG tablet Take 1 tablet (7.5 mg total) by mouth nightly. 30 tablet 3   ??? nortriptyline (PAMELOR) 25 MG capsule 3 tab PO at bedtime 90 capsule 5   ??? ondansetron (ZOFRAN-ODT) 8 MG disintegrating tablet Dissolve 1 tablet on the tongue 30 minutes before chemo and every 8 hours as needed for nausea. 60 tablet 10   ??? ostomy supplies (SKIN-PREP SPRAY-ON DRESSING) SprA Apply to clean dry skin before wearing Optune device. 118 mL 0   ??? prochlorperazine (COMPAZINE) 10 MG tablet Take 1 tablet (10 mg total) by mouth Three (3) times a day before meals. 90 tablet 3   ??? riboflavin, vitamin B2, 100 mg Tab Take 1 tablet (100 mg total) by mouth daily. 90 tablet 3   ??? SUMAtriptan (IMITREX) 50 MG tablet Take 1 tablet (50 mg total) by mouth once as needed for migraine. Use only once daily as needed. 30 tablet 2   ??? temozolomide (TEMODAR) 180 mg capsule Take 2 capsules (total dose 360 mg) by mouth at bedtime for 5 consecutive nights every 28 days.  Take Zofran 30 min before temozolomide dose. 10 capsule 0   ??? traZODone (DESYREL) 50 MG tablet TAKE 1/2 TO 1 TABLET(25 TO 50 MG) BY MOUTH EVERY NIGHT AS NEEDED FOR SLEEP 30 tablet 1     No current facility-administered medications for this visit.     Xanax 1 mg qHs for anxiety  propranalol for blood pressure  baclofen for irregular tachycardia / cardiomyopathy (managed by PCP, Dr. Barnett Hatter)  Not taking Prozac    Past Medical History:  Past Medical History:   Diagnosis Date   ??? Migraine        Past Surgical History:  Past Surgical History:   Procedure Laterality Date   ??? PR EXCIS SUPRATENT BRAIN TUMOR Right 06/22/2020    Procedure: CRANIECTOMY; EXC BRAIN TUMOR-SUPRATENTORIAL;  Surgeon: Corlis Leak, MD;  Location: MAIN OR [facility];  Service: Neurosurgery   ??? PR MICROSURG TECHNIQUES,REQ OPER MICROSCOPE Right 06/22/2020    Procedure: MICROSURGICAL TECHNIQUES, REQUIRING USE OF OPERATING MICROSCOPE (LIST SEPARATELY IN ADDITION TO CODE FOR PRIMARY PROCEDURE);  Surgeon: Corlis Leak, MD;  Location: MAIN OR [facility];  Service: Neurosurgery   ??? PR STEREOTACTIC COMP ASSIST PROC,CRANIAL,INTRADURAL N/A 06/22/2020    Procedure: STEREOTACTIC COMPUTER-ASSISTED (NAVIGATIONAL) PROCEDURE; CRANIAL, INTRADURAL;  Surgeon: Corlis Leak, MD;  Location: MAIN OR [facility];  Service: Neurosurgery       Social History:  Social History     Socioeconomic History   ??? Marital status: Single     Spouse name: Not on file   ??? Number of children: Not on file   ??? Years of education: Not on file   ??? Highest education level: Not on file   Occupational History   ??? Not on file   Tobacco Use   ??? Smoking status: Current Every Day Smoker     Packs/day: 0.40     Years: 10.00     Pack years: 4.00     Types: Cigarettes   ??? Smokeless tobacco: Never Used   Vaping Use   ??? Vaping Use: Every day   Substance and Sexual Activity   ??? Alcohol use: Yes   ??? Drug use: Never   ??? Sexual activity: Not on file   Other Topics Concern   ??? Not on file   Social History Narrative   ??? Not on file     Social Determinants of Health     Financial Resource Strain:    ??? Difficulty of Paying Living Expenses:    Food Insecurity:    ??? Worried About Programme researcher, broadcasting/film/video in the Last Year:    ??? Barista in the Last Year:    Transportation Needs:    ??? Freight forwarder (Medical):    ??? Lack of Transportation (Non-Medical):    Physical Activity:    ??? Days of Exercise per Week:    ??? Minutes of Exercise per Session:    Stress:    ??? Feeling of Stress :    Social Connections:    ??? Frequency of Communication with Friends and Family:    ??? Frequency of Social Gatherings with Friends and Family:    ??? Attends Religious Services:    ??? Database administrator or Organizations:    ??? Attends Engineer, structural:    ??? 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     Family History:  Family History   Problem Relation Age of Onset   ??? No Known Problems Mother    ??? No Known Problems Father    ??? No Known Problems Sister    ??? No Known Problems Brother    ???  No Known Problems Maternal Aunt    ??? No Known Problems Maternal Uncle    ??? No Known Problems Paternal Aunt    ??? No Known Problems Paternal Uncle    ??? No Known Problems Maternal Grandmother    ??? No Known Problems Maternal Grandfather    ??? No Known Problems Paternal Grandmother    ??? No Known Problems Paternal Grandfather    ??? No Known Problems Other    ??? Amblyopia Neg Hx    ??? Blindness Neg Hx    ??? Cancer Neg Hx    ??? Cataracts Neg Hx    ??? Diabetes Neg Hx    ??? Glaucoma Neg Hx    ??? Hypertension Neg Hx    ??? Macular degeneration Neg Hx    ??? Retinal detachment Neg Hx    ??? Strabismus Neg Hx    ??? Stroke Neg Hx    ??? Thyroid disease Neg Hx         History of cancer in both maternal and paternal grandmothers (lung cancer and intra-abdominal cancer)  No family history of brain tumors.      Physical Examination:  Vitals:   Vitals:    08/14/20 0908   BP: 121/87   Pulse: 87   Resp: 16   Temp: 36.6 ??C (97.8 ??F)   SpO2: 100%       General: Alert, calm, cooperative in no acute distress.  Head: Normocephalic, atraumatic. Well-approximated healing craniotomy incision without erythema, edema, dehiscence, or discharge.  EENT: No conjunctival injection or scleral icterus. Oral mucosa moist without lesions.  Lymphatic: no cervical or supraclavicular LAD  Lungs: Clear to auscultation bilaterally.  Cardiac: Regular rate and rhythm without murmurs, rubs, or gallops.  Abdomen: Active bowel sounds noted. Soft, nontender abdomen.  Skin: Texture, turgor, and pigmentation appear normal. No rashes, cyanosis, or petechiae.  Extremities: No clubbing, cyanosis, edema, or varicosities noted.    Neurological Examination:  Mental Status: Patient alert and oriented x 4 with affect appropriate to the situation.   Speech: Fluent and spontaneous speech. No expressive aphasia. No difficulty with repetition No verbal perseveration No paraphasic errors. No dysarthria. No receptive aphasia.    Cranial Nerves  Vision (II): Visual acuity is grossly without impairment. Visual fields are full.    EOMs (III,IV,VI): Gaze and tracking appear intact with smooth pursuits, no saccades. No ptosis.    Pupils (III): Pupils are equal, round, and reactive to light and accommodation.    Face (V, VII): Facial sensation is intact. Muscles of mastication full and symmetric.  Hearing (VIII): Hearing is grossly without impairment and intact to conversational speech.  Gag/Swallow (IX): Gag reflex testing deferred. Voice without hoarseness.    Palate (X): Elevates symmetrically.    Neck Strength (XI): Sternocleidomastoid and trapezius strength full and symmetric.  Tongue (XII): Midline without fasciculations.    Motor  Motor exam:  Mild resting tremor. normal muscle mass and tone in all extremities, and no pronator drift. Grossly 5/5.    Reflexes- not tested today    Sensory: Intact to light touch.  Coordination: grossly intact  Gait: Steady and normal gait;      Laboratory:  Lab Results   Component Value Date    WBC 8.3 08/14/2020    HGB 16.0 08/14/2020    HCT 46.4 08/14/2020    PLT 253 08/14/2020       Lab Results   Component Value Date    NA 138 08/14/2020    K 4.1  08/14/2020    CL 107 08/14/2020    CO2 28.0 08/14/2020    BUN <5 (L) 08/14/2020    CREATININE 0.94 08/14/2020    GLU 86 08/14/2020    CALCIUM 9.3 08/14/2020    MG 2.1 06/22/2020    PHOS 3.7 06/22/2020       Lab Results   Component Value Date    BILITOT 1.0 08/14/2020    BILIDIR 0.40 03/15/2020    PROT 6.4 08/14/2020    ALBUMIN 3.5 08/14/2020    ALT 18 08/14/2020    AST 14 08/14/2020    ALKPHOS 52 08/14/2020       Lab Results   Component Value Date    INR 0.96 06/22/2020    APTT 26.6 06/22/2020       Imaging:  MRI scans were personally reviewed.    Pathology:   Final Diagnosis   (Outside case: [phone], 12 slides, original collection date 03/21/2020)  ??  A: Brain, right frontotemporal lesion, resection  - Glioblastoma (WHO grade IV)  - See comment    We agree with original pathologist diagnosis.  Microscopic examination shows a densely cellular infiltrating diffuse glioma with mitotic figures, areas of tumor necrosis, and microvascular proliferation. Multiple calcospherites are present in the glioma. The glioma focally infiltrates overlying leptomeninges.  Immunohistochemical stains were performed at the originating institution and are reviewed at [facility].  Immunohistochemical stain for IDH1 R132H is negative in tumor cells.  Immunohistochemical stain for ATRX  shows diffusely retained expression within the tumor cells.  ??  IDH1/2 and TERT promoter mutation studies are in process at [facility], and [facility] will issue results as an addendum to their Surgical Pathology report. We have requested unstained sections for MGMT status and for Ki-67 IHC.     Scribe's Attestation:   Documentation assistance was provided by me personally, Levy Pupa, a scribe. Maxine Glenn, MD obtained and performed the history, physical exam and medical decision making elements that were entered into the chart.   Signed by Levy Pupa, Scribe, on 08/14/20 at 10:56 AM.  ----------------------------------------------------------------------------------------------------------------------  August 14, 2020 2:41 PM. Documentation assistance provided by the Scribe. I was present during the time the encounter was recorded. The information recorded by the Scribe was done at my direction and has been reviewed and validated by me.  ----------------------------------------------------------------------------------------------------------------------        Maxine Glenn, MD  Director, [facility] Neuro-Oncology Program  Assistant Professor, [employer]  Division of Hematology and Medical Oncology

## 2020-08-15 LAB — VITAMIN D, TOTAL (25OH): Lab: 42.7

## 2020-08-15 NOTE — Unmapped (Signed)
Clinical Assessment Needed For: Dose Change  Medication: Temozolomide  Last Fill Date: 07/11/20  Copay $3 (each)  Was previous dose already scheduled to fill: No    Notes to Pharmacist: dose increase
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
## 2020-08-16 MED FILL — ONDANSETRON 8 MG DISINTEGRATING TABLET: 20 days supply | Qty: 60 | Fill #1 | Status: AC

## 2020-08-16 MED FILL — TEMOZOLOMIDE 180 MG CAPSULE: 28 days supply | Qty: 5 | Fill #0 | Status: AC

## 2020-08-16 MED FILL — TEMOZOLOMIDE 250 MG CAPSULE: 28 days supply | Qty: 5 | Fill #0 | Status: AC

## 2020-08-16 MED FILL — TEMOZOLOMIDE 180 MG CAPSULE: 28 days supply | Qty: 5 | Fill #0

## 2020-08-16 NOTE — Unmapped (Signed)
Called patient to update on TMZ delivery.    Darrell [name] states he already has medication.  It was delivered today.    He will start tonight.  He has my call back number if any issues arise.

## 2020-08-17 NOTE — Unmapped (Signed)
[facility]  Oncology Outpatient Social Work      Reason for [employer]:  Financial assistance with household bills.    Oncology Treatment Update:  Pt is taking oral chemotherapy, which has a regimen of 5 days on followed by 23 days off, and will continue for a full year (through summer 2022).    Psychosocial Update and Case Management:  T/C from patient.  He is going to move in next month with a friend in [place], so that he can be closer to [facility] treatment center.  He has been staying with his brother and brother's kids, but he needs a quieter environment and to be closer to the treatment center.  He remains unemployed and would like to contribute to some initial rent and move-in costs for his friend, who is allowing him to live there for free, essentially.  Pt is in active treatment and eligible for financial assistance with household bills.  Requested that he provide this SW'er with household bills and copy of rental agreement when he is ready for this SW'er to apply.     Follow-Up Plan:  Pt has this SW'ers contact information and will reach out for psychosocial assistance as needed.     Claris Gladden, OSW-C  Oncology Outpatient Social Worker  Phone: [phone]
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
## 2020-08-27 DIAGNOSIS — M546 Pain in thoracic spine: Principal | ICD-10-CM

## 2020-08-28 NOTE — Unmapped (Signed)
Spoke with patient.    I called [place] this morning to return phone call yesterday.    He was wondering if he could go back to work part-time.    I certainly agree that if he is feeling capable of holding a part time job it could help get his mind off things.      We discussed safety precautions.  He should wear a mask at all times.  Wash his hands frequently.      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  We discussed staying away from hot stove burners.  He stated he would like to cut vegetables and help out when he is able.  He has been cooking some since diagnosis.    I also wished [place] a happy birthday.  He was appreciative and will reach out if he needs anything from [place] for his job.

## 2020-08-29 ENCOUNTER — Institutional Professional Consult (permissible substitution)
Admit: 2020-08-29 | Discharge: 2020-08-30 | Payer: PRIVATE HEALTH INSURANCE | Attending: Pharmacist Clinician (PhC)/ Clinical Pharmacy Specialist | Primary: Pharmacist Clinician (PhC)/ Clinical Pharmacy Specialist

## 2020-08-29 DIAGNOSIS — C719 Malignant neoplasm of brain, unspecified: Principal | ICD-10-CM

## 2020-08-29 NOTE — Unmapped (Signed)
Clinical Pharmacist Practitioner: Neuro-Oncology Clinic - Telephone Encounter    I spent 20 minutes on the phone with the patient on the date of service. I spent an additional 15 minutes on pre- and post-visit activities on the date of service.     The patient was physically located in [place] or a state in which I am permitted to provide care. The patient and/or parent/guardian understood that s/he may incur co-pays and cost sharing, and agreed to the telemedicine visit. The visit was reasonable and appropriate under the circumstances given the patient's presentation at the time.    The patient and/or parent/guardian has been advised of the potential risks and limitations of this mode of treatment (including, but not limited to, the absence of in-person examination) and has agreed to be treated using telemedicine. The patient's/patient's family's questions regarding telemedicine have been answered.     If the visit was completed in an ambulatory setting, the patient and/or parent/guardian has also been advised to contact their provider???s office for worsening conditions, and seek emergency medical treatment and/or call 911 if the patient deems either necessary.    Patient Name: Darrell [name]  Patient Age: 34 y.o.    Assessment and recommendations:  Darrell [name] is a pleasant 34 y.o. 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 The patient initially came to medical attention presenting to the [facility] ED on 03/15/20 with a 3 week history of intractable headaches with associated photophobia, blurry and double vision, and nausea/vomiting. CT Head in the ED revealed a new large ill-defined 6.8 cm hemorrhagic masslike lesion in the right parietotemporal region with prominent surrounding vasogenic edema and mass effect with 1.0 cm leftward midline shift. He was offered direct admission to the [facility] Neuro ICU at that time however patient decided to go home to be with family before undergoing further workup. He subsequently went to [facility] the following day 03/16/20 where he recieved IV dexamethasone and had brain-lab guided MRI that confirmed medial right temporal lesion measuring 5.6 x 4.8 x 3.9 cmHGG with satellite lesions as well as a trapped R temporal horn. He is s/p brain-lab guided right temporal craniotomy for gross total resection of the tumor on 03/21/20 by Dr. Adriana Simas at [facility]. 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 Started chemorads on 5/3 and after day 2 had a seizure. MRI at [facility] showed 1. Interval resection of right temporal lobe mass with areas of suspected tumor. Compared to preoperative MRI there are areas of improvement and areas of progression.??Interval enlargement of the anterior right temporal horn of the right lateral ventricle which may represent some degree of entrapment. Interval decrease in abnormal T2 signal, mass effect, and midline shift within the right temporal lobe.    1. Right Frontotemporal Glioblastoma (WHO Grade IV)- today is C2D14 temozolomide maintenance doses on 8/19-8/23. Continues to have trouble with fatigue, appetite and nausea  - MRI 9/13 and CBC/diff and CMP on 9/15 and consideration for cycle 3  - ondansetron 8 mg 30 min prior to chemo and q8h as needed for nausea  - TEMPUS sequencing pending  ??  2. Tumor-Related Headaches-Headaches worse over the past week taking fioricet frequently as well as ondansetron. Imitrex does not help, also taking nortriptyline and proranolol  - con't Tylenol PRN as needed  - limit fioricet to once per week for severe headache  - limit ondansetron to reduce drug related headaches  - on nortriptyline  70 mg at bedtime and on propranolol 20 mg BID (for panic attacks)    3. Nausea- has had worsening nausea over past 4 days, has been taking prochlorperazine and ondansetron as needed.  - Start prochlorperazine 10 mg TID  ??  3. Secondary Seizure disorder- has a seizure on 5/4 s/p day 2 of radiation. Went to [facility] ED, started on keppra. Not reported seizures since ER visit on 5/4. MRI findings above.   - On keppra 1000 mg BID   - Has follow up scheduled with [facility] on 05/09/20 for evaluation    4. Poor Appetite- reports a poor appetite which pre-dated starting his therapy but thinks it coincided with his diagnosis. Improved with increased marinol dose.   - Continue marinol 10 mg BID 30 min prior to meals  - nutrition consult    5. Anxiety/panic attacks- currently well controlled per patient report with xanax which he takes 2-3 times per day as needed and propranolol  - Continue xanax and propranolol as needed for panic attacks.   - Would like [facility] referral    6. 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 This could be important to officially diagnose as patients with mutations of UGT1A1 can have life threating complications after receiving irinotecan and patients with homozygous UGT1A1*28 allele are at the greatest risk for complications.  - With irinotecan being a possible subsequent line therapy in GBM would recommend obtaining UGT1A1 DNA analysis     Follow- up: Dr. Theodoro Kalata 8/17, CPP s/p cycle 2  ______________________________________________________________________    Reason for visit:  Temozolomide monitoring and side effect management    Current Drug and Dose: Cycle 1 150 mg/m2    Date of Initiation: 07/12/20, cycle 2- 08/16/20,    Oral Agent Toxicities:  Fatigue, mild nausea, appetite    Interval History:  Darrell [name] completed cycle 2 of chemotherapy. He does report he hs had more headaches over the past week he has tried fioricet, nortriptyline, ibuprofen and imitrex with minimal relief. He also has had more nausea over the past 4 days. He takes compazine in the morning and ondansetron mid day. When he wakes up his headaches feels like a hangover but as the day goes on it gets a little worse, midday is the worst then improves towards night time. He also has felt more fatigued the past 24 hours. He has been eating better with the marinol 10 mg BID.     Adherence: Took appropriately no missed doses.    Drug Interactions: none    Oncology History Overview Note   Identifying Statement:  Darrell [name] is a 34 y.o. 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 Initially came to medical attention presenting to [facility] ED with a 3 week history of intractable headaches with associated photophobia, blurry vision, and nausea/vomiting.     Molecular Markers:  Ki67: pending  MGMT promotor: unmethylated  IDH: wildtype  TERT promotor: mutated    TEMPUS  Potentially Actionable  EGFRvIII    Biologically Relevant  TERT        c.-124C>T Variant - Promoter mutation          21.5% VAF  EGFR        p.R108K Missense variant (exon 3) ??? GOF    19.0% VAF  CDKN2A    copy number loss  CDKN2B     copy number  loss  EGFR          copy number gain  MTAP copy number loss  EGFR         EGFRvIVb    Variants of Unknown Significance  NTRK2      c.839A>G p.N280S Missense variant             58.7% VAF  CTNNA      c.591A>G p.E197E Splice region variant       42.1% VAF  PDK1         c.137T>C p.V46A Missense variant               29.4% VAF  EGFR        c.3483_3491del p.[id number]              12.3% VAF  SYNE1      c.852C>A p.D284E Missense variant             11.7% VAF  EGFR        c.2946+372_[id number] Variant                          9.6% VAF    Treatment History:  03/15/20: presented to [facility] ED w/ a 3 week history of intractable HAs. CT Head revealed new large ill-defined 6.8 cm hemorrhagic masslike lesion in the right parietotemporal region with prominent surrounding vasogenic edema and mass effect with 1.0 cm leftward midline shift.   03/16/20: brain-lab guided MRI a [facility] confirmed medial right temporal lesion measuring 5.6 x 4.8 x 3.9 cmHGG with satellite lesions as well as a trapped R temporal horn.   03/21/20: s/p brain-lab guided right temporal craniotomy for resection by Dr. Adriana Simas at [facility]. 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  04/17/20: initial eval w/ [facility] for GBM; KPS 90; MRI w/ GTR; proceed w/ chemoRT; RTC 2-3 weeks post completion of chemoRT w/ MRI  04/30/20-06/12/20: concurrent chemoRT; 6000 cGy total  06/19/20: KPS 90; MRI w/ large cyst; referred to ED for surgical intervention  06/22/20: admission for resection and cyst drainage with Dr. Laqueta Jean  07/09/20: KPS 90; MRI w/ PD; begin main't TMZ, Optune approval pending; RTC in 1 month w/ lab work to confirm tolerance  07/10/20: C1D1 maintenance TMZ  08/14/20: KPS 90; MRI w/ SD; increase TMZ dose for C2; RTC in 1 mos w/ MRI prior         Vital Signs for this encounter:  @VS @  Wt Readings from Last 3 Encounters:   08/14/20 89 kg (196 lb 3.2 oz)   07/10/20 94.2 kg (207 lb 11.2 oz)   06/23/20 95.7 kg (210 lb 15.7 oz)       Medications:  Current Outpatient Medications   Medication Sig Dispense Refill   ??? acetaminophen (TYLENOL) 325 MG tablet Take 650 mg by mouth daily as needed for pain.     ??? ALPRAZolam (XANAX) 1 MG tablet Take 1 mg by mouth two (2) times a day as needed.      ??? baclofen (LIORESAL) 20 MG tablet Take 20 mg by mouth Three (3) times a day.     ??? butalbital-acetaminophen-caffeine (ESGIC) 50-325-40 mg per tablet Take 1 tablet by mouth every four (4) hours as needed for pain. 120 tablet 3   ??? cholecalciferol, vitamin D3-50 mcg, 2,000 unit,, (VITAMIN D3) 50 mcg (2,000 unit) cap Take 50 mcg by mouth daily.     ??? dronabinoL (MARINOL) 5 MG  capsule Take 2 capsules (10 mg total) by mouth Two (2) times a day (30 minutes before a meal). 120 capsule 3   ??? ibuprofen (ADVIL,MOTRIN) 200 MG tablet Take 600 mg by mouth daily as needed for pain.     ??? levETIRAcetam (KEPPRA) 1000 MG tablet Take 1 tablet (1,000 mg total) by mouth Two (2) times a day. 180 tablet 3   ??? magnesium oxide (MAG-OX) 400 mg (241.3 mg elemental magnesium) tablet Take 1 tablet (400 mg total) by mouth daily. 200 tablet 1   ??? mirtazapine (REMERON) 7.5 MG tablet Take 1 tablet (7.5 mg total) by mouth nightly. 30 tablet 3   ??? nortriptyline (PAMELOR) 25 MG capsule 3 tab PO at bedtime 90 capsule 5   ??? ondansetron (ZOFRAN) 8 MG tablet One tab PO 30 minutes prior chemo and q8h PRN nausea 60 tablet 2   ??? ondansetron (ZOFRAN-ODT) 8 MG disintegrating tablet Dissolve 1 tablet on the tongue 30 minutes before chemo and every 8 hours as needed for nausea. 60 tablet 10   ??? ostomy supplies (SKIN-PREP SPRAY-ON DRESSING) SprA Apply to clean dry skin before wearing Optune device. 118 mL 0   ??? prochlorperazine (COMPAZINE) 10 MG tablet Take 1 tablet (10 mg total) by mouth Three (3) times a day before meals. 90 tablet 3   ??? riboflavin, vitamin B2, 100 mg Tab Take 1 tablet (100 mg total) by mouth daily. 90 tablet 3   ??? SUMAtriptan (IMITREX) 50 MG tablet Take 1 tablet (50 mg total) by mouth once as needed for migraine. Use only once daily as needed. 30 tablet 2   ??? temozolomide (TEMODAR) 180 mg capsule Take 1 (180mg ) capsule with 1 (250mg ) capsule by mouth for 5 consecutive days every 28 days. Take 30 minutes after Zofran at bedtime. Total dose = 430mg . 5 capsule 1   ??? temozolomide (TEMODAR) 250 mg capsule Take 1 (250mg ) capsule with 1 (180mg ) capsule by mouth for 5 consecutive days every 28 days. Take 30 minutes after Zofran at bedtime. Total dose = 430mg . 5 capsule 1   ??? traZODone (DESYREL) 50 MG tablet TAKE 1/2 TO 1 TABLET(25 TO 50 MG) BY MOUTH EVERY NIGHT AS NEEDED FOR SLEEP 30 tablet 1     No current facility-administered medications for this visit.       LABS:  Lab Results   Component Value Date    WBC 8.3 08/14/2020    HGB 16.0 08/14/2020    HCT 46.4 08/14/2020    PLT 253 08/14/2020       Lab Results   Component Value Date    NA 138 08/14/2020    K 4.1 08/14/2020    CL 107 08/14/2020    CO2 28.0 08/14/2020    BUN <5 (L) 08/14/2020    CREATININE 0.94 08/14/2020    GLU 86 08/14/2020    CALCIUM 9.3 08/14/2020    MG 2.1 06/22/2020    PHOS 3.7 06/22/2020       Lab Results   Component Value Date    BILITOT 1.0 08/14/2020    BILIDIR 0.40 03/15/2020    PROT 6.4 08/14/2020    ALBUMIN 3.5 08/14/2020    ALT 18 08/14/2020    AST 14 08/14/2020    ALKPHOS 52 08/14/2020       Lab Results   Component Value Date    INR 0.96 06/22/2020    APTT 26.6 06/22/2020     I spent 20 minutes in direct patient care.  Katie P. Chriss Mannan PharmD, BCOP, CPP  Hematology/Oncology Pharmacist  P: [phone]

## 2020-08-30 ENCOUNTER — Telehealth: Admit: 2020-08-30 | Discharge: 2020-08-31 | Payer: PRIVATE HEALTH INSURANCE | Attending: Clinical | Primary: Clinical

## 2020-08-30 NOTE — Unmapped (Signed)
[facility]  Comprehensive Cancer Support Program   Telehealth Encounter      Encounter Description: This encounter was conducted via telephone in the setting of State of Emergency due to COVID-19 Pandemic.     I spent 40 minutes on the phone with the patient on the date of service. I spent an additional 25 minutes on pre- and post-visit activities on the date of service.     The patient was physically located in [place] or a state in which I am permitted to provide care. The patient and/or parent/guardian understood that s/he may incur co-pays and cost sharing, and agreed to the telemedicine visit. The visit was reasonable and appropriate under the circumstances given the patient's presentation at the time.    The patient and/or parent/guardian has been advised of the potential risks and limitations of this mode of treatment (including, but not limited to, the absence of in-person examination) and has agreed to be treated using telemedicine. The patient's/patient's family's questions regarding telemedicine have been answered.     If the visit was completed in an ambulatory setting, the patient and/or parent/guardian has also been advised to contact their provider???s office for worsening conditions, and seek emergency medical treatment and/or call 911 if the patient deems either necessary.    Assessment:  Darrell [name] is a 34 y.o. 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 Currently on oral chemo. Referred to [facility] for support around adjustment to diagnosis.       Risk Assessment:  A suicide and violence risk assessment was performed as part of this evaluation. There patient is deemed to be at chronic elevated risk for self-harm/suicide given the following factors: male age 60-35 and recent onset of serious medical condition. There patient is deemed to be at chronic elevated risk for violence given the following factors: male gender and younger age. These risk factors are mitigated by the following factors:lack of active SI/HI, no history of previous suicide attempts , utilization of positive coping skills, supportive family, sense of responsibility to family and social supports, minor children living at home, presence of an available support system, enjoyment of leisure actvities, expresses purpose for living, effective problem solving skills, safe housing and support system in agreement with treatment recommendations. There is no acute risk for suicide or violence at this time. The patient was educated about relevant modifiable risk factors including following recommendations for treatment of psychiatric illness and abstaining from substance abuse. While future psychiatric events cannot be accurately predicted, the patient does not currently require  acute inpatient psychiatric care and does not currently meet [facility] involuntary commitment criteria.        Plan:  Will continue to follow for supportive counseling. Appt scheduled 9/22.     Sent referral message to AYA team.      Subjective:   Patient interviewed in a private place, accompanied by no one. Reports that he wasn't able to log in for video visit, completed visit via telephone. Shared about his experience with diagnosis, stating that he feels like he is coping fairly well, all things considered. States that he is focused on maintaining his sense of humor and being himself, no matter what. 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 States that he doesn't know if he will ever be able to go back to work and is looking into applying for disability.  He has a 32 and 35 year old, who live with his ex, who has remarried. Until 2 months ago, he had his children every other weekend, but hasn't seen them since his surgery. He is consulting with a lawyer about ways to advocate for shared custody/visitation. Currently is living with his little brother (Pt is one of 5 brothers) and brother's two children and girlfriend. States that he appreciates the support, but that it can be difficult to live with his niece and nephew who can be loud and make sleeping difficult. States that he is focused on exhausting all potential treatment options and motivated to live as long as possible, stating that he owes it to his family and children to fight.     Denies depression. Endorses ongoing anxiety, which he describes as a life-long issue. States that he is able to manage nighttime anxiety with xanax, but that he doesn't like taking it during the day and would like to find a medication that is less sedating for anxiety. Pt had to cancel appt with Dr. Charlann Noss due to having multiple appts on the same day, but would like to reschedule. Let him know that I would send Dr. Lyndal Rainbow for scheduling follow-up.     Discussed scope and role of [facility] psycho-oncology. Patient is open to meeting again and also interested in connecting with AYA program. Will send referral message to AYA pool. Time spent discussing history, current coping, establishing rapport and providing supportive counseling. Follow-up scheduled for two week.       Objective:    Mental Status Exam:  Speech/Language:    Normal rate, volume, tone, fluency   Mood:   euthymic   Thought process and Associations:   Logical, linear, clear, coherent, goal directed   Abnormal/psychotic thought content:     Denies SI, HI, self harm, delusions, obsessions, paranoid ideation, or ideas of reference   Perceptual disturbances:     Does not endorse auditory or visual hallucinations     Orientation:   Oriented to person, place, time, and general circumstances   Insight:     Fair   Judgment:    Fair   Impulse Control:   Fair     Frederik Schmidt, LCSW  Comprehensive Cancer Support Program  Phone: [phone]  Pager: [phone]

## 2020-09-06 DIAGNOSIS — G43809 Other migraine, not intractable, without status migrainosus: Principal | ICD-10-CM

## 2020-09-06 MED ORDER — NORTRIPTYLINE 50 MG CAPSULE
ORAL_CAPSULE | 2 refills | 0 days | Status: CP
Start: 2020-09-06 — End: ?

## 2020-09-06 NOTE — Unmapped (Signed)
Called Mr. Veneman to follow-up on symptom of increased headache. He states that he has limited fioricet use to once per week and is taking nortriptyline 75 mg at bedtime and Tylenol as needed. His headaches were described as throbbing since he wakes up. With these increased headaches (today is the third day), it has caused him to vomit and also states feeling nauseous.    Recommendations:  ?? Increase nortriptyline dose to 100 mg total at bedtime and counseled Mr. Teubner on anticholinergic side effects and changes to mental status  ?? Will send a new prescription for 50 mg capsules to pharmacy  ?? Monitor nausea/vomiting to see if improved with increased nortriptyline dose    Follow-up: Brigid on 09/12/20, CPP f/u on 09/12/20 during clinic visit    Chilton Greathouse, PharmD  PGY-2 Hematology/Oncology Pharmacy Resident

## 2020-09-10 ENCOUNTER — Ambulatory Visit
Admit: 2020-09-10 | Discharge: 2020-09-14 | Disposition: A | Discharge status: Home / Self Care | Payer: PRIVATE HEALTH INSURANCE

## 2020-09-10 ENCOUNTER — Encounter
Admit: 2020-09-10 | Discharge: 2020-09-14 | Disposition: A | Discharge status: Home / Self Care | Payer: PRIVATE HEALTH INSURANCE

## 2020-09-10 ENCOUNTER — Ambulatory Visit
Admit: 2020-09-10 | Discharge: 2020-09-11 | Disposition: A | Discharge status: Home / Self Care | Payer: PRIVATE HEALTH INSURANCE

## 2020-09-10 LAB — BASIC METABOLIC PANEL
BLOOD UREA NITROGEN: <5 mg/dL — ABNORMAL LOW (ref 9–23)
CALCIUM: 9.1 mg/dL (ref 8.7–10.4)
CHLORIDE: 112 mmol/L — ABNORMAL HIGH (ref 98–107)
CO2: 28 mmol/L (ref 20.0–31.0)
CREATININE: 0.72 mg/dL
EGFR CKD-EPI NON-AA MALE: >90 mL/min/{1.73_m2} (ref >=60)
GLUCOSE RANDOM: 101 mg/dL (ref 70–179)
POTASSIUM: 3.8 mmol/L (ref 3.4–4.5)
SODIUM: 142 mmol/L (ref 135–145)

## 2020-09-10 LAB — CBC W/ AUTO DIFF
BASOPHILS ABSOLUTE COUNT: 0 10*9/L (ref 0.0–0.1)
BASOPHILS RELATIVE PERCENT: 0.3 %
EOSINOPHILS ABSOLUTE COUNT: 0.3 10*9/L (ref 0.0–0.7)
HEMATOCRIT: 45.4 % (ref 38.0–50.0)
HEMOGLOBIN: 15.6 g/dL (ref 13.5–17.5)
LYMPHOCYTES ABSOLUTE COUNT: 0.7 10*9/L (ref 0.7–4.0)
LYMPHOCYTES RELATIVE PERCENT: 10.6 %
MEAN CORPUSCULAR HEMOGLOBIN CONC: 34.3 g/dL (ref 30.0–36.0)
MEAN CORPUSCULAR VOLUME: 96.2 fL — ABNORMAL HIGH (ref 81.0–95.0)
MEAN PLATELET VOLUME: 9.1 fL (ref 7.0–10.0)
MONOCYTES ABSOLUTE COUNT: 0.2 10*9/L (ref 0.1–1.0)
MONOCYTES RELATIVE PERCENT: 3.5 %
NEUTROPHILS ABSOLUTE COUNT: 5.2 10*9/L (ref 1.7–7.7)
NEUTROPHILS RELATIVE PERCENT: 80.5 %
NUCLEATED RED BLOOD CELLS: 0 /100{WBCs} (ref <=4)
PLATELET COUNT: 193 10*9/L (ref 150–450)
RED CELL DISTRIBUTION WIDTH: 13.3 % (ref 12.0–15.0)
WBC ADJUSTED: 6.5 10*9/L (ref 3.5–10.5)

## 2020-09-10 LAB — MEAN PLATELET VOLUME: Platelet mean volume:EntVol:Pt:Bld:Qn:Automated count: 9.1

## 2020-09-10 LAB — CO2: Carbon dioxide:SCnc:Pt:Ser/Plas:Qn:: 28

## 2020-09-10 MED ADMIN — dexamethasone (DECADRON) 4 mg/mL injection 6 mg: 6 mg | INTRAVENOUS | @ 22:00:00 | Stop: 2020-09-10

## 2020-09-10 MED ADMIN — MORPhine 4 mg/mL injection 4 mg: 4 mg | INTRAVENOUS | Stop: 2020-09-10

## 2020-09-10 MED ADMIN — gadobenate dimeglumine (MULTIHANCE) 529 mg/mL (0.1mmol/0.2mL) solution 18 mL: 18 mL | INTRAVENOUS | @ 19:00:00 | Stop: 2020-09-10

## 2020-09-10 NOTE — Unmapped (Signed)
Pt presents for eval of increased headaches with nausea and visual changes. Pt had MRI completed and was sent to ED by radiology for changes on his MRI compared to his last - pt has known brain ca.

## 2020-09-10 NOTE — Unmapped (Signed)
[facility]  Emergency Department Provider Note      ED Clinical Impression      Final diagnoses:   Glioblastoma (CMS-HCC) (Primary)   Cerebral edema (CMS-HCC)   Chronic intractable headache, unspecified headache type           Impression, ED Course, Assessment and Plan        ED Course as of Sep 10 2134   Mon Sep 10, 2020   [id number] 34 year old male with recurrent glioblastoma presenting after outpatient MRI showed progression of disease with 2 cm of midline shift and brainstem compression.  Aside from ongoing worsening headache he has no significant new neurologic symptoms.  He has mild past-pointing with the left hand on finger-nose-finger.  Otherwise cranial nerves, strength and sensation in the extremities are intact.  Given progression despite chemotherapy and prior resection with significant midline shift, edema, and mass-effect discussed with neurosurgery who did not think he needed emergent surgical intervention but recommended IV Decadron.  Given radiologic findings we will plan to admit to oncology for further care.           Additional Medical Decision Making     I have reviewed the vital signs and the nursing notes. Labs and radiology results that were available during my care of the patient were independently reviewed by me and considered in my medical decision making.     I independently visualized the radiology images.   I reviewed the patient's prior medical records.  I discussed the case with the neurosurgery consultant.   I discussed the case and plan for continuity of care with the admitting provider.     Portions of this record have been created using Scientist, clinical (histocompatibility and immunogenetics). Dictation errors have been sought, but may not have been identified and corrected.  ____________________________________________         History        Chief Complaint  Headache New Onset or New Symptoms      HPI   Darrell [name] is a 34 y.o. male with a PMH of migraine, anxiety, and glioblastoma who presents with a headache. The patient reports a hx of headaches with associated nausea and vomiting that worsened in the past few days. He notes his nausea and vomiting worsens as his headache worsens. The patient states he was advised to present to the ED after a prior MRI read. He notes he has a scheduled MRI for f/u for his glioblastoma. He has taken percocet and ibuprofen for his sx without relief. He starts a new chemo cycle in a week. The patient has not been on decadron since his last surgery. No recent falls or trauma. Denies visual changes, unsteady gait, issues with coordination, fever, cough, sore throat, or diarrhea.    Per chart review, MRI Brain ordered on 08/14/2020 showed Significant increase in size of the postoperative right temporal lobe glioblastoma as compared to 07/09/2020, with marked increased size of the cystic/necrotic component as well as increased nodular enhancement along its posterior superomedial margin extending along the ependyma of the right lateral ventricle. Associated increased edema and mass effect results in new leftward midline midline shift of 2 cm and compression of the adjacent brainstem. Due to the new midline shift, the patient was promptly referred to the emergency department for clinical evaluation.    Past Medical History:   Diagnosis Date   ??? Cancer (CMS-HCC)    ??? Migraine        Patient Active Problem List  Diagnosis   ??? Glioblastoma (CMS-HCC)   ??? Anxiety   ??? Migraine without aura and without status migrainosus, not intractable       Past Surgical History:   Procedure Laterality Date   ??? PR EXCIS SUPRATENT BRAIN TUMOR Right 06/22/2020    Procedure: CRANIECTOMY; EXC BRAIN TUMOR-SUPRATENTORIAL;  Surgeon: Corlis Leak, MD;  Location: MAIN [facility];  Service: Neurosurgery   ??? PR MICROSURG TECHNIQUES,REQ OPER MICROSCOPE Right 06/22/2020    Procedure: MICROSURGICAL TECHNIQUES, REQUIRING USE OF OPERATING MICROSCOPE (LIST SEPARATELY IN ADDITION TO CODE FOR PRIMARY PROCEDURE);  Surgeon: Corlis Leak, MD;  Location: MAIN OR [facility];  Service: Neurosurgery   ??? PR STEREOTACTIC COMP ASSIST PROC,CRANIAL,INTRADURAL N/A 06/22/2020    Procedure: STEREOTACTIC COMPUTER-ASSISTED (NAVIGATIONAL) PROCEDURE; CRANIAL, INTRADURAL;  Surgeon: Corlis Leak, MD;  Location: MAIN OR [facility];  Service: Neurosurgery         Current Facility-Administered Medications:   ???  [START ON 09/11/2020] dexamethasone (DECADRON) 4 mg/mL injection 6 mg, 6 mg, Intravenous, Q6H SCH, Aggie Hacker, MD    Current Outpatient Medications:   ???  acetaminophen (TYLENOL) 325 MG tablet, Take 650 mg by mouth daily as needed for pain., Disp: , Rfl:   ???  ALPRAZolam (XANAX) 1 MG tablet, Take 1 mg by mouth two (2) times a day as needed. , Disp: , Rfl:   ???  baclofen (LIORESAL) 20 MG tablet, Take 20 mg by mouth Three (3) times a day as needed. , Disp: , Rfl:   ???  butalbital-acetaminophen-caffeine (ESGIC) 50-325-40 mg per tablet, Take 1 tablet by mouth every four (4) hours as needed for pain., Disp: 120 tablet, Rfl: 3  ???  cholecalciferol, vitamin D3-50 mcg, 2,000 unit,, (VITAMIN D3) 50 mcg (2,000 unit) cap, Take 50 mcg by mouth daily., Disp: , Rfl:   ???  dronabinoL (MARINOL) 5 MG capsule, Take 2 capsules (10 mg total) by mouth Two (2) times a day (30 minutes before a meal)., Disp: 120 capsule, Rfl: 3  ???  ibuprofen (ADVIL,MOTRIN) 200 MG tablet, Take 600 mg by mouth daily as needed for pain., Disp: , Rfl:   ???  levETIRAcetam (KEPPRA) 1000 MG tablet, Take 1 tablet (1,000 mg total) by mouth Two (2) times a day., Disp: 180 tablet, Rfl: 3  ???  magnesium oxide (MAG-OX) 400 mg (241.3 mg elemental magnesium) tablet, Take 1 tablet (400 mg total) by mouth daily., Disp: 200 tablet, Rfl: 1  ???  nortriptyline (PAMELOR) 50 MG capsule, Take 2 capsules (100 mg total) by mouth once daily., Disp: 60 capsule, Rfl: 2  ???  ondansetron (ZOFRAN) 8 MG tablet, One tab PO 30 minutes prior chemo and q8h PRN nausea, Disp: 60 tablet, Rfl: 2  ???  ondansetron (ZOFRAN-ODT) 8 MG disintegrating tablet, Dissolve 1 tablet on the tongue 30 minutes before chemo and every 8 hours as needed for nausea., Disp: 60 tablet, Rfl: 10  ???  ostomy supplies (SKIN-PREP SPRAY-ON DRESSING) SprA, Apply to clean dry skin before wearing Optune device., Disp: 118 mL, Rfl: 0  ???  prochlorperazine (COMPAZINE) 10 MG tablet, Take 1 tablet (10 mg total) by mouth Three (3) times a day before meals., Disp: 90 tablet, Rfl: 3  ???  riboflavin, vitamin B2, 100 mg Tab, Take 1 tablet (100 mg total) by mouth daily., Disp: 90 tablet, Rfl: 3  ???  SUMAtriptan (IMITREX) 50 MG tablet, Take 1 tablet (50 mg total) by mouth once as needed for migraine. Use only once daily as needed., Disp: 30  tablet, Rfl: 2  ???  temozolomide (TEMODAR) 180 mg capsule, Take 1 (180mg ) capsule with 1 (250mg ) capsule by mouth for 5 consecutive days every 28 days. Take 30 minutes after Zofran at bedtime. Total dose = 430mg ., Disp: 5 capsule, Rfl: 1  ???  temozolomide (TEMODAR) 250 mg capsule, Take 1 (250mg ) capsule with 1 (180mg ) capsule by mouth for 5 consecutive days every 28 days. Take 30 minutes after Zofran at bedtime. Total dose = 430mg ., Disp: 5 capsule, Rfl: 1  ???  traZODone (DESYREL) 50 MG tablet, TAKE 1/2 TO 1 TABLET(25 TO 50 MG) BY MOUTH EVERY NIGHT AS NEEDED FOR SLEEP, Disp: 30 tablet, Rfl: 1    Allergies  Patient has no known allergies.    Family History   Problem Relation Age of Onset   ??? No Known Problems Mother    ??? No Known Problems Father    ??? No Known Problems Sister    ??? No Known Problems Brother    ??? No Known Problems Maternal Aunt    ??? No Known Problems Maternal Uncle    ??? No Known Problems Paternal Aunt    ??? No Known Problems Paternal Uncle    ??? No Known Problems Maternal Grandmother    ??? No Known Problems Maternal Grandfather    ??? No Known Problems Paternal Grandmother    ??? No Known Problems Paternal Grandfather    ??? No Known Problems Other    ??? Amblyopia Neg Hx    ??? Blindness Neg Hx    ??? Cancer Neg Hx    ??? Cataracts Neg Hx ??? Diabetes Neg Hx    ??? Glaucoma Neg Hx    ??? Hypertension Neg Hx    ??? Macular degeneration Neg Hx    ??? Retinal detachment Neg Hx    ??? Strabismus Neg Hx    ??? Stroke Neg Hx    ??? Thyroid disease Neg Hx        Social History  Social History     Tobacco Use   ??? Smoking status: Current Every Day Smoker     Packs/day: 0.40     Years: 10.00     Pack years: 4.00     Types: Cigarettes   ??? Smokeless tobacco: Never Used   Vaping Use   ??? Vaping Use: Every day   Substance Use Topics   ??? Alcohol use: Yes   ??? Drug use: Never       Review of Systems     Constitutional: Negative for fever.  Eyes: Negative for visual changes.  ENT: Negative for sore throat.  Cardiovascular: Negative for chest pain.  Respiratory: Negative for shortness of breath.  Gastrointestinal: Positive for nausea and vomiting. Negative for abdominal pain or diarrhea.  Genitourinary: Negative for dysuria.   Musculoskeletal: Negative for back pain.  Skin: Negative for rash.  Neurological: Positive for headache. Negative for focal weakness or numbness.     Physical Exam     This provider entered the patient's room: Yes:    ??? If this provider did not enter the room, a comprehensive physical exam was not able to be performed due to increased infection risk to themselves, other providers, staff and other patients), as well as to conserve personal protective equipment (PPE) utilization during the COVID-19 pandemic.    ??? If this provider did enter the patient room, the following was PPE worn: Surgical mask, eye protection and gloves    ED Triage Vitals [09/10/20 1455]  Enc Vitals Group      BP 128/95      Heart Rate 110      SpO2 Pulse       Resp 16      Temp 36.8 ??C (98.2 ??F)      Temp Source Oral      SpO2 100 %      Weight 89 kb (196 lb 3.2 oz)     Constitutional: Alert and oriented. Well appearing and in no acute distress.  Eyes: Conjunctivae are normal.  ENT       Head: Well-healing surgical scar to right temporal region. Normocephalic and atraumatic.       Nose: No congestion.       Mouth/Throat: Mucous membranes are moist.       Neck: No stridor.  Hematological/Lymphatic/Immunilogical: No cervical lymphadenopathy.  Cardiovascular: Normal rate, regular rhythm. Normal and symmetric distal pulses are present in all extremities.  Respiratory: Lungs clear. Normal respiratory effort. Breath sounds are normal.  Gastrointestinal: Soft and nontender. There is no CVA tenderness.  Musculoskeletal: Normal range of motion in all extremities. No lower extremity edema.       Right lower leg: No tenderness or edema.       Left lower leg: No tenderness or edema.  Neurologic: Cranial nerves II-XII intact. Strength 5/5 in bilateral upper and lower extremities. Mild past-pointing on left hand finger nose finger. Negative Romberg. Normal speech and language. No gross focal neurologic deficits are appreciated.  Skin: Skin is warm, dry and intact. No rash noted.  Psychiatric: Mood and affect are normal. Speech and behavior are normal.     EKG     None     Radiology     No orders to display         Procedures     None    ______________________________________________________   Documentation assistance was provided by Leeanne Mannan, Scribe, on September 10, 2020 at 4:58 PM for Burman Freestone, MD.        Documentation assistance was provided by the scribe in my presence.  The documentation recorded by the scribe has been reviewed by me and accurately reflects the services I personally performed.        Othella Boyer, MD  09/10/20 2136

## 2020-09-11 LAB — CBC
HEMATOCRIT: 49.1 % (ref 41.0–53.0)
HEMOGLOBIN: 16.3 g/dL (ref 13.5–17.5)
MEAN CORPUSCULAR HEMOGLOBIN: 33.3 pg (ref 26.0–34.0)
MEAN CORPUSCULAR VOLUME: 100.2 fL — ABNORMAL HIGH (ref 80.0–100.0)
MEAN PLATELET VOLUME: 9.6 fL (ref 7.0–10.0)
PLATELET COUNT: 225 10*9/L (ref 150–440)
RED CELL DISTRIBUTION WIDTH: 12.6 % (ref 12.0–15.0)
WBC ADJUSTED: 4.6 10*9/L (ref 4.5–11.0)

## 2020-09-11 LAB — BASIC METABOLIC PANEL
BLOOD UREA NITROGEN: <5 mg/dL — ABNORMAL LOW (ref 9–23)
CALCIUM: 9.3 mg/dL (ref 8.7–10.4)
CHLORIDE: 107 mmol/L (ref 98–107)
CO2: 27 mmol/L (ref 20.0–31.0)
CREATININE: 0.69 mg/dL
EGFR CKD-EPI AA MALE: >90 mL/min/{1.73_m2} (ref >=60)
GLUCOSE RANDOM: 137 mg/dL (ref 70–179)
POTASSIUM: 4.4 mmol/L (ref 3.4–4.5)
SODIUM: 138 mmol/L (ref 135–145)

## 2020-09-11 LAB — SODIUM: Sodium:SCnc:Pt:Ser/Plas:Qn:: 138

## 2020-09-11 LAB — RED CELL DISTRIBUTION WIDTH: Lab: 12.6

## 2020-09-11 LAB — MAGNESIUM: Magnesium:MCnc:Pt:Ser/Plas:Qn:: 2

## 2020-09-11 MED ADMIN — ALPRAZolam (XANAX) tablet 1 mg: 1 mg | ORAL | @ 06:00:00

## 2020-09-11 MED ADMIN — MORPhine injection 2 mg: 2 mg | INTRAVENOUS | @ 21:00:00 | Stop: 2020-09-11

## 2020-09-11 MED ADMIN — acetaminophen (OFIRMEV) 10 mg/mL injection 650 mg 65 mL: 650 mg | INTRAVENOUS | @ 22:00:00 | Stop: 2020-09-12

## 2020-09-11 MED ADMIN — dronabinoL (MARINOL) capsule 10 mg: 10 mg | ORAL | @ 13:00:00

## 2020-09-11 MED ADMIN — dexamethasone (DECADRON) 4 mg/mL injection 6 mg: 6 mg | INTRAVENOUS | @ 11:00:00 | Stop: 2020-09-11

## 2020-09-11 MED ADMIN — prochlorperazine (COMPAZINE) tablet 10 mg: 10 mg | ORAL | @ 21:00:00

## 2020-09-11 MED ADMIN — oxyCODONE (ROXICODONE) immediate release tablet 10 mg: 10 mg | ORAL | @ 18:00:00 | Stop: 2020-09-25

## 2020-09-11 MED ADMIN — traZODone (DESYREL) tablet 50 mg: 50 mg | ORAL | @ 05:00:00

## 2020-09-11 MED ADMIN — dronabinoL (MARINOL) capsule 10 mg: 10 mg | ORAL | @ 22:00:00

## 2020-09-11 MED ADMIN — prochlorperazine (COMPAZINE) tablet 10 mg: 10 mg | ORAL | @ 13:00:00

## 2020-09-11 MED ADMIN — cholecalciferol (vitamin D3 25 mcg (1,000 units)) tablet 25 mcg: 25 ug | ORAL | @ 13:00:00

## 2020-09-11 MED ADMIN — MORPhine 4 mg/mL injection 4 mg: 4 mg | INTRAVENOUS | @ 02:00:00 | Stop: 2020-09-10

## 2020-09-11 MED ADMIN — dexamethasone (DECADRON) 4 mg/mL injection 4 mg: 4 mg | INTRAVENOUS | @ 22:00:00

## 2020-09-11 MED ADMIN — dexamethasone (DECADRON) 4 mg/mL injection 4 mg: 4 mg | INTRAVENOUS | @ 16:00:00

## 2020-09-11 MED ADMIN — magnesium oxide (MAG-OX) tablet 400 mg: 400 mg | ORAL | @ 13:00:00

## 2020-09-11 NOTE — Unmapped (Signed)
NEUROSURGERY CONSULT NOTE    Requesting Attending Physician:  Maurie Boettcher, MD  Service Requesting Consult:  Oncology/Hematology ([facility]DE)    Assessment and Recommendations  Darrell [name] is a 34 y.o. male PMH significant for migraine, anxiety, GBM s/p multiple resections (03/21/20 at [facility] and 06/23/20) who presents for recurrence. Neurologically he has a reassuring exam. MRI brain shows interval enlargement of his right temporal GBM with increased size of the cystic component. This has caused associated mass effect and edema. At this point, given his reassuring clinical status we would not recommend any acute neurosurgerical intervention. We will plan to discuss with his primary oncology and neurosurgical team in the morning to discuss the role of further surgical intervention.     - No acute neurosurgical intervention  - Steroids per oncology    Patient was discussed with Casimiro Needle P. Ralene Bathe, MD and staffed with Dorthea Cove, MD. Please page the Neurosurgery consult pager at [phone] with questions/concerns.     Problem List  Active Problems:    * No active hospital problems. *  Resolved Problems:    * No resolved hospital problems. *      History of Present Illness  Darrell Dierks is a 34 y.o.male PMH significant for migraine, anxiety, GBM s/p multiple resections (03/21/20 at [facility] and 06/23/20) seen in consultation at the request of Maurie Boettcher, MD for evaluation of recurrence. He has had worsening headaches for approximately 1 week. He presented for a surveillance MRI scan which was concerning for interval growth of his GBM and he was transferred to [facility] oncology service. He neurologically otherwise has been at his baseline. He denies any new weakness, nausea, vomiting, numbness, bowel/bladder dysfunction. He has blurry vision which is not new.     Review of Systems  A 10-system review of systems was conducted and was negative except as documented above in the HPI.  ______________________________________________________________________    Physical Exam  General: No acute distress; There is no height or weight on file to calculate BMI.    Neurological Exam:  Eyes open spontaneously  Oriented to name, location, time  Pupils equal and reactive bilaterally  Extraocular movements intact bilaterally  Face symmetric  Tongue protrudes in the midline  Full shrug bilaterally  RUE 5  LUE 5  RLE 5  LLE 5  Sensation to light touch intact throughout   Names 3/3  Repeats 3/3  Speech fluent  Blurry peripheral vision    Cardiovascular:  Warm and well perfused with good pulses, no edema  Pulmonary:  Unlabored breathing, no pursed lips or wheezing, acyanotic  Abdomen:  Soft, nontender, nondistended    Neurological imaging reviewed  MRI brain - as described above.     The patient's available vitals, intake/output, medications, labs, and relevant neurological imaging were independently reviewed.    ---Historical data---    Allergies  Patient has no known allergies.    Medications  Reviewed in Epic.    Past Medical History  Past Medical History:   Diagnosis Date   ??? Cancer (CMS-HCC)    ??? Migraine        Past Surgical History  Past Surgical History:   Procedure Laterality Date   ??? PR EXCIS SUPRATENT BRAIN TUMOR Right 06/22/2020    Procedure: CRANIECTOMY; EXC BRAIN TUMOR-SUPRATENTORIAL;  Surgeon: Corlis Leak, MD;  Location: MAIN OR [facility];  Service: Neurosurgery   ??? PR MICROSURG TECHNIQUES,REQ OPER MICROSCOPE Right 06/22/2020    Procedure: MICROSURGICAL TECHNIQUES, REQUIRING USE OF OPERATING MICROSCOPE (  LIST SEPARATELY IN ADDITION TO CODE FOR PRIMARY PROCEDURE);  Surgeon: Corlis Leak, MD;  Location: MAIN OR [facility];  Service: Neurosurgery   ??? PR STEREOTACTIC COMP ASSIST PROC,CRANIAL,INTRADURAL N/A 06/22/2020    Procedure: STEREOTACTIC COMPUTER-ASSISTED (NAVIGATIONAL) PROCEDURE; CRANIAL, INTRADURAL;  Surgeon: Corlis Leak, MD;  Location: MAIN OR [facility];  Service: Neurosurgery Family History  Family History   Problem Relation Age of Onset   ??? No Known Problems Mother    ??? No Known Problems Father    ??? No Known Problems Sister    ??? No Known Problems Brother    ??? No Known Problems Maternal Aunt    ??? No Known Problems Maternal Uncle    ??? No Known Problems Paternal Aunt    ??? No Known Problems Paternal Uncle    ??? No Known Problems Maternal Grandmother    ??? No Known Problems Maternal Grandfather    ??? No Known Problems Paternal Grandmother    ??? No Known Problems Paternal Grandfather    ??? No Known Problems Other    ??? Amblyopia Neg Hx    ??? Blindness Neg Hx    ??? Cancer Neg Hx    ??? Cataracts Neg Hx    ??? Diabetes Neg Hx    ??? Glaucoma Neg Hx    ??? Hypertension Neg Hx    ??? Macular degeneration Neg Hx    ??? Retinal detachment Neg Hx    ??? Strabismus Neg Hx    ??? Stroke Neg Hx    ??? Thyroid disease Neg Hx        Social History  Social History     Tobacco Use   ??? Smoking status: Current Every Day Smoker     Packs/day: 0.40     Years: 10.00     Pack years: 4.00     Types: Cigarettes   ??? Smokeless tobacco: Never Used   Vaping Use   ??? Vaping Use: Every day   Substance Use Topics   ??? Alcohol use: Yes   ??? Drug use: Never

## 2020-09-11 NOTE — Unmapped (Signed)
Care Management  Initial Transition Planning Assessment              General  Care Manager assessed the patient by : In person interview with patient, In person interview with family, Medical record review, Discussion with Clinical Care team  Orientation Level: Oriented X4  Functional level prior to admission: Independent  Reason for referral: Discharge Planning    Contact/Decision Maker  Extended Emergency Contact Information  Primary Emergency Contact: Cunliffe,Daniel  Mobile Phone: [phone]  Relation: Brother    Legal Next of Kin / Guardian / POA / Advance Directives     HCDM (patient stated preference): Cathey,Daniel - Brother - [phone]    Advance Directive (Medical Treatment)  Does patient have an advance directive covering medical treatment?: Patient does not have advance directive covering medical treatment. (Pt has forms and wants his brother Grayton Lobo to be HCPOA.  I told him how to contact patient relations to notarize)  Reason patient does not have an advance directive covering medical treatment:: Patient needs follow-up to complete one.    Health Care Decision Maker [HCDM] (Medical & Mental Health Treatment)  Healthcare Decision Maker: HCDM documented in the HCDM/Contact Info section.  Information offered on HCDM, Medical & Mental Health advance directives:: Patient declined information. (Pt states he already has forms)         Patient Information  Lives with: Family members (Lives w/ brother Reuel Boom, and Daniel's girlfriend and two kids)    Type of Residence: Private residence       Type of Residence: Mailing Address:  [address]  [place] [address]  Contacts: Accompanied by: Other (Comment) Tax inspector )  Patient Phone Number:   Telephone Information:   Mobile [phone]             Medical Provider(s): Barnett Hatter, NP  Reason for Admission: Admitting Diagnosis:  Cerebral edema (CMS-HCC) [G93.6]  Glioblastoma (CMS-HCC) [C71.9]  Chronic intractable headache, unspecified headache type [R51.9, G89.29]  Past Medical History:   has a past medical history of Cancer (CMS-HCC) and Migraine.  Past Surgical History:   has a past surgical history that includes pr excis supratent brain tumor (Right, 06/22/2020); pr microsurg techniques,req oper microscope (Right, 06/22/2020); and pr stereotactic comp assist proc,cranial,intradural (N/A, 06/22/2020).   Previous admit date: 06/22/2020    Primary Insurance- Payor: [place] [employer]AS [place] / Plan: [place] [employer] [employer]ID [employer]IT[employer] [place] / Product Type: *No Product type* /   Secondary Insurance ??? None  Prescription Coverage ??? amerihealth medicaid  Preferred Pharmacy - [id number] - [place], [place] - [address] & [facility]  [facility]  [facility] [phone] - [place], [place] - [address] AT [address]  [facility] [phone] - [place], [place] - [address] AT [address]  [facility]    Transportation home: Private vehicle               Support Systems/Concerns: Family Members    Responsibilities/Dependents at home?: No (Pt's two children (72 and 42 year old) are with their mother)    Home Care services in place prior to admission?: No                  Equipment Currently Used at Home: none       Currently  receiving outpatient dialysis?: No       Financial Information       Need for financial assistance?: No (SSDI to start in October.  Living off savings and family support)       Social Determinants of Health  Social Determinants of Health     Tobacco Use: High Risk   ??? Smoking Tobacco Use: Current Every Day Smoker   ??? Smokeless Tobacco Use: Never Used   Alcohol Use:    ??? How often do you have a drink containing alcohol?:    ??? How many drinks containing alcohol do you have on a typical day when you are drinking?:    ??? How often do you have 5 or more drinks on one occasion?:    Financial Resource Strain: Low Risk    ??? Difficulty of Paying Living Expenses: Not hard at all   Food Insecurity: No Food Insecurity   ??? Worried About Programme researcher, broadcasting/film/video in the Last Year: Never true   ??? Ran Out of Food in the Last Year: Never true   Transportation Needs: No Transportation Needs   ??? Lack of Transportation (Medical): No   ??? Lack of Transportation (Non-Medical): No   Physical Activity:    ??? Days of Exercise per Week:    ??? Minutes of Exercise per Session:    Stress:    ??? Feeling of Stress :    Social Connections:    ??? Frequency of Communication with Friends and Family:    ??? Frequency of Social Gatherings with Friends and Family:    ??? Attends Religious Services:    ??? Database administrator or Organizations:    ??? Attends Engineer, structural:    ??? Marital Status:    Intimate Programme researcher, broadcasting/film/video Violence:    ??? Fear of Current or Ex-Partner:    ??? Emotionally Abused:    ??? Physically Abused:    ??? Sexually Abused:    Depression:    ??? PHQ-2 Score:    Housing/Utilities: Low Risk    ??? Within the past 12 months, have you ever stayed: outside, in a car, in a tent, in an overnight shelter, or temporarily in someone else's home (i.e. couch-surfing)?: No   ??? Are you worried about losing your housing?: No   ??? Within the past 12 months, have you been unable to get utilities (heat, electricity) when it was really needed?: No   Substance Use:    ??? Taken prescription drugs for non-medical reasons:    ??? Taken illegal drugs:    ??? Patient indicated they have taken drugs in the past year for non-medical reasons: Yes, [positive answer(s)]:    Health Literacy:    ??? :        Discharge Needs Assessment  Concerns to be Addressed: denies needs/concerns at this time    Clinical Risk Factors: Principal Diagnosis: Cancer, Stroke, COPD, Heart Failure, AMI, Pneumonia, Joint Replacment    Barriers to taking medications: No    Prior overnight hospital stay or ED visit in last 90 days: No    Readmission Within the Last 30 Days: no previous admission in last 30 days         Anticipated Changes Related to Illness: none    Equipment Needed After Discharge: none    Discharge Facility/Level of Care Needs: other (see comments) (home)    Readmission  Risk of Unplanned Readmission Score: UNPLANNED READMISSION SCORE: 13%  Predictive Model Details  13% (Medium)  Factor Value    Calculated 09/11/2020 12:04 21% Number of active Rx orders 35    [place] Risk of Unplanned Readmission Model 17% Active NSAID Rx order present     15% Number of ED visits in last six months 3     9% Diagnosis of cancer present     8% Active antipsychotic Rx order present     8% ECG/EKG order present in last 6 months     6% Imaging order present in last 6 months     4% Number of hospitalizations in last year 1     4% Active corticosteroid Rx order present     3% Age 34     2% Charlson Comorbidity Index 2     2% Future appointment scheduled     1% Current length of stay 0.616 days      Readmitted Within the Last 30 Days? (No if blank)   Patient at risk for readmission?: Yes    Discharge Plan  Screen findings are: Care Manager reviewed the plan of the patient's care with the Multidisciplinary Team. No discharge planning needs identified at this time. Care Manager will continue to manage plan and monitor patient's progress with the team.    Expected Discharge Date: TBD      Quality data for continuing care services shared with patient and/or representative?: N/A  Patient and/or family were provided with choice of facilities / services that are available and appropriate to meet post hospital care needs?: N/A       Initial Assessment complete?: Yes

## 2020-09-11 NOTE — Unmapped (Signed)
Oncology (MEDO) History & Physical    Assessment & Plan:   Darrell [name] is a 34 y.o. 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 Adriana Simas at [facility], s/p resection and cyst drainage on 06/22/20 with Dr. Laqueta Jean, s/p 6000 cGy with Dr. Flonnie Hailstone with concurrent TMZ (initiation 07/10/20) , and HA. He is a transfer from [facility] ED to receive evaluation from NSGY secondary to new midline shifit on MRI, and symptom management.    Principal Problem:    Glioblastoma (CMS-HCC)  Active Problems:    Anxiety    Migraine without aura and without status migrainosus, not intractable    Seizure (CMS-HCC)    Nausea & vomiting  Resolved Problems:    * No resolved hospital problems. *     Right frontotemporal lobe glioblastoma (WHO Grade IV): s/p gross total resection on 03/21/20 by Dr. Adriana Simas at [facility], s/p resection and cyst drainage on 06/22/20 with Dr. Laqueta Jean, s/p 6000 cGy with Dr. 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 Course complicated by post surgical cysts and one seizure. He has progressive HA and nausea last few days. 09/10/2020 showed Significant increase in size of the postoperative right temporal lobe glioblastoma as compared to 07/09/2020, with marked increased size of the cystic/necrotic component as well as increased nodular enhancement along its posterior superomedial margin extending along the ependyma of the right lateral ventricle. Associated increased edema and mass effect results in new leftward midline midline shift of 2 cm and compression of the adjacent brainstem. LUE is 4/5 strength compared to right (patient says it is not new, and he is R handed). CN 2-12 grossly intact. Alert and oriented on exam.   - NSGY consulted, paged, and spoken with overnight. Per resident no need for acute surgical intervention. They plan to discuss with attending and patient's neurosurgeon in the monring  - Decadron 6 q6  - NPO midnight in case of surgical intervention in am  - anti seizure: continue home keppra 1 gram BID  - initiate temodar on schedule when time (patient says it should be some time next week) if still inpatient    HA/Nausea: Worsening over last few days and secondary to above. He takes multiple pain and nausea medications at home, however lately none have been helping. He received morphine at [facility] ED that does not seem to have helped much. He is stoic on exam despite expressing that the HA is quite bad. He recently stopped tylenol because he was told he may have need getting too much with the fiorcet and as a result was having tylenol induced HA.  - continue home nortriptyline 100 daily, baclofen 20 PRN tid  - con't magnesium 400 mg/d and riboflavin 100 mg/d  - hold home ibuprofen in the setting of decadron  - not currently taking tylenol and fiorcet not helping, will hold for now  - continue home compazine, zofran ODT, add IV zofran PRN  - pal care consult for sx management  - consider neuro consult    Appetite: takes marinol for appetite stimulation. Continue medicaiton    Anxiety/sleep: continue home meds  - xanax 1 BID prn  - trazodone 50 at bedtime prn      Daily Checklist:  Diet: NPO and Regular Diet  DVT PPx: scd  Electrolytes: replete as needed  Code Status: Full Code    Chief Concern:   Glioblastoma (CMS-HCC)    Subjective:   HPI:  Darrell [name] is a  34 y.o. 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 Adriana Simas at [facility], s/p resection and cyst drainage on 06/22/20 with Dr. Laqueta Jean, s/p 6000 cGy with Dr. Flonnie Hailstone with concurrent TMZ (initiation 07/10/20) , and HA. He is a transfer from [facility] ED to receive evaluation from NSGY secondary to new midline shifit on MRI, and symptom management    The patient reports a hx of headaches with associated nausea and vomiting that worsened in the past few days. He notes his nausea and vomiting worsens as his headache worsens. The HAs have not stopped despite therapies (fiorcet, nortriptyline, percocet, ibuprofen). He went to get a schedule MRI. Per chart review and discussion with patient: 09/10/2020 showed Significant increase in size of the postoperative right temporal lobe glioblastoma as compared to 07/09/2020, with marked increased size of the cystic/necrotic component as well as increased nodular enhancement along its posterior superomedial margin extending along the ependyma of the right lateral ventricle. Associated increased edema and mass effect results in new leftward midline midline shift of 2 cm and compression of the adjacent brainstem. Due to the new midline shift, the patient was promptly referred to the emergency department for clinical evaluation. He was then transferred from [facility] for NSGY evaluation and symptom management    Upon my evaluation he is still having HA and nausea. He was started on decadron at [facility]. He denies neuro sx (other than that mention including): weakness, loss of sensation, seizure. He has had no fever, chill, night sweats, sick contacts, chest pain, or SOB.     He is here with his brother older brother. He says that his next round of takign TMZ starts next week.    Oncology History Overview Note   Identifying Statement:  Darrell [name] is a 34 y.o. 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 Initially came to medical attention presenting to [facility] ED with a 3 week history of intractable headaches with associated photophobia, blurry vision, and nausea/vomiting.     Molecular Markers:  Ki67: pending  MGMT promotor: unmethylated  IDH: wildtype  TERT promotor: mutated    TEMPUS  Potentially Actionable  EGFRvIII    Biologically Relevant  TERT        c.-124C>T Variant - Promoter mutation          21.5% VAF  EGFR        p.R108K Missense variant (exon 3) ??? GOF    19.0% VAF  CDKN2A    copy number loss  CDKN2B     copy number loss  EGFR          copy number gain  MTAP copy number loss  EGFR EGFRvIVb    Variants of Unknown Significance  NTRK2      c.839A>G p.N280S Missense variant             58.7% VAF  CTNNA      c.591A>G p.E197E Splice region variant       42.1% VAF  PDK1         c.137T>C p.V46A Missense variant               29.4% VAF  EGFR        c.3483_3491del p.[id number]              12.3% VAF  SYNE1      c.852C>A p.D284E Missense variant  11.7% VAF  EGFR        c.2946+372_[id number] Variant                          9.6% VAF    Treatment History:  03/15/20: presented to [facility] ED w/ a 3 week history of intractable HAs. CT Head revealed new large ill-defined 6.8 cm hemorrhagic masslike lesion in the right parietotemporal region with prominent surrounding vasogenic edema and mass effect with 1.0 cm leftward midline shift.   03/16/20: brain-lab guided MRI a [facility] confirmed medial right temporal lesion measuring 5.6 x 4.8 x 3.9 cmHGG with satellite lesions as well as a trapped R temporal horn.   03/21/20: s/p brain-lab guided right temporal craniotomy for resection by Dr. Adriana Simas at [facility]. 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  04/17/20: initial eval w/ [facility] for GBM; KPS 90; MRI w/ GTR; proceed w/ chemoRT; RTC 2-3 weeks post completion of chemoRT w/ MRI  04/30/20-06/12/20: concurrent chemoRT; 6000 cGy total  06/19/20: KPS 90; MRI w/ large cyst; referred to ED for surgical intervention  06/22/20: admission for resection and cyst drainage with Dr. Laqueta Jean  07/09/20: KPS 90; MRI w/ PD; begin main't TMZ, Optune approval pending; RTC in 1 month w/ lab work to confirm tolerance  07/10/20: C1D1 maintenance TMZ  08/14/20: KPS 90; MRI w/ SD; increase TMZ dose for C2; RTC in 1 mos w/ MRI prior           Allergies:  Patient has no known allergies.    Medications:   Prior to Admission medications    Medication Dose, Route, Frequency   acetaminophen (TYLENOL) 325 MG tablet 650 mg, Oral, Daily PRN   ALPRAZolam (XANAX) 1 MG tablet 1 mg, Oral, 2 times a day PRN   baclofen (LIORESAL) 20 MG tablet 20 mg, Oral, 3 times a day PRN   butalbital-acetaminophen-caffeine (ESGIC) 50-325-40 mg per tablet 1 tablet, Oral, Every 4 hours PRN   cholecalciferol, vitamin D3-50 mcg, 2,000 unit,, (VITAMIN D3) 50 mcg (2,000 unit) cap 50 mcg, Oral, Daily   dronabinoL (MARINOL) 5 MG capsule 10 mg, Oral, 2 times a day (AC)   ibuprofen (ADVIL,MOTRIN) 200 MG tablet 600 mg, Oral, Daily PRN   levETIRAcetam (KEPPRA) 1000 MG tablet 1,000 mg, Oral, 2 times a day (standard)   magnesium oxide (MAG-OX) 400 mg (241.3 mg elemental magnesium) tablet 400 mg, Oral, Daily (standard)   nortriptyline (PAMELOR) 50 MG capsule Take 2 capsules (100 mg total) by mouth once daily.   ondansetron (ZOFRAN) 8 MG tablet One tab PO 30 minutes prior chemo and q8h PRN nausea   ondansetron (ZOFRAN-ODT) 8 MG disintegrating tablet Dissolve 1 tablet on the tongue 30 minutes before chemo and every 8 hours as needed for nausea.   ostomy supplies (SKIN-PREP SPRAY-ON DRESSING) SprA Apply to clean dry skin before wearing Optune device.   prochlorperazine (COMPAZINE) 10 MG tablet 10 mg, Oral, 3 times a day (AC)   riboflavin, vitamin B2, 100 mg Tab 100 mg, Oral, Daily (standard)   SUMAtriptan (IMITREX) 50 MG tablet 50 mg, Oral, Once as needed, Use only once daily as needed.   temozolomide (TEMODAR) 180 mg capsule Take 1 (180mg ) capsule with 1 (250mg ) capsule by mouth for 5 consecutive days every 28 days. Take 30 minutes after Zofran at bedtime. Total dose = 430mg .   temozolomide (TEMODAR) 250 mg capsule Take 1 (250mg ) capsule with 1 (180mg ) capsule by mouth for  5 consecutive days every 28 days. Take 30 minutes after Zofran at bedtime. Total dose = 430mg .   traZODone (DESYREL) 50 MG tablet TAKE 1/2 TO 1 TABLET(25 TO 50 MG) BY MOUTH EVERY NIGHT AS NEEDED FOR SLEEP       Medical History:  Past Medical History:   Diagnosis Date   ??? Cancer (CMS-HCC)    ??? Migraine        Surgical History:  Past Surgical History:   Procedure Laterality Date   ??? PR EXCIS SUPRATENT BRAIN TUMOR Right 06/22/2020    Procedure: CRANIECTOMY; EXC BRAIN TUMOR-SUPRATENTORIAL;  Surgeon: Corlis Leak, MD;  Location: MAIN OR [facility];  Service: Neurosurgery   ??? PR MICROSURG TECHNIQUES,REQ OPER MICROSCOPE Right 06/22/2020    Procedure: MICROSURGICAL TECHNIQUES, REQUIRING USE OF OPERATING MICROSCOPE (LIST SEPARATELY IN ADDITION TO CODE FOR PRIMARY PROCEDURE);  Surgeon: Corlis Leak, MD;  Location: MAIN [facility];  Service: Neurosurgery   ??? PR STEREOTACTIC COMP ASSIST PROC,CRANIAL,INTRADURAL N/A 06/22/2020    Procedure: STEREOTACTIC COMPUTER-ASSISTED (NAVIGATIONAL) PROCEDURE; CRANIAL, INTRADURAL;  Surgeon: Corlis Leak, MD;  Location: MAIN OR [facility];  Service: Neurosurgery       Family History:   Family History   Problem Relation Age of Onset   ??? No Known Problems Mother    ??? No Known Problems Father    ??? No Known Problems Sister    ??? No Known Problems Brother    ??? No Known Problems Maternal Aunt    ??? No Known Problems Maternal Uncle    ??? No Known Problems Paternal Aunt    ??? No Known Problems Paternal Uncle    ??? No Known Problems Maternal Grandmother    ??? No Known Problems Maternal Grandfather    ??? No Known Problems Paternal Grandmother    ??? No Known Problems Paternal Grandfather    ??? No Known Problems Other    ??? Amblyopia Neg Hx    ??? Blindness Neg Hx    ??? Cancer Neg Hx    ??? Cataracts Neg Hx    ??? Diabetes Neg Hx    ??? Glaucoma Neg Hx    ??? Hypertension Neg Hx    ??? Macular degeneration Neg Hx    ??? Retinal detachment Neg Hx    ??? Strabismus Neg Hx    ??? Stroke Neg Hx    ??? Thyroid disease Neg Hx        Social History:  The patient lives with family    Social History     Tobacco Use   ??? Smoking status: Current Every Day Smoker     Packs/day: 0.40     Years: 10.00     Pack years: 4.00     Types: Cigarettes   ??? Smokeless tobacco: Never Used   Vaping Use   ??? Vaping Use: Every day   Substance Use Topics   ??? Alcohol use: Yes   ??? Drug use: Never        Review of Systems:  10 systems were reviewed and are negative unless otherwise mentioned in the HPI    Objective:   Physical Exam:  Temp:  [36.8 ??C-36.9 ??C] 36.9 ??C  Heart Rate:  [72-110] 72  SpO2 Pulse:  [87-105] 105  Resp:  [16-20] 18  BP: (116-128)/(83-102) 116/83  SpO2:  [95 %-100 %] 98 %    Gen: WDWN  in NAD, answers questions appropriately  Eyes: Sclera anicteric, EOMI, PERRLA,  HENT: atraumatic, normocephalic, MMM. OP w/o erythema  or exudate   Neck: no cervical lymphadenopathy or thyromegaly, no JVD  Heart: RRR, S1, S2, no M/R/G, no chest wall tenderness  Lungs: CTAB, no crackles or wheezes, no use of accessory muscles  Abdomen: Normoactive bowel sounds, soft, NTND, no rebound/guarding, no hepatosplenomegaly  Extremities: no clubbing, cyanosis, or edema: pulses are +2 in bilateral upper and lower extremities  Neuro: CN II-XI grossly intact. LUE strength 4/5 all others 5/5.   Skin:  No rashes, lesions on clothed exam  Psych: Alert, oriented, normal mood and affect.     Labs/Studies/Imaging:  Labs, Studies, Imaging from the last 24hrs per EMR and personally reviewed

## 2020-09-11 NOTE — Unmapped (Signed)
Oncology (MEDO) Progress Note    Assessment & Plan:   Darrell [name] is a 34 y.o. 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 Adriana Simas at [facility], s/p resection and cyst drainage on 06/22/20 with Dr. Laqueta Jean, s/p 6000 cGy with Dr. 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     Principal Problem:    Glioblastoma (CMS-HCC)  Active Problems:    Anxiety    Migraine without aura and without status migrainosus, not intractable    Seizure (CMS-HCC)    Nausea & vomiting  Resolved Problems:    * No resolved hospital problems. *    Right frontotemporal lobe glioblastoma (WHO Grade IV): s/p gross total resection on 03/21/20 by Dr. Adriana Simas at [facility], s/p resection and cyst drainage on 06/22/20 with Dr. Laqueta Jean, s/p 6000 cGy with Dr. 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 Course complicated by post surgical cysts and one seizure. Now with significant increase in size of the postoperative right temporal lobe glioblastoma w edema and 2 cm midline shift, compression of the adjacent brainstem. Presents with headaches but no focal deficits. Neurosurgery consulted and planning for Ommaya reservoir placement for drainage of cyst on 9/16. In meantime, will continue dexamethasone and AEDs.  -Neurosurgery consulted, planning for cyst drainage 9/16, appreciate further recs  -Continue dexamethasone 4mg  q4  -Continue home keppra 1g BID  -Plan to initiate TMZ next week as scheduled if patient still inpatient    Headaches/nausea  -continue home nortriptyline 100 daily, baclofen 20 PRN tid, oxycodone 5mg /10mg  PRN  -continue??magnesium 400 mg/d and riboflavin 100 mg/d  -continue home compazine, zofran ODT, IV zofran PRN    Appetite: continue home marinol   ??  Anxiety/sleep: continue home xanax 1 BID prn, ttrazodone 50 at bedtime prn    Daily Checklist:  Diet: Regular Diet  DVT PPx: SCDs  Electrolytes: Replete Potassium to >/= 3.6 and Magnesium to >/= 1.8  Code Status: Full Code    Team Contact Information:   Primary Team: Oncology (MEDO)  Primary Resident: Lynann Beaver, MD  Resident's Pager: [phone] (Oncology Intern - Cliffton Asters)    Interval History:   Patient with moderate headache, had been improved somewhat by morphine at [facility]. Quite hungry and eager to eat.     Objective:   Temp:  [36.6 ??C-36.9 ??C] 36.6 ??C  Heart Rate:  [66-110] 66  SpO2 Pulse:  [87-105] 105  Resp:  [16-20] 18  BP: (116-129)/(83-102) 129/83  SpO2:  [95 %-100 %] 99 %    Gen: WDWN in NAD, answers questions appropriately  Eyes: sclera anicteric  HENT: atraumatic, MMM, OP w/o erythema or exudate. Surgical scar noted.  Heart: RRR, S1, S2, no M/R/G, no chest wall tenderness  Lungs: CTAB, no crackles or wheezes, no use of accessory muscles  Abdomen: Normoactive bowel sounds, soft, NTND, no rebound/guarding  Extremities: no clubbing, cyanosis, or edema in the BLEs  Psych: Alert, oriented, appropriate mood and affect  Neuro: CN II-XII intact, 5/5 strength UEs + LEs bilaterally, nl sensation    Labs/Studies: Labs and Studies from the last 24hrs per EMR and Reviewed

## 2020-09-11 NOTE — Unmapped (Signed)
Problem: Adult Inpatient Plan of Care  Goal: Plan of Care Review  Outcome: Progressing  Goal: Patient-Specific Goal (Individualized)  Outcome: Progressing  Goal: Absence of Hospital-Acquired Illness or Injury  Outcome: Progressing  Goal: Optimal Comfort and Wellbeing  Outcome: Progressing  Goal: Readiness for Transition of Care  Outcome: Progressing  Goal: Rounds/Family Conference  Outcome: Progressing

## 2020-09-11 NOTE — Unmapped (Addendum)
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 Adriana Simas at [facility],??s/p resection and cyst drainage on 06/22/20 with Dr. Ihor Dow 6000 cGy with Dr. 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 He had presented for a scheduled outpatient MRI which resulting in the findings above, for which he was referred to [facility] ED. On presentation, he had no focal deficits on neuro exam and no symptoms apart from persistent headache. He was started on corticosteroid treatment for reduction of cerebral edema. Neurosurgery was consulted and recommended placement of Ommaya drain for drainage of GBM-associated cyst. This was placed on 9/16 without complication. Patient was discharged home on *** on steroids with plan to follow up with Dr. Dan Europe of [facility] as an outpatient.    Medication changes  -Patient started on dexamethasone 8mg  BID for cerebral edema

## 2020-09-11 NOTE — Unmapped (Signed)
Spoke with [place].    He called asking if I could come to show him the MRI scans.  He was curious how much his tumor/cyst have grown.    I am not able to come back to [facility] today, but we were able to discuss report:    FINDINGS:    Previously noted right temporal lobe glioblastoma has increased in size since last exam dated 07/09/2020, primarily of the large cystic component. On the preceding exam the lesion measured 5.8 cm x 7.1 cm x 4.1 cm. On today's exam lesion measures 7.3 cm x 8.7 cm x 4.9 cm. The lesion once again shows enhancing rim with internal contents being cystic/necrotic, and again is likely contiguous with the right lateral ventricle. The enhancing rim appears to be thickened along posterior superior-medial aspect, which appears increased from prior exam (for example 17:127 and 102:55). As seen previously this extends along the ependymal surface of the right lateral ventricle. Additional focal thickened rim enhancement along the anterior temporal component (17:93), appears displaced due to the increased size of the cystic component, but not significantly enlarged from prior.   ??  Old hemorrhage/hemosiderin is seen in the rim of tissue surrounding this lesion. Significant edema/abnormal signal is seen in the right cerebral hemisphere and has substantially increased since preceding exam. Edema as well as the lesion produces significant indentation and compression on the adjacent right lateral ventricle and brainstem with leftward midline shift of 2 cm. This also is slightly increased prominence of the left atrium. This was not seen on preceding exam.  ??  No additional lesions are seen.  ??  IMPRESSION:  Significant increase in size of the postoperative right temporal lobe glioblastoma as compared to 07/09/2020, with marked increased size of the cystic/necrotic component as well as increased nodular enhancement along its posterior superomedial margin extending along the ependyma of the right lateral ventricle. Associated increased edema and mass effect results in new leftward midline midline shift of 2 cm and compression of the adjacent brainstem.  ??    I offered emotional support.  [place] seems to be doing well with the information.  I was able to see the SRN is planning on Ommaya reservoir.  Someone should be coming soon to discuss procedure and have consent forms signed.    We discussed coming back to clinic to see Dr. Dan Europe once discharged for further plan of care.    Patient is encouraged to mychart if he has any questions or concerns.

## 2020-09-11 NOTE — Unmapped (Signed)
NEUROSURGERY CONSULT PROGRESS NOTE    Requesting Service Attending Physician:  Fritz Pickerel  Neurosurgery Attending of Record: Marylouise Stacks, MD     Brief History of Present Illness  Darrell [name] is a 34 y.o. male PMH significant for migraine, anxiety, GBM s/p multiple resections (03/21/20 at [facility] and 06/23/20) who presented for recurrence. Neurologically he has a reassuring exam. MRI brain shows interval enlargement of his right temporal GBM with increased size of the cystic component. This has caused associated mass effect and edema.    Subjective/Interval History  Patient reports persistent severe headache but otherwise stable. We discussed our plan for Ommaya placement for cyst drainage and patient was agreeable and decided to proceed with the procedure.     Interval imaging reviewed  N/a    Assessment and Recommendations  **Right temporal GBM, recurrent   - Plan to take to OR 9/16 for Ommaya reservoir placement for interval drainage of cyst  - Please pre-op patient 9/15 (NPO at midnight, hold DVT prophylaxis after last dose 9/15, Please obtain AM labs day of surgery: CBC, BMP, Type and screen, PT/INR, aPTT)  - Steroids per oncology    Please page the Neurosurgery consult pager at [phone] with questions/concerns.   ___________________________________________________________________    Neurological Exam  EOspont   Awake and alert   Ox3   PERRL   EOMI  FS  TML  5/5 strength x 4   No PND  SILT   Names 3/3  Repeats 3/3  Recalls 3/3  Speech fluent    Problem List  Principal Problem:    Glioblastoma (CMS-HCC)  Active Problems:    Anxiety    Migraine without aura and without status migrainosus, not intractable    Seizure (CMS-HCC)    Nausea & vomiting  Resolved Problems:    * No resolved hospital problems. *

## 2020-09-12 LAB — BASIC METABOLIC PANEL
ANION GAP: 6 mmol/L (ref 5–14)
BLOOD UREA NITROGEN: 6 mg/dL — ABNORMAL LOW (ref 9–23)
BUN / CREAT RATIO: 9
CALCIUM: 9.1 mg/dL (ref 8.7–10.4)
CO2: 28 mmol/L (ref 20.0–31.0)
CREATININE: 0.67 mg/dL
EGFR CKD-EPI AA MALE: >90 mL/min/{1.73_m2} (ref >=60)
GLUCOSE RANDOM: 142 mg/dL (ref 70–179)
POTASSIUM: 4.2 mmol/L (ref 3.4–4.5)
SODIUM: 142 mmol/L (ref 135–145)

## 2020-09-12 LAB — CBC
HEMATOCRIT: 46.2 % (ref 41.0–53.0)
MEAN CORPUSCULAR HEMOGLOBIN CONC: 33.5 g/dL (ref 31.0–37.0)
MEAN CORPUSCULAR HEMOGLOBIN: 33.2 pg (ref 26.0–34.0)
MEAN CORPUSCULAR VOLUME: 99.3 fL (ref 80.0–100.0)
MEAN PLATELET VOLUME: 9.7 fL (ref 7.0–10.0)
PLATELET COUNT: 255 10*9/L (ref 150–440)
RED BLOOD CELL COUNT: 4.66 10*12/L (ref 4.50–5.90)
RED CELL DISTRIBUTION WIDTH: 12.5 % (ref 12.0–15.0)
WBC ADJUSTED: 14.9 10*9/L — ABNORMAL HIGH (ref 4.5–11.0)

## 2020-09-12 LAB — MAGNESIUM: Magnesium:MCnc:Pt:Ser/Plas:Qn:: 2

## 2020-09-12 LAB — HEMOGLOBIN: Hemoglobin:MCnc:Pt:Bld:Qn:: 15.5

## 2020-09-12 LAB — SODIUM: Sodium:SCnc:Pt:Ser/Plas:Qn:: 142

## 2020-09-12 MED ADMIN — oxyCODONE (ROXICODONE) immediate release tablet 10 mg: 10 mg | ORAL | @ 04:00:00 | Stop: 2020-09-25

## 2020-09-12 MED ADMIN — acetaminophen (OFIRMEV) 10 mg/mL injection 650 mg 65 mL: 650 mg | INTRAVENOUS | @ 11:00:00 | Stop: 2020-09-12

## 2020-09-12 MED ADMIN — oxyCODONE (ROXICODONE) immediate release tablet 10 mg: 10 mg | ORAL | @ 13:00:00 | Stop: 2020-09-25

## 2020-09-12 MED ADMIN — MORPhine 4 mg/mL injection 4 mg: 4 mg | INTRAVENOUS | @ 03:00:00 | Stop: 2020-09-25

## 2020-09-12 MED ADMIN — dexamethasone (DECADRON) 4 mg/mL injection 4 mg: 4 mg | INTRAVENOUS | @ 23:00:00

## 2020-09-12 MED ADMIN — oxyCODONE (ROXICODONE) immediate release tablet 10 mg: 10 mg | ORAL | @ 21:00:00 | Stop: 2020-09-25

## 2020-09-12 MED ADMIN — acetaminophen (OFIRMEV) 10 mg/mL injection 650 mg 65 mL: 650 mg | INTRAVENOUS | @ 15:00:00 | Stop: 2020-09-12

## 2020-09-12 MED ADMIN — dronabinoL (MARINOL) capsule 10 mg: 10 mg | ORAL | @ 21:00:00

## 2020-09-12 MED ADMIN — oxyCODONE (ROXICODONE) immediate release tablet 10 mg: 10 mg | ORAL | @ 10:00:00 | Stop: 2020-09-25

## 2020-09-12 MED ADMIN — MORPhine 4 mg/mL injection 4 mg: 4 mg | INTRAVENOUS | @ 15:00:00

## 2020-09-12 MED ADMIN — levETIRAcetam (KEPPRA) tablet 1,000 mg: 1000 mg | ORAL | @ 13:00:00

## 2020-09-12 MED ADMIN — magnesium oxide (MAG-OX) tablet 400 mg: 400 mg | ORAL | @ 13:00:00

## 2020-09-12 NOTE — Unmapped (Signed)
Pt plan of care discussed, verbalizes understanding. Pain management effective, no adverse reactions noted. OOB ad lib, instructed NPO after midnight for OR tomorrow.   Problem: Adult Inpatient Plan of Care  Goal: Plan of Care Review  Outcome: Progressing  Goal: Patient-Specific Goal (Individualized)  Outcome: Progressing  Flowsheets (Taken 09/12/2020 1338)  Patient-Specific Goals (Include Timeframe): Pt will have adequate pain control with no adverse reactions noted by 9/15  Goal: Absence of Hospital-Acquired Illness or Injury  Outcome: Progressing  Intervention: Identify and Manage Fall Risk  Recent Flowsheet Documentation  Taken 09/12/2020 0800 by Jaye Beagle, RN  Safety Interventions:   family at bedside   lighting adjusted for tasks/safety   low bed   nonskid shoes/slippers when out of bed  Intervention: Prevent and Manage VTE (Venous Thromboembolism) Risk  Recent Flowsheet Documentation  Taken 09/12/2020 0800 by Jaye Beagle, RN  Activity Management: up ad lib  Goal: Optimal Comfort and Wellbeing  Outcome: Progressing

## 2020-09-12 NOTE — Unmapped (Cosign Needed)
Palliative Care Consult Note    Consultation from Requesting Attending Physician:  Maurie Boettcher, MD  Service Requesting Consult:  Oncology/Hematology (MDE)  Reason for Consult Request from Attending Physician:  Evaluation of Symptoms and Patient and Family Support  Primary Care Provider:  Barnett Hatter, NP      Assessment/Plan:      SUMMARY:  This 34 y.o. 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    Symptom Assessment and Recommendations:    1.  Persistent headaches dated cerebral edema from GBM with enlarging cystic component postoperatively  Assessment: Patient endorses daily headaches, with recent worsening over the past few days.  He states that the headache does not seem to be helped by Fioricet anymore, but the opioids seem to help a little as the headache worsens in the afternoons.  He will be going to the OR with neurosurgery on 9/16 for reservoir placement to help drain some of the cystic component.  Plan:  -Recommend starting scheduled IV Tylenol  -Agree with having oxycodone and morphine available for severe headaches or pain  -Hopeful that patient's headaches will improve after his surgery on Thursday, at which point his pain medications may be able to be weaned    2.  Nausea and vomiting  Assessment: Patient states that his nausea is under good control at this time with the scheduled Compazine before meals.  He also endorses relief when he has breakthrough nausea from Zofran ODT.  Plan: Agree with primary team's plan of scheduled Compazine with as needed Zofran ODT    3.  Anxiety  Assessment: Patient reports that he has been taking Xanax for the past 3 years for anxiety, and he usually takes 1 mg once per day before bed, but occasionally takes it twice per day.  Concerned that Xanax may not be helpful for his anxiety in the long run.  Plan:  -Patient agreeable to trying Ativan for his anxiety (and stopping Xanax), as it may benefit his nausea as well; could try Ativan 1mg  at bedtime PRN    Prognosis and Understanding:     Prognosis: Difficult to say, as the neurosurgeon noted that he only has a small amount of tumor recurrence around the cystic component seen on MRI.  He is continuing to receive chemotherapy as well    Family prognostic understanding: Patient was having a discussion with the AYA team when I entered the room today.  He had been telling them that he understands that his GBM will likely be the cause of his death.  Patient states that he would want to know if and when there are no further treatment options, as he would want to know how to plan his remaining time.    Goals of Care and Decision Making:     Decisional capacity at time of visit: Patient has capacity    Decision Maker if lacks capacity: Name:  stated patient preference,   HCDM (patient stated preference): Labrie,Daniel - Brother [phone]    Advance Directive: no    Code status:   Code Status: Full Code     Goals of care: Patient is hopeful that this neurosurgery procedure of the reservoir placement will help his headaches and nausea, and will hopefully prevent him from having to have further operations.  He wants to  continue receiving any treatments available for his cancer, noting that he wants to be present in his children's lives (38-year-old son and 20-year-old daughter).    Practical, Emotional, Spiritual Support / Other Communication and Counseling:    Palliative care visit today included focused interview, active listening, therapeutic use of silence, offering support, sharing empathy and summarization of today's discussion.    Thank you for this consult. Please page Payton Doughty, MD or Palliative Care [phone]) if there are any questions.       Subjective: HPI: Patient is a 34 year old male with right frontotemporal lobe glioblastoma (status post resection in March 2021 at [facility] and June 2021 at [facility]) with recurrence and enlargement of the cystic component, anxiety, and chronic headaches who presented to [facility] ER from radiology after his MRI showed enlargement of the cystic component of his GBM.  He was evaluated by neurosurgery, and they are planning to take him to the OR on 9/16 for Ommaya reservoir placement for interval drainage of the cyst.  Patient reports that he has had acute worsening of his headaches, as well as his nausea and vomiting over the past few days prior to admission.    Symptom Severity and Assessment:   Pain severity and assessment: Patient's headache pain is rated as 8/10, and he describes it as sharp, intense pain in the front of his head.  He wakes up in the mornings with headache, but he describes it as dull at that time, and it usually responds to ibuprofen at that time.  He states that the headaches have been getting worse throughout the afternoon and get worse until he falls asleep.  He states that Fioricet has not helped his headaches at all.  He has been taking Tylenol every few days as well, which he states seemed to help the headaches some like the ibuprofen.  Shortness of Breath: Denies any at this time  Nausea/Vomiting: Denies any nausea or vomiting at this time, stating that he feels like the Compazine has been helping since being admitted.  He states that the dissolvable Zofran helps relieve his nausea at home.  He states that he has been taking Marinol 3 times a day to help with his appetite, and he states that is really been helping over the past few months.  Constipation: Denies any constipation, endorsing daily bowel movements.  He has senna and MiraLAX at home to take as needed for any constipation.  Anxiety: Patient states that he has had anxiety over the past 3 years, for which she is taken Xanax 1 mg twice daily.  He states that his ex used to take some of his Xanax without him knowing it, but he states that he usually only takes it once a day before bed.  Depression: Denies any, stating he tries to be positive person.  He offered himself as a resource for any other patients that may need support to the AYA team  Other: He denies any dizziness or falls at home, and he states that his only had one seizure in the past after his initial surgery, for which she takes Keppra.      Allergies:  No Known Allergies    Medications:  Scheduled Meds:  ??? acetaminophen  650 mg Intravenous Q6H   ??? cholecalciferol (vitamin D3 25 mcg (1,000 units))  25 mcg Oral Daily   ??? dexamethasone  4 mg Intravenous Q6H SCH   ??? dronabinoL  10 mg Oral BID AC   ??? levETIRAcetam  1,000 mg Oral  BID   ??? magnesium oxide  400 mg Oral Daily   ??? nortriptyline  100 mg Oral QAM AC   ??? prochlorperazine  10 mg Oral TID AC   ??? riboflavin (vitamin B2)  100 mg Oral Daily     Continuous Infusions:  PRN Meds:.ALPRAZolam, baclofen, guaiFENesin, MORPhine injection, ondansetron, ondansetron, oxyCODONE **OR** oxyCODONE, traZODone     Past Medical History:   Diagnosis Date   ??? Cancer (CMS-HCC)    ??? Migraine        Past Surgical History:   Procedure Laterality Date   ??? PR EXCIS SUPRATENT BRAIN TUMOR Right 06/22/2020    Procedure: CRANIECTOMY; EXC BRAIN TUMOR-SUPRATENTORIAL;  Surgeon: Corlis Leak, MD;  Location: MAIN OR [facility];  Service: Neurosurgery   ??? PR MICROSURG TECHNIQUES,REQ OPER MICROSCOPE Right 06/22/2020    Procedure: MICROSURGICAL TECHNIQUES, REQUIRING USE OF OPERATING MICROSCOPE (LIST SEPARATELY IN ADDITION TO CODE FOR PRIMARY PROCEDURE);  Surgeon: Corlis Leak, MD;  Location: MAIN OR [facility];  Service: Neurosurgery   ??? PR STEREOTACTIC COMP ASSIST PROC,CRANIAL,INTRADURAL N/A 06/22/2020    Procedure: STEREOTACTIC COMPUTER-ASSISTED (NAVIGATIONAL) PROCEDURE; CRANIAL, INTRADURAL;  Surgeon: Corlis Leak, MD;  Location: MAIN OR [facility];  Service: Neurosurgery       Social History: Lives in [place] with his younger brother.  He is very close with his brother, and he is his Environmental health practitioner.  His children live about an hour away from him with his headaches in [place], and he shared pictures of his 13-year-old son and 60-year-old daughter.  He states that he does not have a good relationship with his ex, and he is working on getting a Clinical research associate to be able to see them more.  He notes that religion is not important to him, stating that many people have made odd comments about God in relation to his cancer, noting that comments like God has a plan for everything is difficult to hear, saying that it is pretty messed up if God wants me to tell my 57-year-old son that I am dying.  He states that he has a very close group of friends from college, and the 5 of them have a group text that is active daily.  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  He also likes to watch cooking shows on TV, and he is trying to help his brother get better with cooking.    Family History:    family history includes No Known Problems in his brother, father, maternal aunt, maternal grandfather, maternal grandmother, maternal uncle, mother, paternal aunt, paternal grandfather, paternal grandmother, paternal uncle, sister, and another family member.    Review of Systems:  A 12 system review of systems was negative except as noted in HPI.      Objective:       Function:  90% - Ambulation: Full / Normal Activity, some evidence of disease / Self-Care:Full / Intake: Normal / Level of Conscious: Full    Temp:  [36.6 ??C-37.2 ??C] 37.2 ??C  Heart Rate:  [66-109] 94  SpO2 Pulse:  [87-105] 105  Resp:  [18-20] 18  BP: (116-129)/(2-102) 120/2  SpO2:  [95 %-99 %] 99 %    No intake/output data recorded.    Physical Exam:  Constitutional: White male sitting up in bed, well-healed surgical scar over the right side of head, no acute distress  Eyes: anicteric sclera, no discharge, wearing corrective lenses  ENMT: Moist oral mucosa, dental hygiene normal  Pulm: Clear to auscultation bilaterally over anterior lung fields, no wheezes, nonlabored respirations  CV: Regular rate and rhythm, no murmurs  Abd: Soft, nondistended, nontender,+ bowel sounds  Ext: No lower extremity edema, 2+ distal pulses  Skin: Warm, dry  Neuro: Alert and oriented to self and situation, moves all extremities spontaneously  Psych: mood and affect appropriate    Test Results:  Lab Results   Component Value Date    WBC 4.6 09/11/2020    RBC 4.90 09/11/2020    HGB 16.3 09/11/2020    HCT 49.1 09/11/2020    MCV 100.2 (H) 09/11/2020    MCH 33.3 09/11/2020    MCHC 33.3 09/11/2020    RDW 12.6 09/11/2020    PLT 225 09/11/2020    MPV 9.6 09/11/2020     Lab Results   Component Value Date    NA 138 09/11/2020    K 4.4 09/11/2020    CL 107 09/11/2020    CO2 27.0 09/11/2020    BUN <5 (L) 09/11/2020    CREATININE 0.69 09/11/2020    GLU 137 09/11/2020    CALCIUM 9.3 09/11/2020    ALBUMIN 3.5 08/14/2020    PHOS 3.7 06/22/2020      Lab Results   Component Value Date    ALKPHOS 52 08/14/2020    BILITOT 1.0 08/14/2020    BILIDIR 0.40 03/15/2020    PROT 6.4 08/14/2020    ALBUMIN 3.5 08/14/2020    ALT 18 08/14/2020    AST 14 08/14/2020       Imaging: reviewed in Epic    This note was created using Dragon dictation software and while every attempt has been made to minimize errors, some may still be present.     Total time spent for evaluation & management (excluding ACP documented separately): 1 Hour    Start time - stop time:    Greater than 50% of this time spent on counseling/coordination of care:  Yes.   See ACP Note from today for additional billable service:  No.     Eula Jaster K. Audria Nine, MD  Fellow, [facility] & Palliative Medicine

## 2020-09-12 NOTE — Unmapped (Signed)
NEUROSURGERY CONSULT PROGRESS NOTE    Requesting Service Attending Physician:  Fritz Pickerel  Neurosurgery Attending of Record: Marylouise Stacks, MD     Brief History of Present Illness  Mali Cuoco is a 34 y.o. male PMH significant for migraine, anxiety, GBM s/p multiple resections (03/21/20 at [facility] and 06/23/20) who presented for recurrence. Neurologically he has a reassuring exam. MRI brain shows interval enlargement of his right temporal GBM with increased size of the cystic component. This has caused associated mass effect and edema.    Subjective/Interval History  Treatment Plan Only    Interval imaging reviewed  N/a    Assessment and Recommendations  **Right temporal GBM, recurrent   - Plan to take to OR Thursday 9/16 for Ommaya reservoir placement for interval drainage of cyst  - Please pre-op patient 9/15 (NPO at midnight, hold DVT prophylaxis after last dose 9/15, Please obtain AM labs day of surgery: CBC, BMP, Type and screen, PT/INR, aPTT). Patient needs a negative COVID test on file for OR.  - Steroids per oncology    Please page the Neurosurgery consult pager at [phone] with questions/concerns.   ___________________________________________________________________    Neurological Exam  EOspont   Treatment Note Only    Problem List  Principal Problem:    Glioblastoma (CMS-HCC)  Active Problems:    Anxiety    Migraine without aura and without status migrainosus, not intractable    Seizure (CMS-HCC)    Nausea & vomiting  Resolved Problems:    * No resolved hospital problems. *

## 2020-09-12 NOTE — Unmapped (Signed)
VSS. PRN oxycodone and morphine given for headache overnight. PRN xanax and zofran administered. CT head completed overnight. Awaiting VRE swab. Family at bedside. Ctm.     Problem: Adult Inpatient Plan of Care  Goal: Plan of Care Review  Outcome: Ongoing - Unchanged  Goal: Patient-Specific Goal (Individualized)  Outcome: Ongoing - Unchanged  Goal: Absence of Hospital-Acquired Illness or Injury  Outcome: Ongoing - Unchanged  Intervention: Identify and Manage Fall Risk  Recent Flowsheet Documentation  Taken 09/11/2020 1915 by Olga Coaster, RN  Safety Interventions:   commode/urinal/bedpan at bedside   environmental modification   fall reduction program maintained   infection management   family at bedside   low bed   lighting adjusted for tasks/safety   muscle strengthening facilitated   nonskid shoes/slippers when out of bed  Intervention: Prevent Infection  Recent Flowsheet Documentation  Taken 09/11/2020 1915 by Olga Coaster, RN  Infection Prevention:   environmental surveillance performed   equipment surfaces disinfected   hand hygiene promoted   personal protective equipment utilized   rest/sleep promoted   single patient room provided   visitors restricted/screened  Goal: Optimal Comfort and Wellbeing  Outcome: Ongoing - Unchanged  Goal: Readiness for Transition of Care  Outcome: Ongoing - Unchanged  Goal: Rounds/Family Conference  Outcome: Ongoing - Unchanged

## 2020-09-12 NOTE — Unmapped (Cosign Needed)
Palliative Care Follow-Up Note    Consultation from Requesting Attending Physician:  Maurie Boettcher, MD  Service Requesting Consult:  Oncology/Hematology ([facility])  Reason for Consult Request from Attending Physician:  Evaluation of Symptoms and Patient and Family Support  Primary Care Provider:  Barnett Hatter, NP      Assessment/Plan:      SUMMARY:  This 34 y.o. 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    Symptom Assessment and Recommendations:    1.  Persistent headaches due to cerebral edema from GBM with enlarging cystic component postoperatively  Assessment: Patient endorses daily headaches, with recent worsening over the past few days. He will be going to the OR with neurosurgery on 9/16 for reservoir placement to help drain some of the cystic component.  He reports mild relief from oxycodone and morphine, but never gets complete relief.   Plan:  -Recommend continuing scheduled IV Tylenol until tomorrow  -Agree with having oxycodone 10 mg and morphine 4 mg IV available for severe headaches or pain, as he gets longer lasting relief from the oxycodone, but the morphine helps more so in regards to severity  -Hopeful that patient's headaches will improve after his surgery on Thursday, at which point his pain medications may be able to be weaned    2.  Nausea and vomiting  Assessment: Patient states that his nausea is under good control at this time with the scheduled Compazine before meals.  He also endorses relief when he has breakthrough nausea from Zofran ODT.  Plan:   -Agree with continuing scheduled Compazine before meals  -Recommend stopping Zofran, as it can worsen constipation while on opioids      3.  Anxiety/insomnia  Assessment: Patient reports that he has been taking Xanax for the past 3 years for anxiety, and he usually takes 1 mg once per day before bed, but occasionally takes it twice per day.  Concerned that Xanax may not be helpful for his anxiety in the long run, and patient agreed to trying Ativan, as it may benefit his anxiety and nausea.  He also takes trazodone before bed, but he continues to have trouble sleeping due to the steroids.  Plan:  -Recommend continuing Ativan 1mg  at bedtime PRN  -Recommend adjusting Decadron to 8 mg every 12 hours (so that he no longer receives the middle of the night dose that disrupts sleep)    Prognosis and Understanding:     Prognosis: Difficult to say, as the neurosurgeon noted that he only has a small amount of tumor recurrence around the cystic component seen on MRI.  He is continuing to receive chemotherapy as well.    Family prognostic understanding: Patient has expressed understanding that his GBM will likely be the cause of his death.  Patient has voiced that he would want to know if and when there are no further treatment options, as he would want to know how to plan his remaining time.    Goals of Care and Decision Making:     Decisional capacity at time of visit: Patient has capacity    Decision Maker if lacks capacity: Name:  stated patient preference,   HCDM (patient stated preference): Melchor,Daniel - Brother [phone]    Advance Directive: no    Code status:   Code Status: Full  Code     Goals of care: Patient is hopeful that this neurosurgery procedure of the reservoir placement will help his headaches and nausea, and will hopefully prevent him from having to have further operations.  He wants to continue receiving any treatments available for his cancer, noting that he wants to be present in his children's lives (21-year-old son and 34-year-old daughter).    Practical, Emotional, Spiritual Support / Other Communication and Counseling:    Palliative care visit today included focused interview, active listening, therapeutic use of silence, offering support, sharing empathy and summarization of today's discussion.    Thank you for this consult. Please page Payton Doughty, MD or Palliative Care [phone]) if there are any questions.       Subjective:     Patient summary: Patient is a 34 year old male with right frontotemporal lobe glioblastoma (status post resection in March 2021 at [facility] and June 2021 at [facility]) with recurrence and enlargement of the cystic component, anxiety, and chronic headaches who presented to [facility] ER from radiology after his MRI showed enlargement of the cystic component of his GBM.  He was evaluated by neurosurgery, and they are planning to take him to the OR on 9/16 for Ommaya reservoir placement for interval drainage of the cyst.  Patient reports that he has had acute worsening of his headaches, as well as his nausea and vomiting over the past few days prior to admission.    Interval events: Patient states that he does not notice much of a difference since starting the IV Tylenol yesterday.  Nursing reported his pain scores as being 5-6 out of 10 (as compared to 7-8 out of 10 the day prior).  He has received a total of 8 mg of morphine, and 5 doses of oxycodone 10 mg for his pain.  He reports that the morphine seems to help some, but it wears off after 30 minutes or so.  He notes longer lasting relief from the oxycodone, but he does some think he gets as much relief as the morphine.  He endorses having a bowel movement since yesterday.  He states that he has had trouble sleeping, commenting that the steroids he received in the middle of the night keep him up.    Allergies:  No Known Allergies    Medications:  Scheduled Meds:  ??? cholecalciferol (vitamin D3 25 mcg (1,000 units))  25 mcg Oral Daily   ??? dexamethasone  4 mg Intravenous Q6H SCH   ??? dronabinoL  10 mg Oral TID AC   ??? levETIRAcetam  1,000 mg Oral BID   ??? magnesium oxide  400 mg Oral Daily   ??? nortriptyline  100 mg Oral QAM AC ??? prochlorperazine  10 mg Oral TID AC   ??? riboflavin (vitamin B2)  100 mg Oral Daily     Continuous Infusions:  PRN Meds:.baclofen, guaiFENesin, LORazepam, MORPhine injection, ondansetron, ondansetron, oxyCODONE **OR** oxyCODONE, traZODone     Past Medical History:   Diagnosis Date   ??? Cancer (CMS-HCC)    ??? Migraine        Past Surgical History:   Procedure Laterality Date   ??? PR EXCIS SUPRATENT BRAIN TUMOR Right 06/22/2020    Procedure: CRANIECTOMY; EXC BRAIN TUMOR-SUPRATENTORIAL;  Surgeon: Corlis Leak, MD;  Location: MAIN OR [facility];  Service: Neurosurgery   ??? PR MICROSURG TECHNIQUES,REQ OPER MICROSCOPE Right 06/22/2020    Procedure: MICROSURGICAL TECHNIQUES, REQUIRING USE OF OPERATING MICROSCOPE (LIST SEPARATELY IN ADDITION TO CODE FOR PRIMARY PROCEDURE);  Surgeon:  Corlis Leak, MD;  Location: MAIN OR [facility];  Service: Neurosurgery   ??? PR STEREOTACTIC COMP ASSIST PROC,CRANIAL,INTRADURAL N/A 06/22/2020    Procedure: STEREOTACTIC COMPUTER-ASSISTED (NAVIGATIONAL) PROCEDURE; CRANIAL, INTRADURAL;  Surgeon: Corlis Leak, MD;  Location: MAIN OR [facility];  Service: Neurosurgery       Objective:       Function:  90% - Ambulation: Full / Normal Activity, some evidence of disease / Self-Care:Full / Intake: Normal / Level of Conscious: Full    Temp:  [36.4 ??C-37.2 ??C] 36.7 ??C  Heart Rate:  [74-94] 76  Resp:  [17-18] 18  BP: (97-133)/(2-90) 127/75  SpO2:  [95 %-100 %] 100 %    I/O this shift:  In: 685 [P.O.:620; IV Piggyback:65]  Out: -     Physical Exam:  Constitutional: White male laying in bed, well-healed surgical scar over the right side of head, no acute distress  Eyes: anicteric sclera, no discharge, wearing corrective lenses  ENMT: Moist oral mucosa, dental hygiene normal  Pulm: nonlabored respirations  Neuro: Alert and oriented to self and situation, moves all extremities spontaneously  Psych: mood and affect appropriate    Test Results:  Lab Results   Component Value Date    WBC 14.9 (H) 09/12/2020 RBC 4.66 09/12/2020    HGB 15.5 09/12/2020    HCT 46.2 09/12/2020    MCV 99.3 09/12/2020    MCH 33.2 09/12/2020    MCHC 33.5 09/12/2020    RDW 12.5 09/12/2020    PLT 255 09/12/2020    MPV 9.7 09/12/2020     Lab Results   Component Value Date    NA 142 09/12/2020    K 4.2 09/12/2020    CL 108 (H) 09/12/2020    CO2 28.0 09/12/2020    BUN 6 (L) 09/12/2020    CREATININE 0.67 09/12/2020    GLU 142 09/12/2020    CALCIUM 9.1 09/12/2020    ALBUMIN 3.5 08/14/2020    PHOS 3.7 06/22/2020      Lab Results   Component Value Date    ALKPHOS 52 08/14/2020    BILITOT 1.0 08/14/2020    BILIDIR 0.40 03/15/2020    PROT 6.4 08/14/2020    ALBUMIN 3.5 08/14/2020    ALT 18 08/14/2020    AST 14 08/14/2020       Imaging: reviewed in Epic    This note was created using Dragon dictation software and while every attempt has been made to minimize errors, some may still be present.     Total time spent for evaluation & management (excluding ACP documented separately): 20 minutes    Greater than 50% of this time spent on counseling/coordination of care:  Yes.   See ACP Note from today for additional billable service:  No.     Zebedee Segundo K. Audria Nine, MD  Fellow, [facility] & Palliative Medicine

## 2020-09-12 NOTE — Unmapped (Signed)
PHYSICAL THERAPY  Evaluation (09/12/20 0902)     Patient Name:  Darrell [name]       Medical Record Number: [id number]   Date of Birth: 1986/05/08  Sex: Male            Treatment Diagnosis: Headache, Right frontotemporal lobe glioblastoma    Activity Tolerance: Tolerated treatment well    ASSESSMENT  Problem List: Pain     Assessment : Patient is a 34 y.o. 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 Adriana Simas at [facility], s/p resection and cyst drainage on 06/22/20 with Dr. Laqueta Jean, s/p 6000 cGy with Dr. 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 Patient presents today with ability to perform transfers and ambulate independently with no device, and as such does not require skilled acute PT intervention at this time. Patient will benefit from increased time OOB and daily ambulation in unit. Based on the AM-PAC five item raw score of 20/20, the patient is considered to be 0% impaired with basic mobility. This indicates that no PT f/u is currently indicated at this time. After a review of the personal factors, comorbidities, clinical presentation, and examination of the number of affected body systems, the patient presents as a moderate complexity case. Please re-consult with any changes in patient's functional mobility status.     Today's Interventions: Patient educated on role of PT, PT POC, importance of daily ambulation in unit, self-monitoring signs/symptoms of fatigue, activity pacing as needed. Patient denies any further questions/concerns with functional mobility at this time.                          PLAN  Planned Frequency of Treatment:  D/C Services for: D/C Services      Planned Interventions:      Post-Discharge Physical Therapy Recommendations:  PT services not indicated    PT DME Recommendations: None           Goals:   Patient and Family Goals: To return home Prognosis:  Good  Positive Indicators: PLOF, current level of function, motivation, family support  Barriers to Discharge: None    SUBJECTIVE     Patient reports: Patient and RN agreeable to PT, reports that he has been frequently ambulating off the unit  Current Functional Status: Patient received sitting EOB, ended session up ad lib, pt's brother at bedside, pt NAD, RN staff aware.     Prior Functional Status: Prior to admission, patient reports ambulating independently with no device, independent with ADLs, denies any recent falls. 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  Equipment available at home: None     Past Medical History:   Diagnosis Date   ??? Cancer (CMS-HCC)    ??? Migraine     Social History     Tobacco Use   ??? Smoking status: Current Every Day Smoker     Packs/day: 0.40     Years: 10.00     Pack years: 4.00     Types: Cigarettes   ??? Smokeless tobacco: Never Used   Substance Use Topics   ??? Alcohol use: Yes      Past Surgical History:   Procedure Laterality Date   ??? PR EXCIS SUPRATENT BRAIN TUMOR Right 06/22/2020    Procedure: CRANIECTOMY; EXC BRAIN TUMOR-SUPRATENTORIAL;  Surgeon: Corlis Leak, MD;  Location: MAIN OR [facility];  Service: Neurosurgery   ??? PR MICROSURG TECHNIQUES,REQ OPER MICROSCOPE Right 06/22/2020    Procedure: MICROSURGICAL TECHNIQUES, REQUIRING USE OF OPERATING MICROSCOPE (LIST SEPARATELY IN ADDITION TO CODE FOR PRIMARY PROCEDURE);  Surgeon: Corlis Leak, MD;  Location: MAIN OR [facility];  Service: Neurosurgery   ??? PR STEREOTACTIC COMP ASSIST PROC,CRANIAL,INTRADURAL N/A 06/22/2020    Procedure: STEREOTACTIC COMPUTER-ASSISTED (NAVIGATIONAL) PROCEDURE; CRANIAL, INTRADURAL;  Surgeon: Corlis Leak, MD;  Location: MAIN OR [facility];  Service: Neurosurgery    Family History   Problem Relation Age of Onset   ??? No Known Problems Mother    ??? No Known Problems Father    ??? No Known Problems Sister    ??? No Known Problems Brother    ??? No Known Problems Maternal Aunt    ??? No Known Problems Maternal Uncle    ??? No Known Problems Paternal Aunt    ??? No Known Problems Paternal Uncle    ??? No Known Problems Maternal Grandmother    ??? No Known Problems Maternal Grandfather    ??? No Known Problems Paternal Grandmother    ??? No Known Problems Paternal Grandfather    ??? No Known Problems Other    ??? Amblyopia Neg Hx    ??? Blindness Neg Hx    ??? Cancer Neg Hx    ??? Cataracts Neg Hx    ??? Diabetes Neg Hx    ??? Glaucoma Neg Hx    ??? Hypertension Neg Hx    ??? Macular degeneration Neg Hx    ??? Retinal detachment Neg Hx    ??? Strabismus Neg Hx    ??? Stroke Neg Hx    ??? Thyroid disease Neg Hx         Allergies: Patient has no known allergies.                Objective Findings  Precautions / Restrictions  Precautions: Non-applicable  Weight Bearing Status: Non-applicable  Required Braces or Orthoses: Non-applicable    Communication Preference: Verbal   Pain Comments: Patient reports 8/10 pain to anterior aspect of head, RN aware and reports pt received intervention prior to session.  Medical Tests / Procedures: Labs, orders, and imaging reviewed in Epic. MRI Brain 09/10/20: Significant increase in size of the postoperative right temporal lobe glioblastoma as compared to 07/09/2020, with marked increased size of the cystic/necrotic component as well as increased nodular enhancement along its posterior superomedial margin extending along the ependyma of the right lateral ventricle. Associated increased edema and mass effect results in new leftward midline midline shift of 2 cm and compression of the adjacent brainstem.  Equipment / Environment: Patient not wearing mask for full session, Vascular access (PIV, TLC, Port-a-cath, PICC)    At Rest: VSS per Epic     Orthostatics: Pt asymptomatic       Living Situation  Living Environment: House  Lives With: Sibling(s) (pt lives with his brother)  Home Living: Two level home, Able to Live on main level with bedroom/bathroom, Ramped entrance, Tub/shower unit, Standard height toilet     Cognition  Cognition: Within Medical laboratory scientific officer / Perception status  Visual/Perception: Wears Glasses/Contacts (Patient denies any recent visual changes)  Skin Inspection: Visible skin appears intact    UE ROM / Strength  UE ROM/Strength: Left Intact, Right Intact  LE ROM / Strength  LE ROM/Strength: Left Intact, Right Intact           Sensation: Patient denies numbness/tingling in  bilateral UEs/LEs  Balance: Patient sits unsupported EOB independently; patient stands with no device with good standing balance during dynamic tasks         Bed Mobility: Not formally assessed, pt received sitting EOB and ended session up ad lib; pt reports independence with bed mobility  Transfers: Patient transfers sit to/from stand independently with no device, pt steady upon assuming initial standing position   Gait  Level of Assistance: Independent  Assistive Device: None  Distance Ambulated (ft): 50 ft  Gait: Patient ambulates independently with no device, able to perform turns and change directions without LOB  Stairs: Patient reports ramped entrance into home      Endurance: Intact, patient denies fatigue    Physical Therapy Session Duration  PT Individual [mins]: 10    Medical Staff Made Aware: RN Olegario Messier aware    I attest that I have reviewed the above information.  Signed: Fernanda Drum, PT  Filed 09/12/2020

## 2020-09-12 NOTE — Unmapped (Signed)
Oncology (MEDO) Progress Note    Assessment & Plan:   Darrell [name] is a 34 y.o. 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 Adriana Simas at [facility], s/p resection and cyst drainage on 06/22/20 with Dr. Laqueta Jean, s/p 6000 cGy with Dr. 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     Principal Problem:    Glioblastoma (CMS-HCC)  Active Problems:    Anxiety    Migraine without aura and without status migrainosus, not intractable    Seizure (CMS-HCC)    Nausea & vomiting  Resolved Problems:    * No resolved hospital problems. *    Right frontotemporal lobe glioblastoma (WHO Grade IV): s/p gross total resection on 03/21/20 by Dr. Adriana Simas at [facility], s/p resection and cyst drainage on 06/22/20 with Dr. Laqueta Jean, s/p 6000 cGy with Dr. 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 Course complicated by post surgical cysts and one seizure. Now with significant increase in size of the postoperative right temporal lobe glioblastoma w edema and 2 cm midline shift, compression of the adjacent brainstem. Presents with headaches but no focal deficits. Neurosurgery consulted and planning for Ommaya reservoir placement for drainage of cyst on 9/16. In meantime, will continue dexamethasone and AEDs.  -Neurosurgery consulted, planning for cyst drainage 9/16, pt to be NPO at midnight  -Continue dexamethasone 4mg  q4  -Continue home keppra 1g BID  -Plan to initiate TMZ next week as scheduled if patient still inpatient    Headaches/nausea  Severe headaches, difficult to control medically. Palliative care consulted, appreciate recs.  -Starting standing IV acetaminophen 650mg  q6, continuing oxycodone 5mg /10mg  PRN and morphine 4mg  for breakthrough pain  -continue home nortriptyline 100 daily, baclofen 20 PRN tid  -continue??magnesium 400 mg/d and riboflavin 100 mg/d  -continue home compazine standing TID, zofran ODT, IV zofran PRN    Appetite: continue home marinol   ??  Anxiety/sleep: switching from xanax to ativan 1mg  at bedtime prn, trazodone 50 at bedtime prn    Daily Checklist:  Diet: Regular Diet  DVT PPx: SCDs  Electrolytes: Replete Potassium to >/= 3.6 and Magnesium to >/= 1.8  Code Status: Full Code    Team Contact Information:   Primary Team: Oncology (MEDO)  Primary Resident: Lynann Beaver, MD  Resident's Pager: [phone] (Oncology Intern - Cliffton Asters)    Interval History:   Patient still with headache, reports morphine somewhat effective in controlling pain but only for short duration. Not currently experiencing any nausea.     Objective:   Temp:  [36.4 ??C-37.2 ??C] 36.7 ??C  Heart Rate:  [74-94] 76  Resp:  [17-18] 18  BP: (97-133)/(2-90) 127/75  SpO2:  [95 %-100 %] 100 %    Gen: WDWN in NAD, answers questions appropriately  Eyes: sclera anicteric  HENT: atraumatic, MMM, OP w/o erythema or exudate. Surgical scar noted.  Heart: RRR, S1, S2, no M/R/G, no chest wall tenderness  Lungs: CTAB, no crackles or wheezes, no use of accessory muscles  Abdomen: Normoactive bowel sounds, soft, NTND, no rebound/guarding  Extremities: no clubbing, cyanosis, or edema in the BLEs  Psych: Alert, oriented, appropriate mood and affect  Neuro: CN II-XII intact, 5/5 strength UEs + LEs bilaterally, nl sensation    Labs/Studies: Labs and Studies from the last 24hrs per EMR and Reviewed

## 2020-09-13 LAB — BASIC METABOLIC PANEL
ANION GAP: 9 mmol/L (ref 5–14)
BLOOD UREA NITROGEN: 6 mg/dL — ABNORMAL LOW (ref 9–23)
BUN / CREAT RATIO: 8
CHLORIDE: 108 mmol/L — ABNORMAL HIGH (ref 98–107)
CO2: 26 mmol/L (ref 20.0–31.0)
CREATININE: 0.77 mg/dL
EGFR CKD-EPI AA MALE: >90 mL/min/{1.73_m2} (ref >=60)
EGFR CKD-EPI NON-AA MALE: >90 mL/min/{1.73_m2} (ref >=60)
GLUCOSE RANDOM: 111 mg/dL (ref 70–179)
POTASSIUM: 4.3 mmol/L (ref 3.4–4.5)
SODIUM: 143 mmol/L (ref 135–145)

## 2020-09-13 LAB — MAGNESIUM: Magnesium:MCnc:Pt:Ser/Plas:Qn:: 2.1

## 2020-09-13 LAB — CBC
HEMATOCRIT: 49.6 % (ref 41.0–53.0)
HEMOGLOBIN: 16.6 g/dL (ref 13.5–17.5)
MEAN CORPUSCULAR HEMOGLOBIN: 33.4 pg (ref 26.0–34.0)
MEAN CORPUSCULAR VOLUME: 99.7 fL (ref 80.0–100.0)
MEAN PLATELET VOLUME: 9.4 fL (ref 7.0–10.0)
RED CELL DISTRIBUTION WIDTH: 12.4 % (ref 12.0–15.0)
WBC ADJUSTED: 12.7 10*9/L — ABNORMAL HIGH (ref 4.5–11.0)

## 2020-09-13 LAB — INR: Coagulation tissue factor induced.INR:RelTime:Pt:PPP:Qn:Coag: 0.86

## 2020-09-13 LAB — SODIUM: Sodium:SCnc:Pt:Ser/Plas:Qn:: 143

## 2020-09-13 LAB — RED CELL DISTRIBUTION WIDTH: Lab: 12.4

## 2020-09-13 LAB — HEPARIN CORRELATION: Lab: <0.2

## 2020-09-13 MED ORDER — OXYCODONE 10 MG TABLET
ORAL_TABLET | ORAL | 0 refills | 2.00000 days | PRN
Start: 2020-09-13 — End: 2020-09-18

## 2020-09-13 MED ORDER — DEXAMETHASONE SODIUM PHOSPHATE 4 MG/ML INJECTION SOLUTION
Freq: Two times a day (BID) | INTRAVENOUS | 0 refills | 1.00000 days
Start: 2020-09-13 — End: ?

## 2020-09-13 MED ADMIN — oxyCODONE (ROXICODONE) immediate release tablet 10 mg: 10 mg | ORAL | @ 19:00:00 | Stop: 2020-09-25

## 2020-09-13 MED ADMIN — MORPhine 4 mg/mL injection 1 mg: 1 mg | INTRAVENOUS | @ 23:00:00 | Stop: 2020-09-13

## 2020-09-13 MED ADMIN — oxyCODONE (ROXICODONE) immediate release tablet 10 mg: 10 mg | ORAL | @ 11:00:00 | Stop: 2020-09-25

## 2020-09-13 MED ADMIN — fentaNYL (PF) (SUBLIMAZE) injection: INTRAVENOUS | @ 20:00:00 | Stop: 2020-09-13

## 2020-09-13 MED ADMIN — ROCuronium (ZEMURON) injection: INTRAVENOUS | @ 21:00:00 | Stop: 2020-09-13

## 2020-09-13 MED ADMIN — dronabinoL (MARINOL) capsule 10 mg: 10 mg | ORAL | @ 15:00:00

## 2020-09-13 MED ADMIN — LORazepam (ATIVAN) tablet 1 mg: 1 mg | ORAL | @ 03:00:00

## 2020-09-13 MED ADMIN — propofol (DIPRIVAN) infusion 10 mg/mL: INTRAVENOUS | @ 21:00:00 | Stop: 2020-09-13

## 2020-09-13 MED ADMIN — lactated Ringers infusion: INTRAVENOUS | @ 20:00:00 | Stop: 2020-09-13

## 2020-09-13 MED ADMIN — midazolam (VERSED) injection: INTRAVENOUS | @ 20:00:00 | Stop: 2020-09-13

## 2020-09-13 MED ADMIN — dexamethasone (DECADRON) 4 mg/mL injection 4 mg: 4 mg | INTRAVENOUS | @ 05:00:00 | Stop: 2020-09-13

## 2020-09-13 MED ADMIN — pantoprazole (PROTONIX) EC tablet 20 mg: 20 mg | ORAL | @ 19:00:00

## 2020-09-13 MED ADMIN — prochlorperazine (COMPAZINE) tablet 10 mg: 10 mg | ORAL | @ 11:00:00

## 2020-09-13 MED ADMIN — dexamethasone (DECADRON) 4 mg/mL injection: INTRAVENOUS | @ 21:00:00 | Stop: 2020-09-13

## 2020-09-13 MED ADMIN — lidocaine (XYLOCAINE) 20 mg/mL (2 %) injection: INTRAVENOUS | @ 20:00:00 | Stop: 2020-09-13

## 2020-09-13 MED ADMIN — phenylephrine HCl in 0.9% NaCl 0.8 mg/10 mL (80 mcg/mL) injection Syrg: INTRAVENOUS | @ 22:00:00 | Stop: 2020-09-13

## 2020-09-13 MED ADMIN — levETIRAcetam (KEPPRA) tablet 1,000 mg: 1000 mg | ORAL

## 2020-09-13 MED ADMIN — propofoL (DIPRIVAN) injection: INTRAVENOUS | @ 20:00:00 | Stop: 2020-09-13

## 2020-09-13 MED ADMIN — glycopyrrolate (ROBINUL) injection: INTRAVENOUS | @ 22:00:00 | Stop: 2020-09-13

## 2020-09-13 MED ADMIN — cholecalciferol (vitamin D3 25 mcg (1,000 units)) tablet 25 mcg: 25 ug | ORAL | @ 12:00:00

## 2020-09-13 MED ADMIN — sodium chloride irrigation (NS) 0.9 % irrigation solution: @ 21:00:00 | Stop: 2020-09-13

## 2020-09-13 MED ADMIN — ondansetron (ZOFRAN) injection: INTRAVENOUS | @ 22:00:00 | Stop: 2020-09-13

## 2020-09-13 MED ADMIN — nortriptyline (PAMELOR) capsule 100 mg: 100 mg | ORAL | @ 12:00:00

## 2020-09-13 MED ADMIN — fentaNYL (PF) (SUBLIMAZE) injection: INTRAVENOUS | @ 21:00:00 | Stop: 2020-09-13

## 2020-09-13 MED ADMIN — acetaminophen (OFIRMEV) 10 mg/mL injection 650 mg 65 mL: 650 mg | INTRAVENOUS | Stop: 2020-09-13

## 2020-09-13 MED ADMIN — phenylephrine HCl in 0.9% NaCl 0.8 mg/10 mL (80 mcg/mL) injection Syrg: INTRAVENOUS | @ 21:00:00 | Stop: 2020-09-13

## 2020-09-13 MED ADMIN — electrolyte-A (PLASMA-LYT A) infusion: INTRAVENOUS | @ 21:00:00 | Stop: 2020-09-13

## 2020-09-13 MED ADMIN — dronabinoL (MARINOL) capsule 10 mg: 10 mg | ORAL | @ 11:00:00

## 2020-09-13 MED ADMIN — acetaminophen (OFIRMEV) 10 mg/mL injection 650 mg 65 mL: 650 mg | INTRAVENOUS | @ 13:00:00 | Stop: 2020-09-13

## 2020-09-13 MED ADMIN — fentaNYL (PF) (SUBLIMAZE) injection 25 mcg: 25 ug | INTRAVENOUS | @ 23:00:00 | Stop: 2020-09-13

## 2020-09-13 MED ADMIN — MORPhine 4 mg/mL injection 1 mg: 1 mg | INTRAVENOUS | Stop: 2020-09-13

## 2020-09-13 MED ADMIN — lidocaine-EPINEPHrine (XYLOCAINE W/EPI) 1 %-1:100,000 injection: @ 21:00:00 | Stop: 2020-09-13

## 2020-09-13 MED ADMIN — oxyCODONE (ROXICODONE) immediate release tablet 10 mg: 10 mg | ORAL | @ 04:00:00 | Stop: 2020-09-25

## 2020-09-13 MED ADMIN — ceFAZolin (ANCEF) injection: INTRAVENOUS | @ 21:00:00 | Stop: 2020-09-13

## 2020-09-13 MED ADMIN — ROCuronium (ZEMURON) injection: INTRAVENOUS | @ 20:00:00 | Stop: 2020-09-13

## 2020-09-13 MED ADMIN — riboflavin (vitamin B2) tablet 100 mg: 100 mg | ORAL | @ 12:00:00

## 2020-09-13 MED ADMIN — dexamethasone (DECADRON) 4 mg/mL injection 8 mg: 8 mg | INTRAVENOUS | @ 15:00:00 | Stop: 2020-09-13

## 2020-09-13 MED ADMIN — neostigmine (BLOXIVERZ) injection: INTRAVENOUS | @ 22:00:00 | Stop: 2020-09-13

## 2020-09-13 MED ADMIN — MORPhine 4 mg/mL injection 4 mg: 4 mg | INTRAVENOUS | @ 02:00:00

## 2020-09-13 MED ADMIN — fentaNYL (PF) (SUBLIMAZE) injection: INTRAVENOUS | @ 22:00:00 | Stop: 2020-09-13

## 2020-09-13 NOTE — Unmapped (Signed)
/\

## 2020-09-13 NOTE — Unmapped (Signed)
Oncology (MEDO) Progress Note    Assessment & Plan:   Darrell [name] is a 34 y.o. 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 Adriana Simas at [facility], s/p resection and cyst drainage on 06/22/20 with Dr. Laqueta Jean, s/p 6000 cGy with Dr. 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     Principal Problem:    Glioblastoma (CMS-HCC)  Active Problems:    Anxiety    Migraine without aura and without status migrainosus, not intractable    Seizure (CMS-HCC)    Nausea & vomiting  Resolved Problems:    * No resolved hospital problems. *    Right frontotemporal lobe glioblastoma (WHO Grade IV): s/p gross total resection on 03/21/20 by Dr. Adriana Simas at [facility], s/p resection and cyst drainage on 06/22/20 with Dr. Laqueta Jean, s/p 6000 cGy with Dr. 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 Course complicated by post surgical cysts and one seizure. Now with significant increase in size of the postoperative right temporal lobe glioblastoma w edema and 2 cm midline shift, compression of the adjacent brainstem. Presents with headaches but no focal deficits. Neurosurgery consulted and planning for Ommaya reservoir placement for drainage of cyst on afternoon of 9/16. In meantime, will continue dexamethasone and AEDs.  -Neurosurgery consulted, planning for cyst drainage 9/16, pt to be NPO at midnight  -Continue dexamethasone 8mg  q12  -Continue home keppra 1g BID  -Plan to initiate TMZ next week as scheduled if patient still inpatient    Headaches/nausea  Severe headaches, difficult to control medically. Palliative care consulted, appreciate recs.  -Starting standing IV acetaminophen 650mg  q6, continuing oxycodone 5mg /10mg  PRN and morphine 4mg  for breakthrough pain  -continue home nortriptyline 100 daily, baclofen 20 PRN tid  -continue??magnesium 400 mg/d and riboflavin 100 mg/d  -continue home compazine standing TID, zofran ODT, IV zofran PRN    Appetite: continue home marinol   ??  Anxiety/sleep: switching from xanax to ativan 1mg  at bedtime prn, trazodone 50 at bedtime prn    Daily Checklist:  Diet: Regular Diet  DVT PPx: SCDs  Electrolytes: Replete Potassium to >/= 3.6 and Magnesium to >/= 1.8  Code Status: Full Code    Team Contact Information:   Primary Team: Oncology (MEDO)  Primary Resident: Lynann Beaver, MD  Resident's Pager: [phone] (Oncology Intern - Cliffton Asters)    Interval History:   Patient still with headache, similar to prior. Eager to get ommaya placed today.    Objective:   Temp:  [36.3 ??C-37.1 ??C] 37.1 ??C  Heart Rate:  [66-106] 101  Resp:  [18-19] 19  BP: (108-151)/(71-99) 133/98  SpO2:  [94 %-100 %] 98 %    Gen: WDWN in NAD, answers questions appropriately  Eyes: sclera anicteric  HENT: atraumatic, MMM, OP w/o erythema or exudate. Surgical scar noted.  Heart: RRR, S1, S2, no M/R/G, no chest wall tenderness  Lungs: CTAB, no crackles or wheezes, no use of accessory muscles  Abdomen: Normoactive bowel sounds, soft, NTND, no rebound/guarding  Extremities: no clubbing, cyanosis, or edema in the BLEs  Psych: Alert, oriented, appropriate mood and affect  Neuro: CN II-XII intact, 5/5 strength UEs + LEs bilaterally, nl sensation    Labs/Studies: Labs and Studies from the last 24hrs per EMR and Reviewed

## 2020-09-13 NOTE — Unmapped (Signed)
OCCUPATIONAL THERAPY  Evaluation (09/13/20 1345)    Patient Name:  Darrell [name]       Medical Record Number: [id number]   Date of Birth: 05/27/1986  Sex: Male          OT Treatment Diagnosis:  Impaired vision    Assessment  Problem List: Impaired vision  Assessment: Darrell Buchmann is a 34 y.o. 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 Adriana Simas at [facility], s/p resection and cyst drainage on 06/22/20 with Dr. Laqueta Jean, s/p 6000 cGy with Dr. 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 Pt presented to OT eval with decreased vision; however, this does not hinder his ability to perform ADLs safely and independently. Therefore, recommend d/c from acute OT services and no post-acute needs. After review of the pt's occupational profile and history, assessment of occupational performance, clinical decision making, and development of POC, the pt presents as a low complexity case. Please re-consult if pt's status changes.   Today's Interventions: Educated pt on role of OT, POC, safety during functional mobility, activity pacing strategies, and energy conservation techniques with pt verbalizing understanding. Encouraged pt to engage in and supervised bed mobility, sitting balance/tolerance, standing balance/tolerance, and functional mobility    Activity Tolerance During Today's Session  Tolerated treatment well    Plan  Planned Frequency of Treatment:  D/C Services for: D/C Services       Post-Discharge Occupational Therapy Recommendations:  OT services not indicated   OT DME Recommendations: None    GOALS:   Patient and Family Goals: To return home    Prognosis:  Good  Positive Indicators:  PLOF, family support  Barriers to Discharge: None    Subjective  Current Status Pt received semi-reclined in bed with brother present. Pt left standing in room with brother present and RN notified  Prior Functional Status Pt reported independence in all ADLs PTA. Pt denied utilizing any AD for functional mobility and a h/o falls. Pt enjoys playing videogames    Medical Tests / Procedures: Reviewed in EPIC       Patient / Caregiver reports: I'm doing well    Past Medical History:   Diagnosis Date   ??? Cancer (CMS-HCC)    ??? Migraine     Social History     Tobacco Use   ??? Smoking status: Current Every Day Smoker     Packs/day: 0.40     Years: 10.00     Pack years: 4.00     Types: Cigarettes   ??? Smokeless tobacco: Never Used   Substance Use Topics   ??? Alcohol use: Yes      Past Surgical History:   Procedure Laterality Date   ??? PR EXCIS SUPRATENT BRAIN TUMOR Right 06/22/2020    Procedure: CRANIECTOMY; EXC BRAIN TUMOR-SUPRATENTORIAL;  Surgeon: Corlis Leak, MD;  Location: MAIN OR [facility];  Service: Neurosurgery   ??? PR MICROSURG TECHNIQUES,REQ OPER MICROSCOPE Right 06/22/2020    Procedure: MICROSURGICAL TECHNIQUES, REQUIRING USE OF OPERATING MICROSCOPE (LIST SEPARATELY IN ADDITION TO CODE FOR PRIMARY PROCEDURE);  Surgeon: Corlis Leak, MD;  Location: MAIN OR [facility];  Service: Neurosurgery   ??? PR STEREOTACTIC COMP ASSIST PROC,CRANIAL,INTRADURAL N/A 06/22/2020    Procedure: STEREOTACTIC COMPUTER-ASSISTED (NAVIGATIONAL) PROCEDURE; CRANIAL, INTRADURAL;  Surgeon: Corlis Leak, MD;  Location: MAIN OR [facility];  Service: Neurosurgery    Family History   Problem Relation Age of  Onset   ??? No Known Problems Mother    ??? No Known Problems Father    ??? No Known Problems Sister    ??? No Known Problems Brother    ??? No Known Problems Maternal Aunt    ??? No Known Problems Maternal Uncle    ??? No Known Problems Paternal Aunt    ??? No Known Problems Paternal Uncle    ??? No Known Problems Maternal Grandmother    ??? No Known Problems Maternal Grandfather    ??? No Known Problems Paternal Grandmother    ??? No Known Problems Paternal Grandfather    ??? No Known Problems Other    ??? Amblyopia Neg Hx    ??? Blindness Neg Hx    ??? Cancer Neg Hx    ??? Cataracts Neg Hx    ??? Diabetes Neg Hx    ??? Glaucoma Neg Hx    ??? Hypertension Neg Hx    ??? Macular degeneration Neg Hx    ??? Retinal detachment Neg Hx    ??? Strabismus Neg Hx    ??? Stroke Neg Hx    ??? Thyroid disease Neg Hx    ??? Anesthesia problems Neg Hx         Patient has no known allergies.     Objective Findings  Precautions / Restrictions  Non-applicable    Weight Bearing  Non-applicable    Required Braces or Orthoses  Non-applicable    Communication Preference  Verbal    Pain  Pt reported headache at beginning and end of session. RN aware    Equipment / Environment  Vascular access (PIV, TLC, Port-a-cath, PICC), Patient wearing mask for full session, Caregiver wearing mask for full session    Living Situation  Living Environment: House  Lives With: Sibling(s) (brother)  Home Living: Two level home, Able to Live on main level with bedroom/bathroom, Ramped entrance, Tub/shower unit, Standard height toilet, Shower chair with back, Grab bars in shower  Equipment available at home: Shower chair with back     Cognition   Orientation Level:  Oriented x 4   Arousal/Alertness:  Appropriate responses to stimuli   Attention Span:  Appears intact   Memory:  Decreased short term memory (pt reported short term memory deficits, but no difficulty with orientation or managing medications)   Following Commands:  Follows all commands and directions without difficulty   Safety Judgment:  Good awareness of safety precautions   Awareness of Errors:  Good awareness of safety precautions   Problem Solving:  Able to problem solve independently    Vision / Perception    Hearing: WFL   Vision: Wears glasses all the time     Comments: Pt reported his vision is always blurry    Hand Function     B grip strength WFL    Skin Inspection  Visible skin c/d/i    ROM / Strength/Coordination  UE ROM/ Strength/ Coordination: BUE AROM and strength WFL  LE ROM/ Strength/ Coordination: Pt able to move BLE against gravity. Not formally assessed. Defer to PT note    Sensation:  Pt denied paresthesias    Balance:  Sitting= independent. Standing= independent. Ambulating= independent    Functional Mobility  Transfer Assistance Needed: No (Pt independently t/f EOB > standing)  Bed Mobility Assistance Needed: No (Pt independently t/f supine > EOB)  Ambulation: Pt independently ambulated ~392ft in hallway. Pt demonstrated no LOB      ADLs  ADLs: Independent  IADLs: NT  Vitals / Orthostatics  At Rest: NAD  With Activity: NAD  Orthostatics: Asymptomatic      Medical Staff Made Aware: RN notified      Occupational Therapy Session Duration  OT Individual [mins]: 15         I attest that I have reviewed the above information.  Signed: Su Grand, OT  Filed 09/13/2020

## 2020-09-13 NOTE — Unmapped (Signed)
NEUROSURGERY CONSULT PROGRESS NOTE    Requesting Service Attending Physician:  Fritz Pickerel  Neurosurgery Attending of Record: Marylouise Stacks, MD     Brief History of Present Illness  Darrell [name] is a 34 y.o. male who has a PMH significant for migraine, anxiety and GBM s/p multiple resections (03/01/20 at [facility], 06/23/20) who presents with recurrence and enlargement of a cyst.    Subjective/Interval History  Postoperative exam    Interval imaging reviewed  None    Assessment and Recommendations  POD 0 R Ommaya reservoir placement  - SBP < 160  - Na > 135  - No postop imaging needed  - ADAT  - No activity restrictions    Please page the Neurosurgery consult pager at [phone] with questions/concerns.   ___________________________________________________________________    Neurological Exam  Awake, but slightly groggy from anesthesia  Oriented x 3  PERRL  EOMI  F=  BUE 4+  BLE 4+  CDI    Problem List  Principal Problem:    Glioblastoma (CMS-HCC)  Active Problems:    Anxiety    Migraine without aura and without status migrainosus, not intractable    Seizure (CMS-HCC)    Nausea & vomiting  Resolved Problems:    * No resolved hospital problems. *

## 2020-09-13 NOTE — Unmapped (Signed)
VSS. PRN oxycodone and morphine given for headache overnight. PRN ativan given for anxiety and PRN pantoprazole given for heartburn. Pt has been NPO since midnight for upcoming ommaya placement today. Friend at bedside. Ctm    Problem: Adult Inpatient Plan of Care  Goal: Plan of Care Review  Outcome: Ongoing - Unchanged  Goal: Patient-Specific Goal (Individualized)  Outcome: Ongoing - Unchanged  Goal: Absence of Hospital-Acquired Illness or Injury  Outcome: Ongoing - Unchanged  Intervention: Identify and Manage Fall Risk  Recent Flowsheet Documentation  Taken 09/12/2020 1915 by Olga Coaster, RN  Safety Interventions:   environmental modification   fall reduction program maintained   infection management   isolation precautions   lighting adjusted for tasks/safety   low bed   muscle strengthening facilitated   nonskid shoes/slippers when out of bed  Intervention: Prevent Infection  Recent Flowsheet Documentation  Taken 09/12/2020 1915 by Olga Coaster, RN  Infection Prevention:   environmental surveillance performed   equipment surfaces disinfected   hand hygiene promoted   personal protective equipment utilized   rest/sleep promoted   single patient room provided   visitors restricted/screened  Goal: Optimal Comfort and Wellbeing  Outcome: Ongoing - Unchanged  Goal: Readiness for Transition of Care  Outcome: Ongoing - Unchanged  Goal: Rounds/Family Conference  Outcome: Ongoing - Unchanged

## 2020-09-14 LAB — BUN / CREAT RATIO: Urea nitrogen/Creatinine:MRto:Pt:Ser/Plas:Qn:: 11

## 2020-09-14 LAB — CBC
HEMOGLOBIN: 14.2 g/dL (ref 13.5–17.5)
MEAN CORPUSCULAR HEMOGLOBIN CONC: 32.8 g/dL (ref 31.0–37.0)
MEAN CORPUSCULAR HEMOGLOBIN: 33.1 pg (ref 26.0–34.0)
MEAN CORPUSCULAR VOLUME: 100.9 fL — ABNORMAL HIGH (ref 80.0–100.0)
MEAN PLATELET VOLUME: 9.8 fL (ref 7.0–10.0)
PLATELET COUNT: 230 10*9/L (ref 150–440)
RED BLOOD CELL COUNT: 4.29 10*12/L — ABNORMAL LOW (ref 4.50–5.90)
RED CELL DISTRIBUTION WIDTH: 12.8 % (ref 12.0–15.0)
WBC ADJUSTED: 13.4 10*9/L — ABNORMAL HIGH (ref 4.5–11.0)

## 2020-09-14 LAB — BASIC METABOLIC PANEL
ANION GAP: 4 mmol/L — ABNORMAL LOW (ref 5–14)
BLOOD UREA NITROGEN: 7 mg/dL — ABNORMAL LOW (ref 9–23)
BUN / CREAT RATIO: 11
CALCIUM: 8.6 mg/dL — ABNORMAL LOW (ref 8.7–10.4)
CHLORIDE: 108 mmol/L — ABNORMAL HIGH (ref 98–107)
CO2: 29 mmol/L (ref 20.0–31.0)
CREATININE: 0.65 mg/dL — ABNORMAL LOW
EGFR CKD-EPI AA MALE: >90 mL/min/{1.73_m2} (ref >=60)
EGFR CKD-EPI NON-AA MALE: >90 mL/min/{1.73_m2} (ref >=60)
POTASSIUM: 3.8 mmol/L (ref 3.4–4.5)
SODIUM: 141 mmol/L (ref 135–145)

## 2020-09-14 LAB — MEAN CORPUSCULAR VOLUME: Erythrocyte mean corpuscular volume:EntVol:Pt:RBC:Qn:Automated count: 100.9 — ABNORMAL HIGH

## 2020-09-14 MED ORDER — DEXAMETHASONE 4 MG TABLET
ORAL_TABLET | Freq: Two times a day (BID) | ORAL | 0 refills | 30.00 days | Status: CP
Start: 2020-09-14 — End: 2020-10-14

## 2020-09-14 MED ORDER — OXYCODONE 5 MG TABLET
ORAL_TABLET | ORAL | 0 refills | 5.00 days | Status: CP | PRN
Start: 2020-09-14 — End: 2020-09-19

## 2020-09-14 MED ORDER — DRONABINOL 10 MG CAPSULE
ORAL_CAPSULE | Freq: Three times a day (TID) | ORAL | 0 refills | 5.00 days | Status: CP
Start: 2020-09-14 — End: 2020-09-19

## 2020-09-14 MED ORDER — LORAZEPAM 1 MG TABLET
ORAL_TABLET | Freq: Every evening | ORAL | 0 refills | 7.00000 days | Status: CN | PRN
Start: 2020-09-14 — End: 2020-09-21

## 2020-09-14 MED ADMIN — levETIRAcetam (KEPPRA) tablet 1,000 mg: 1000 mg | ORAL | @ 01:00:00

## 2020-09-14 MED ADMIN — pantoprazole (PROTONIX) EC tablet 20 mg: 20 mg | ORAL | @ 13:00:00 | Stop: 2020-09-14

## 2020-09-14 MED ADMIN — oxyCODONE (ROXICODONE) immediate release tablet 5 mg: 5 mg | ORAL | Stop: 2020-09-13

## 2020-09-14 MED ADMIN — prochlorperazine (COMPAZINE) tablet 10 mg: 10 mg | ORAL | @ 12:00:00 | Stop: 2020-09-14

## 2020-09-14 MED ADMIN — magnesium oxide (MAG-OX) tablet 400 mg: 400 mg | ORAL | @ 12:00:00 | Stop: 2020-09-14

## 2020-09-14 MED ADMIN — oxyCODONE (ROXICODONE) immediate release tablet 10 mg: 10 mg | ORAL | @ 12:00:00 | Stop: 2020-09-14

## 2020-09-14 MED ADMIN — nortriptyline (PAMELOR) capsule 100 mg: 100 mg | ORAL | @ 12:00:00 | Stop: 2020-09-14

## 2020-09-14 MED ADMIN — levETIRAcetam (KEPPRA) tablet 1,000 mg: 1000 mg | ORAL | @ 12:00:00 | Stop: 2020-09-14

## 2020-09-14 MED ADMIN — dexamethasone (DECADRON) 4 mg/mL injection 8 mg: 8 mg | INTRAVENOUS | @ 12:00:00 | Stop: 2020-09-14

## 2020-09-14 MED ADMIN — MORPhine 4 mg/mL injection 4 mg: 4 mg | INTRAVENOUS | @ 04:00:00

## 2020-09-14 MED ADMIN — ceFAZolin (ANCEF) IVPB 2 g in 50 ml dextrose (premix): 2 g | INTRAVENOUS | @ 08:00:00 | Stop: 2020-09-14

## 2020-09-14 MED ADMIN — MORPhine 4 mg/mL injection 4 mg: 4 mg | INTRAVENOUS | @ 15:00:00 | Stop: 2020-09-14

## 2020-09-14 MED ADMIN — riboflavin (vitamin B2) tablet 100 mg: 100 mg | ORAL | @ 12:00:00 | Stop: 2020-09-14

## 2020-09-14 MED ADMIN — oxyCODONE (ROXICODONE) immediate release tablet 5 mg: 5 mg | ORAL | @ 02:00:00 | Stop: 2020-09-25

## 2020-09-14 MED ADMIN — oxyCODONE (ROXICODONE) immediate release tablet 10 mg: 10 mg | ORAL | @ 08:00:00 | Stop: 2020-09-14

## 2020-09-14 MED ADMIN — ondansetron (ZOFRAN) injection 4 mg: 4 mg | INTRAVENOUS | @ 11:00:00 | Stop: 2020-09-14

## 2020-09-14 NOTE — Unmapped (Signed)
Patient admitted to room 7101 from PACU at approx 2115.  Patient anxious, tremors noted, requesting home meds.  C/o HA.  See MAR, paged MD regarding resuming home meds for anxiety and sleep.  Patient ambulatory, family at the bedside.  Discussed POC with patient.  Incisions intact post-procedure.  Call bell in reach.  Needs met by nursing personnel.  Will continue to monitor for PM shift.     Problem: Adult Inpatient Plan of Care  Goal: Plan of Care Review  Outcome: Ongoing - Unchanged  Goal: Patient-Specific Goal (Individualized)  Outcome: Ongoing - Unchanged  Goal: Absence of Hospital-Acquired Illness or Injury  Outcome: Ongoing - Unchanged  Intervention: Prevent Skin Injury  Recent Flowsheet Documentation  Taken 09/14/2020 0100 by Williemae Natter Rhyan Wolters, RN  Skin Protection: adhesive use limited  Intervention: Prevent and Manage VTE (Venous Thromboembolism) Risk  Recent Flowsheet Documentation  Taken 09/14/2020 0100 by [employer], RN  VTE Prevention/Management: ambulation promoted  Goal: Optimal Comfort and Wellbeing  Outcome: Ongoing - Unchanged  Goal: Readiness for Transition of Care  Outcome: Ongoing - Unchanged  Goal: Rounds/Family Conference  Outcome: Ongoing - Unchanged     Problem: Fall Injury Risk  Goal: Absence of Fall and Fall-Related Injury  Outcome: Ongoing - Unchanged     Problem: Impaired Wound Healing  Goal: Optimal Wound Healing  Outcome: Ongoing - Unchanged

## 2020-09-14 NOTE — Unmapped (Signed)
Pt with on going headache and nausea. Oxycodone 10mg , given prn Q4 with Morphine 4mg  IV in between for breatkthrough.  Pt reports 8/10 pain prior to meds, then will fall asleep, reporting some relief on awakening.  Scheduled nausea medications given.  Pt NPO for Ommaya procedure. and denies nausea.   Pt prepped with CHG wipes, placed in patient gown and hospital socks only. Jewelry removed. Pt transported to procedural holding area with family member.    Problem: Adult Inpatient Plan of Care  Goal: Plan of Care Review  Outcome: Ongoing - Unchanged  Goal: Patient-Specific Goal (Individualized)  Outcome: Ongoing - Unchanged  Goal: Absence of Hospital-Acquired Illness or Injury  Outcome: Ongoing - Unchanged  Intervention: Identify and Manage Fall Risk  Recent Flowsheet Documentation  Taken 09/13/2020 0900 by Viviana Simpler, RN  Safety Interventions:   commode/urinal/bedpan at bedside   fall reduction program maintained   family at bedside   infection management   isolation precautions   low bed   nonskid shoes/slippers when out of bed  Intervention: Prevent Infection  Recent Flowsheet Documentation  Taken 09/13/2020 0900 by Viviana Simpler, RN  Infection Prevention:   hand hygiene promoted   personal protective equipment utilized  Goal: Optimal Comfort and Wellbeing  Outcome: Ongoing - Unchanged  Goal: Readiness for Transition of Care  Outcome: Ongoing - Unchanged  Goal: Rounds/Family Conference  Outcome: Ongoing - Unchanged     Problem: Fall Injury Risk  Goal: Absence of Fall and Fall-Related Injury  Outcome: Ongoing - Unchanged  Intervention: Promote Injury-Free Environment  Recent Flowsheet Documentation  Taken 09/13/2020 0900 by Viviana Simpler, RN  Safety Interventions:   commode/urinal/bedpan at bedside   fall reduction program maintained   family at bedside   infection management   isolation precautions   low bed   nonskid shoes/slippers when out of bed

## 2020-09-14 NOTE — Unmapped (Signed)
NEUROSURGERY DISCHARGE SUMMARY    Identifying Information:   Darrell [name]  [birth year]  [id number]    Admit date: 09/10/2020    Discharge date: 09/14/2020     Discharge Service: Neurosurgery [facility])    Discharge Attending Physician: Odie Sera, MD    Discharge to: Home    Discharge Diagnoses:  Principal Problem:    Glioblastoma (CMS-HCC)  Active Problems:    Anxiety    Migraine without aura and without status migrainosus, not intractable    Seizure (CMS-HCC)    Nausea & vomiting  Resolved Problems:    * No resolved hospital problems. *      Hospital Course:   Darrell [name] is a 34 y.o. 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 Adriana Simas at [facility],??s/p resection and cyst drainage on 06/22/20 with Dr. Ihor Dow 6000 cGy with Dr. 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 On presentation, he had no focal deficits on neuro exam and no symptoms apart from persistent headache. He was started on corticosteroid treatment for reduction of cerebral edema. Our service was consulted, however from our perspective there was no role for surgical intervention. We recommended placement of an Ommaya reservoir which would allow for intermittent aspiration of the cyst. He was seen and consented for surgical intervention after discussing the risks, benefits, and alternatives in full detail.    The patient was taken to the OR on 9/16 for placement of a right Ommaya reservoir.  He tolerated the procedure well, was extubated in the OR, and was transferred to the floor. He did well postoperatively. His diet was slowly advanced and at the time of discharge he was tolerating a regular diet. The patient was able to void spontaneously, have his pain controlled with P.O. pain medication, and returned to the preoperative ambulatory status. He was evaluated by Physical and Occupational Therapy preoperatively and found to have no weekly needs at home.  He will be discharged home on POD1 in stable condition.     Follow-up:  1. 9/29 Follow up with Dr. Flonnie Hailstone of [facility] Radiation Oncology  2. 2 week postop Neurosurgery clinic appointment for wound care with Derrill Memo  3. 6-8 week follow up with Dr. Lynwood Dawley of [facility] Neurosurgery  4. 9/23 follow up with Dr. Dan Europe of [facility] Oncology    Procedures:   1.  Placement of right hemisphere Ommaya reservoir  2.  Stereotactic navigation  ??    Pathology:   n/a    Discharge Day Services:   The patient was seen and evaluated by the Neurosurgery team on the day of discharge and deemed stable for discharge, including stable labs, vital signs, radiographic studies, and neurologic exam.  Discharge instructions were given and explained.  Questions were answered.    Physical Exam:  General: No acute distress  Cardiovascular: Hemodynamically stable  Pulmonary: Breathing is unlabored  Neurologic:   Ox3, conversational  PERRL  EOMI  FS  TML  BUE 5  BLE 5  Incision CDI    _____________________________________________________________________________    Temp:  [35.6 ??C-37.4 ??C] 35.6 ??C  Heart Rate:  [71-108] 75  SpO2 Pulse:  [71-81] 78  Resp:  [9-19] 16  BP: (103-155)/(57-111) 140/79  MAP (mmHg):  [75-122] 103  SpO2:  [  94 %-100 %] 98 %      Condition at Discharge:   Stable  _____________________________________________________________________________  Discharge Medications:     Your Medication List      START taking these medications    dexAMETHasone 4 MG tablet  Commonly known as: DECADRON  Take 2 tablets (8 mg total) by mouth Two (2) times a day.     oxyCODONE 5 MG immediate release tablet  Commonly known as: ROXICODONE  Take 1 tablet (5 mg total) by mouth every four (4) hours as needed for up to 5 days.        CHANGE how you take these medications dronabinoL 10 MG capsule  Commonly known as: MARINOL  Take 1 capsule (10 mg total) by mouth Three (3) times a day before meals for 5 days.  What changed:   ?? medication strength  ?? when to take this     nortriptyline 50 MG capsule  Commonly known as: PAMELOR  Take 2 capsules (100 mg total) by mouth every morning before breakfast.  Start taking on: September 15, 2020  What changed:   ?? how much to take  ?? how to take this  ?? when to take this  ?? additional instructions        CONTINUE taking these medications    acetaminophen 325 MG tablet  Commonly known as: TYLENOL  Take 650 mg by mouth daily as needed for pain.     ALPRAZolam 1 MG tablet  Commonly known as: XANAX  Take 1 mg by mouth two (2) times a day as needed.     baclofen 20 MG tablet  Commonly known as: LIORESAL  Take 20 mg by mouth Three (3) times a day as needed.     butalbital-acetaminophen-caffeine 50-325-40 mg per tablet  Commonly known as: ESGIC  Take 1 tablet by mouth every four (4) hours as needed for pain.     ibuprofen 200 MG tablet  Commonly known as: ADVIL,MOTRIN  Take 600 mg by mouth daily as needed for pain.     levETIRAcetam 1000 MG tablet  Commonly known as: KEPPRA  Take 1 tablet (1,000 mg total) by mouth Two (2) times a day.     magnesium oxide 400 mg (241.3 mg elemental) tablet  Commonly known as: MAGOX  Take 1 tablet (400 mg total) by mouth daily.     ondansetron 8 MG disintegrating tablet  Commonly known as: ZOFRAN-ODT  Dissolve 1 tablet on the tongue 30 minutes before chemo and every 8 hours as needed for nausea.     ondansetron 8 MG tablet  Commonly known as: ZOFRAN  One tab PO 30 minutes prior chemo and q8h PRN nausea     prochlorperazine 10 MG tablet  Commonly known as: COMPAZINE  Take 1 tablet (10 mg total) by mouth Three (3) times a day before meals.     riboflavin (vitamin B2) 100 mg Tab  Take 1 tablet (100 mg total) by mouth daily.     SKIN-PREP SPRAY-ON DRESSING Spra  Generic drug: ostomy supplies  Apply to clean dry skin before wearing Optune device.     SUMAtriptan 50 MG tablet  Commonly known as: IMITREX  Take 1 tablet (50 mg total) by mouth once as needed for migraine. Use only once daily as needed.     temozolomide 250 mg capsule  Commonly known as: TEMODAR  Take 1 (250mg ) capsule with 1 (180mg ) capsule by mouth for 5 consecutive days every 28 days. Take 30  minutes after Zofran at bedtime. Total dose = 430mg .     temozolomide 180 mg capsule  Commonly known as: TEMODAR  Take 1 (180mg ) capsule with 1 (250mg ) capsule by mouth for 5 consecutive days every 28 days. Take 30 minutes after Zofran at bedtime. Total dose = 430mg .     traZODone 50 MG tablet  Commonly known as: DESYREL  TAKE 1/2 TO 1 TABLET(25 TO 50 MG) BY MOUTH EVERY NIGHT AS NEEDED FOR SLEEP     VITAMIN D3 50 mcg (2,000 unit) Cap  Generic drug: cholecalciferol (vitamin D3-50 mcg (2,000 unit))  Take 50 mcg by mouth daily.          _____________________________________________________________________________  Pending Test Results (if blank, then none):      Most Recent Labs:  Microbiology Results (last day)     ** No results found for the last 24 hours. **          Lab Results   Component Value Date    WBC 13.4 (H) 09/14/2020    HGB 14.2 09/14/2020    HCT 43.3 09/14/2020    PLT 230 09/14/2020       Lab Results   Component Value Date    NA 141 09/14/2020    K 3.8 09/14/2020    CL 108 (H) 09/14/2020    CO2 29.0 09/14/2020    BUN 7 (L) 09/14/2020    CREATININE 0.65 (L) 09/14/2020    CALCIUM 8.6 (L) 09/14/2020    MG 2.1 09/13/2020    PHOS 3.7 06/22/2020       _____________________________________________________________________________  Discharge Instructions:   Activity Instructions     Discharge activity      Activity:   Your muscles may be stiff or sore. Walking and moving around will help with both pain and stiffness. You will likely experience more fatigue for several days after surgery due to the medication used to put you to sleep. Try to remain active and awake during the day so that you are tired at bedtime and can get a full night???s rest. Avoid strenuous exercise for the first 2 weeks. Walking is ok. Do not do any bending, twisting, lifting over 10 lbs until cleared by the Neurosurgeon. Avoid any activity that puts you at risk for blows to the head.          Diet Instructions     Discharge Nutrition Therapy      Discharge Nutrition Therapy: Regular    Please continue a regular diet with no restrictions.               Other Instructions     Discharge call MD      Things to look out for:  -Pain- please call the Neurosurgery resident on-call for any worsening pain unrelieved with prescribed medications.  -Fever- If you have a fever over 101.5 F, call the Neurosurgery resident on-call. A low grade fever is normal for some people after surgery.  -Redness, drainage, odor, bleeding- If your incision is red, draining, has a bad odor, or has increasing bleeding, contact the Neurosurgery office during business hours or the Neurosurgery resident on-call after hours. There may be some normal swelling and pink-coloring around the wound. There may also be some fluid under the skin. It can take several weeks to months to be reabsorbed.  -Muscle strength- If an arm or leg becomes weak, call the Neurosurgery resident on     ???Call immediately for the following:  -Bowel and Bladder- If you  have new loss of bowel or bladder control, report straight to the emergency room.  -Mental status- If you or your family notice a change in your mental state from your normal, go straight to the emergency room. A change in mental status includes: increased confusion, trouble waking up, blurry/double vision, severe uncontrolled headaches, vomiting, loss of control of one side of your body, seizures, or other concerning issues.    Important Numbers:  -Neurosurgery resident on-call 24/7: Call [phone] for the [facility] hospital operator. Ask to speak with the ???Neurosurgery resident on-call.???  -Neurosurgery Clinic: [phone] M-F 8am-5pm  -Spine Center: [phone] M-F 8am-5pm  -Neurosurgery Office: [phone] M-F 8am-5pm         Things to look out for:  -Pain- please call the Neurosurgery resident on-call for any worsening pain unrelieved with prescribed medications.  -Fever- If you have a fever over 101.5 F, call the Neurosurgery resident on-call. A low grade fever is normal for some people after surgery.  -Redness, drainage, odor, bleeding- If your incision is red, draining, has a bad odor, or has increasing bleeding, contact the Neurosurgery office during business hours or the Neurosurgery resident on-call after hours. There may be some normal swelling and pink-coloring around the wound. There may also be some fluid under the skin. It can take several weeks to months to be reabsorbed.  -Muscle strength- If an arm or leg becomes weak, call the Neurosurgery resident on     ???Call immediately for the following:  -Bowel and Bladder- If you have new loss of bowel or bladder control, report straight to the emergency room.  -Mental status- If you or your family notice a change in your mental state from your normal, go straight to the emergency room. A change in mental status includes: increased confusion, trouble waking up, blurry/double vision, severe uncontrolled headaches, vomiting, loss of control of one side of your body, seizures, or other concerning issues.    Important Numbers:  -Neurosurgery resident on-call 24/7: Call [phone] for the [facility] hospital operator. Ask to speak with the ???Neurosurgery resident on-call.???  -Neurosurgery Clinic: [phone] M-F 8am-5pm  -Spine Center: [phone] M-F 8am-5pm  -Neurosurgery Office: [phone] M-F 8am-5pm    Discharge dressing      Wound care:   Examine the surgical site at least twice daily. Please change the dressing when wet/dirty until the 3rd day after surgery, then keep the wound open to air. Do not use ointments, creams, gels, peroxide, or blow dryers on the wound. You may shower on the 4th day after surgery. Please shower daily starting on day 4 and use baby shampoo for head incisions to clean the wound. Do not scrub too vigorously. Do not submerge the wound under water (swimming or tub soaking) until 4-6 weeks after surgery. After showering, pat the wound dry and leave open to air. You have an appointment in approximately 2 weeks for a wound check. If you did not receive an appointment while in the hospital, the clinic will contact you will an appointment.         Wound care:   Examine the surgical site at least twice daily. Please change the dressing when wet/dirty until the 3rd day after surgery, then keep the wound open to air. Do not use ointments, creams, gels, peroxide, or blow dryers on the wound. You may shower on the 4th day after surgery. Please shower daily starting on day 4 and use baby shampoo for head incisions to  clean the wound. Do not scrub too vigorously. Do not submerge the wound under water (swimming or tub soaking) until 4-6 weeks after surgery. After showering, pat the wound dry and leave open to air. You have an appointment in approximately 2 weeks for a wound check. If you did not receive an appointment while in the hospital, the clinic will contact you will an appointment.    Discharge instructions      Pain:   You have been prescribed a narcotic pain medication. Do not drive or make critical/important decisions while taking this medication. Narcotic medications may cause constipation. Ensure you have adequate (>25 grams/day) of fiber in your diet and drink at least 64 oz. of water daily. You may also wish to take an over the counter stool softener once or twice daily as needed for constipation while taking narcotic pain medication.         Pain:   You have been prescribed a narcotic pain medication. Do not drive or make critical/important decisions while taking this medication. Narcotic medications may cause constipation. Ensure you have adequate (>25 grams/day) of fiber in your diet and drink at least 64 oz. of water daily. You may also wish to take an over the counter stool softener once or twice daily as needed for constipation while taking narcotic pain medication.    Discharge instructions      You have the following appointments:  1. 9/29 Follow up with Dr. Flonnie Hailstone of [facility] Radiation Oncology  2. 2 week postop Neurosurgery clinic appointment for wound care with Derrill Memo  3. 6-8 week follow up with Dr. Lynwood Dawley of [facility] Neurosurgery  4. Follow up with Dr. Dan Europe of [facility] Oncology    If you do not receive an appointment time within 1 week of discharge for items 2 and 3, please call the neurosurgery clinic at the number listed above.         You have the following appointments:  1. 9/29 Follow up with Dr. Flonnie Hailstone of [facility] Radiation Oncology  2. 2 week postop Neurosurgery clinic appointment for wound care with Derrill Memo  3. 6-8 week follow up with Dr. Lynwood Dawley of [facility] Neurosurgery  4. Follow up with Dr. Dan Europe of [facility] Oncology    If you do not receive an appointment time within 1 week of discharge for items 2 and 3, please call the neurosurgery clinic at the number listed above.          Follow Up instructions and Outpatient Referrals     Discharge instructions      Pain:   You have been prescribed a narcotic pain medication. Do not drive or make critical/important decisions while taking this medication. Narcotic medications may cause constipation. Ensure you have adequate (>25 grams/day) of fiber in your diet and drink at least 64 oz. of water daily. You may also wish to take an over the counter stool softener once or twice daily as needed for constipation while taking narcotic pain medication.    Discharge instructions      You have the following appointments:  1. 9/29 Follow up with Dr. Flonnie Hailstone of [facility] Radiation Oncology  2. 2 week postop Neurosurgery clinic appointment for wound care with Derrill Memo  3. 6-8 week follow up with Dr. Lynwood Dawley of [facility] Neurosurgery  4. Follow up with Dr. Dan Europe of [facility] Oncology    If you do not receive an appointment time within 1 week of discharge for items 2 and 3, please call the neurosurgery  clinic at the number listed above.          Appointments which have been scheduled for you    Sep 19, 2020  1:00 PM  (Arrive by 12:30 PM)  RETURN VIDEO - EPIC AMWELL with Reed Breech, LCSW  [facility] CCSP 2ND [facility] CANCER HOSP [place] [facility] REGION) [address]  [place] [address]  [phone]   Please sign into My [place] Chart at least 15 minutes before your appointment to complete any unfinished steps in the Get Started process. Get Started is required to be complete prior to your video visit.     Please visit [email or web address] for information about our safe promise as you receive care at [facility].    My [place] chart allows you to manage your health, send messages to your provider, view your test results, schedule and manage appointments, and request prescription refills securely and conveniently from your computer or mobile device.    You can go to [email or web address] to Sign in to your My [place] Chart account with your username and password. If you have forgotten them, please choose the Forgot Username? and/or Forgot Password? links to gain access. You can also access your My [place] Chart account with the free MyChart mobile app for Android or iPhone.    If you need further assistance with accessing your My [place] Chart account or for assistance in reaching your provider's office to reschedule or cancel your appointment  call  [facility] at [phone].         Sep 26, 2020 12:15 PM  (Arrive by 12:00 PM)  MRI BRAIN W WO CONTRAST    -UN with IC MRI RM 1  IMG MRI IMAGING CENTER [facility]) [address]  [place] [address]  [phone]   On appt date:  Bring recent lab work  Bring documentation of any metal object implants  Take meds as usual  Check w/physician if diabetic  You will be asked to change into a gown for your safety    On appt date do not:  Consume anything 2 hrs  Wear metallic items including jewelry (we are not responsible for lost items)    Let us know if pt:  Claustrophobic  Metal object implant  Pregnant  Prescribed a sedative  On dialysis  Allergic to MRI dye/contrast  Kidney Failure    (Title:MRIWCNTRST)     Sep 26, 2020  2:00 PM  (Arrive by 1:30 PM)  FOLLOW UP 30 with Colette Buford Dresser, MD  [facility] RADIATION ONCOLOGY [facility] REGION) [address]  [place] [address]  [phone]

## 2020-09-14 NOTE — Unmapped (Signed)
Neurosurgery Operative Note    Preoperative Diagnosis:  Recurrent right brain tumor-associated cyst    Postoperative Diagnosis:  Same    Procedure Performed:  1.  Placement of right hemisphere Ommaya reservoir      2.  Stereotactic navigation    Teaching Surgeon:   Dickey Gave, MD    Resident Surgeon:   Demetria Pore, MD    Specimens:    None    Estimated Blood Loss:  5 mL    Skin Closure:   Vicryl Rapide with skin glue      Complications:   None    Indications for Surgery:  This is a patient with a right temporal lobe glioblastoma who has had recurrent cyst associated with the tumor cavity.  Dr. Laqueta Jean operated on him for similar problem nearly 3 months ago.  He did better after that surgery but now has recurrent symptoms including headache and nausea.  Imaging reveals recurrence of the cystic cavity.  A decision was made to place an Ommaya reservoir to allow for future serial drainage.    Operative Findings:   Brisk flow of fluid through Ommaya reservoir    Procedure Details: The patient was identified and transported to the operating room.  After entering the room and then again prior to incision, timeouts were performed. We verified patient identification, procedure, site and side of surgery as well as antibiotic dosing. The patient was then placed under general anesthesia. All lines were placed and secured by the anesthesiology service.  A 3 pin head holder was applied and the patient was then rotated into the prone position on a standard operating table.  His arms were tucked at his side and all pressure points were padded.    Now in final position, we registered the navigation system using surface matching and facial registration.  Excellent registration was obtained which was confirmed with the use of anatomic landmarks.  The navigation system had been previously loaded with the patient's preoperative CT scan and a surgical plan for the trajectory of the catheter was made.  We used the navigation system now to identify the entry point on the posterior scalp and this was marked.  A linear incision was planned since we are close to the prior operative incision.  The posterior scalp was then prepped and draped in the usual sterile manner.  2 g of intravenous cefazolin were administered.    We started the procedure by opening the linear incision sharply and carried this deep using electrocautery.  We came down onto the cranial surface and used a self-retaining retractor to maintain exposure.  We then created a subgaleal pocket superior to the operative incision that would allow for placement of the reservoir.  The reservoir was now positioned in this location for later connection to the catheter.      The navigation system was used now to identify the predefined entry point and this was marked on the cranial surface.  Now using a standard perforator bit, we placed a bur hole in this location and hemostasis was then established with a combination of bone wax and bipolar electrocautery.    The navigation system was used to align the proper trajectory for the catheter.  The dura was penetrated with electrocautery and we then passed a ventricular catheter into the cystic cavity along the defined trajectory.  There was immediate spontaneous flow of clear/straw-colored fluid emanating from the catheter confirming placement into the cystic cavity.  The catheter was positioned 8 cm at the  dura as determined by the trajectory on the navigation system.  With the catheter in good position, it was cut to fit onto the side-port of the Ommaya reservoir.  This connection was made and secured with a 2-0 silk tie.    We now used a 25-gauge needle through the scalp to access the Ommaya reservoir.  Brisk flow of fluid was obtained and we drained a total of 25 mL from the reservoir.  Working back at the bur hole, we placed a fenestrated burr hole cover over the bur hole and secured this with 2 cranial screws.  A Vicryl tie was placed through the bur hole cover and used to secure the Ommaya reservoir catheter.    The wound was now thoroughly irrigated with saline solution.  The galea was then reapproximated with interrupted 2-0 Vicryl sutures.  The skin was closed with a running Vicryl Rapide followed by skin glue.  The drapes were now taken down and the patient was returned to the supine position.  The 3 pin head holder was removed.  He was awakened and extubated in the operating room then transferred to the recovery room.  All counts were correct at the end of the case.  There were no complications.      Teaching Surgeon Attestation: I was scrubbed and participated in the entire procedure.

## 2020-09-14 NOTE — Unmapped (Signed)
Please schedule this patient for an appointment as noted below.  Please contact the patient as needed/appropriate to alert them of this appointment.    Provider: Derrill Memo, NP and Dickey Gave, MD    Date or Time Frame: 2 weeks with Denny Peon and 6-8 weeks with Lynwood Dawley    Reason for Visit: 2 week wound check, 6-8 week follow-up    Imaging Needed: none

## 2020-09-15 MED ORDER — NORTRIPTYLINE 50 MG CAPSULE
ORAL_CAPSULE | Freq: Every day | ORAL | 0 refills | 30.00 days | Status: CP
Start: 2020-09-15 — End: 2020-10-15

## 2020-09-17 NOTE — Unmapped (Signed)
Called patient to discuss his symptoms.  He reports that he is having more frequent headaches and they are primarily frontally located.  He denies any nausea or vomiting.  He has noted some increased feelings of pressure in his head as well.  He also notes some memory loss such as forgetting what he had for dinner or a meal the day prior.  He also notes that even when he sitting and/or standing he has had some episodes of  feeling off balance.  I had discussed this with Dr. Lynwood Dawley and we agreed that if his symptoms worsened or persisted that we would likely recommend he be evaluated in the emergency room and possible access of his Ommaya reservoir with drainage take place.  Patient reports that he is going to monitor his symptoms for the next day or so and he will come in if he continues with worsening symptoms.Marland Kitchen

## 2020-09-17 NOTE — Unmapped (Signed)
Hi,     Darrell contacted the [employer] requesting to speak with the care team of Darrell [name] to discuss:    Wants to speak to NN about issues he's been having since his surgery on thursday    Please contac at [phone].      Check Indicates criteria has been reviewed and confirmed with the patient:    []  Preferred Name   [x]  DOB and/or MR#  [x]  Preferred Contact Method  [x]  Phone Number(s)   []  MyChart     Thank you,   Vernie Ammons  [facility]   [phone]

## 2020-09-18 ENCOUNTER — Institutional Professional Consult (permissible substitution): Admit: 2020-09-18 | Discharge: 2020-09-19 | Payer: PRIVATE HEALTH INSURANCE | Attending: Clinical | Primary: Clinical

## 2020-09-18 NOTE — Unmapped (Addendum)
[facility] Clinical Assessment & Refill Coordination Note    Pt aware TMZ delivery will be decided after appt on 09/20/20    Darrell [name], DOB: 09-08-1986  Phone: [phone] (home)     All above HIPAA information was verified with patient.     Was a Nurse, learning disability used for this call? No    Specialty Medication(s):   Hematology/Oncology: Temozolomide 250mg  and Temozolomide 180mg      Current Outpatient Medications   Medication Sig Dispense Refill   ??? acetaminophen (TYLENOL) 325 MG tablet Take 650 mg by mouth daily as needed for pain.     ??? ALPRAZolam (XANAX) 1 MG tablet Take 1 mg by mouth two (2) times a day as needed.      ??? baclofen (LIORESAL) 20 MG tablet Take 20 mg by mouth Three (3) times a day as needed.      ??? butalbital-acetaminophen-caffeine (ESGIC) 50-325-40 mg per tablet Take 1 tablet by mouth every four (4) hours as needed for pain. 120 tablet 3   ??? cholecalciferol, vitamin D3-50 mcg, 2,000 unit,, (VITAMIN D3) 50 mcg (2,000 unit) cap Take 50 mcg by mouth daily.     ??? dexAMETHasone (DECADRON) 4 MG tablet Take 2 tablets (8 mg total) by mouth Two (2) times a day. 120 tablet 0   ??? dronabinoL (MARINOL) 10 MG capsule Take 1 capsule (10 mg total) by mouth Three (3) times a day before meals for 5 days. 15 capsule 0   ??? ibuprofen (ADVIL,MOTRIN) 200 MG tablet Take 600 mg by mouth daily as needed for pain.     ??? levETIRAcetam (KEPPRA) 1000 MG tablet Take 1 tablet (1,000 mg total) by mouth Two (2) times a day. 180 tablet 3   ??? magnesium oxide (MAG-OX) 400 mg (241.3 mg elemental magnesium) tablet Take 1 tablet (400 mg total) by mouth daily. 200 tablet 1   ??? nortriptyline (PAMELOR) 50 MG capsule Take 2 capsules (100 mg total) by mouth every morning before breakfast. 60 capsule 0   ??? ondansetron (ZOFRAN) 8 MG tablet One tab PO 30 minutes prior chemo and q8h PRN nausea 60 tablet 2   ??? ondansetron (ZOFRAN-ODT) 8 MG disintegrating tablet Dissolve 1 tablet on the tongue 30 minutes before chemo and every 8 hours as needed for nausea. 60 tablet 10   ??? ostomy supplies (SKIN-PREP SPRAY-ON DRESSING) SprA Apply to clean dry skin before wearing Optune device. 118 mL 0   ??? oxyCODONE (ROXICODONE) 5 MG immediate release tablet Take 1 tablet (5 mg total) by mouth every four (4) hours as needed for up to 5 days. 30 tablet 0   ??? prochlorperazine (COMPAZINE) 10 MG tablet Take 1 tablet (10 mg total) by mouth Three (3) times a day before meals. 90 tablet 3   ??? riboflavin, vitamin B2, 100 mg Tab Take 1 tablet (100 mg total) by mouth daily. 90 tablet 3   ??? SUMAtriptan (IMITREX) 50 MG tablet Take 1 tablet (50 mg total) by mouth once as needed for migraine. Use only once daily as needed. 30 tablet 2   ??? temozolomide (TEMODAR) 180 mg capsule Take 1 (180mg ) capsule with 1 (250mg ) capsule by mouth for 5 consecutive days every 28 days. Take 30 minutes after Zofran at bedtime. Total dose = 430mg . 5 capsule 1   ??? temozolomide (TEMODAR) 250 mg capsule Take 1 (250mg ) capsule with 1 (180mg ) capsule by mouth for 5 consecutive days every 28 days. Take 30 minutes after Zofran at bedtime.  Total dose = 430mg . 5 capsule 1   ??? traZODone (DESYREL) 50 MG tablet TAKE 1/2 TO 1 TABLET(25 TO 50 MG) BY MOUTH EVERY NIGHT AS NEEDED FOR SLEEP 30 tablet 1     No current facility-administered medications for this visit.        Changes to medications: Darrell reports no changes at this time.    No Known Allergies    Changes to allergies: No    SPECIALTY MEDICATION ADHERENCE     TMZ 250 mg: 0 days of medicine on hand   TMZ 180 mg: 0 days of medicine on hand     Medication Adherence    Patient reported X missed doses in the last month: 0  Specialty Medication: TMZ 250 mg and 180 mg - take 430 mg qhs x 5 nights  Patient is on additional specialty medications: No  Informant: patient  Confirmed plan for next specialty medication refill: delivery by pharmacy          Specialty medication(s) dose(s) confirmed: Regimen is correct and unchanged.     Are there any concerns with adherence? No    Adherence counseling provided? Not needed    CLINICAL MANAGEMENT AND INTERVENTION      Clinical Benefit Assessment:    Do you feel the medicine is effective or helping your condition? Yes    Clinical Benefit counseling provided? Not needed    Adverse Effects Assessment:    Are you experiencing any side effects? No    Are you experiencing difficulty administering your medicine? No    Quality of Life Assessment:    How many days over the past month did your glioblastoma  keep you from your normal activities? For example, brushing your teeth or getting up in the morning. 0    Have you discussed this with your provider? Not needed    Therapy Appropriateness:    Is therapy appropriate? Yes, therapy is appropriate and should be continued    DISEASE/MEDICATION-SPECIFIC INFORMATION      N/A    PATIENT SPECIFIC NEEDS     - Does the patient have any physical, cognitive, or cultural barriers? No    - Is the patient high risk? Yes, patient is taking oral chemotherapy. Appropriateness of therapy as been assessed    - Does the patient require a Care Management Plan? No     - Does the patient require physician intervention or other additional services (i.e. nutrition, smoking cessation, social work)? No      SHIPPING     Specialty Medication(s) to be Shipped:   Hematology/Oncology: Temozolomide 250mg  and Temozolomide 180mg     Other medication(s) to be shipped: ondansetron     Changes to insurance: No    Delivery Scheduled: Yes, Expected medication delivery date: TBD after FU on 09/23.     Medication will be delivered via Same Day Courier to the confirmed prescription address in [facility].    The patient will receive a drug information handout for each medication shipped and additional FDA Medication Guides as required.  Verified that patient has previously received a Conservation officer, historic buildings.    All of the patient's questions and concerns have been addressed.    Breck Coons Shared [facility] Specialty Pharmacist

## 2020-09-19 NOTE — Unmapped (Unsigned)
[facility]  Comprehensive Cancer Support Program   Telehealth Encounter      Encounter Description: This encounter was conducted via telephone in the setting of State of Emergency due to COVID-19 Pandemic. {    Coding tips - Do not edit this text, it will delete upon signing of note!    ?? Telephone visits [phone] for Physicians and APP??s and [phone] for Non- Physician Clinicians)- Only use minutes on the phone to determine level of service.    ?? Video visits [phone]) - Use both minutes on video and pre/post minutes to determine level of service.       :[id number]}    I spent 20 minutes on the phone with the patient on the date of service. I spent an additional 10 minutes on pre- and post-visit activities on the date of service.     The patient was physically located in [place] or a state in which I am permitted to provide care. The patient and/or parent/guardian understood that s/he may incur co-pays and cost sharing, and agreed to the telemedicine visit. The visit was reasonable and appropriate under the circumstances given the patient's presentation at the time.    The patient and/or parent/guardian has been advised of the potential risks and limitations of this mode of treatment (including, but not limited to, the absence of in-person examination) and has agreed to be treated using telemedicine. The patient's/patient's family's questions regarding telemedicine have been answered.     If the visit was completed in an ambulatory setting, the patient and/or parent/guardian has also been advised to contact their provider???s office for worsening conditions, and seek emergency medical treatment and/or call 911 if the patient deems either necessary.    Assessment:  Darrell [name] is a 34 y.o. 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 Currently on oral chemo. Referred to [facility] for support around adjustment to diagnosis.       Risk Assessment:  A suicide and violence risk assessment was performed as part of this evaluation. There patient is deemed to be at chronic elevated risk for self-harm/suicide given the following factors: male age 63-35 and recent onset of serious medical condition. There patient is deemed to be at chronic elevated risk for violence given the following factors: male gender and younger age. These risk factors are mitigated by the following factors:lack of active SI/HI, no history of previous suicide attempts , utilization of positive coping skills, supportive family, sense of responsibility to family and social supports, minor children living at home, presence of an available support system, enjoyment of leisure actvities, expresses purpose for living, effective problem solving skills, safe housing and support system in agreement with treatment recommendations. There is no acute risk for suicide or violence at this time. The patient was educated about relevant modifiable risk factors including following recommendations for treatment of psychiatric illness and abstaining from substance abuse. While future psychiatric events cannot be accurately predicted, the patient does not currently require  acute inpatient psychiatric care and does not currently meet [facility] 352 involuntary commitment criteria.        Plan:  Will continue to follow for supportive counseling. Appt scheduled 10/7.    Subjective:   Patient interviewed in a private place, accompanied by no one. Reports that he has been dealing with headaches since his surgery. Has been in touch with medical providers, but medication doesn't seem to be helping much at this time. Is focused on continuing treatment and  doing everything I can. Denies depression. Due to current pain discussed rescheduling to a time after pt's next MRI, which he was in agreement with.      Objective:    Mental Status Exam:  Speech/Language:    Normal rate, volume, tone, fluency   Mood:   euthymic   Thought process and Associations:   Logical, linear, clear, coherent, goal directed   Abnormal/psychotic thought content:     Denies SI, HI, self harm, delusions, obsessions, paranoid ideation, or ideas of reference   Perceptual disturbances:     Does not endorse auditory or visual hallucinations     Orientation:   Oriented to person, place, time, and general circumstances   Insight:     Fair   Judgment:    Fair   Impulse Control:   Fair     Frederik Schmidt, LCSW  Comprehensive Cancer Support Program  Phone: [phone]  Pager: [phone]

## 2020-09-20 ENCOUNTER — Encounter: Admit: 2020-09-20 | Discharge: 2020-09-20 | Discharge status: Against Medical Advice | Payer: PRIVATE HEALTH INSURANCE

## 2020-09-20 DIAGNOSIS — C719 Malignant neoplasm of brain, unspecified: Principal | ICD-10-CM

## 2020-09-20 LAB — COMPREHENSIVE METABOLIC PANEL
ALBUMIN: 3.3 g/dL — ABNORMAL LOW (ref 3.4–5.0)
ALKALINE PHOSPHATASE: 37 U/L — ABNORMAL LOW (ref 46–116)
ALT (SGPT): 71 U/L — ABNORMAL HIGH (ref 10–49)
ANION GAP: 4 mmol/L — ABNORMAL LOW (ref 5–14)
AST (SGOT): 21 U/L (ref <=34)
BILIRUBIN TOTAL: 0.5 mg/dL (ref 0.3–1.2)
BLOOD UREA NITROGEN: 10 mg/dL (ref 9–23)
BUN / CREAT RATIO: 14
CALCIUM: 9.1 mg/dL (ref 8.7–10.4)
CHLORIDE: 106 mmol/L (ref 98–107)
CO2: 28 mmol/L (ref 20.0–31.0)
CREATININE: 0.73 mg/dL
EGFR CKD-EPI AA MALE: >90 mL/min/{1.73_m2} (ref >=60)
EGFR CKD-EPI NON-AA MALE: >90 mL/min/{1.73_m2} (ref >=60)
POTASSIUM: 4.1 mmol/L (ref 3.4–4.5)
PROTEIN TOTAL: 6.6 g/dL (ref 5.7–8.2)

## 2020-09-20 LAB — CBC W/ AUTO DIFF
BASOPHILS ABSOLUTE COUNT: 0 10*9/L (ref 0.0–0.1)
BASOPHILS RELATIVE PERCENT: 0.1 %
EOSINOPHILS RELATIVE PERCENT: 0.4 %
HEMOGLOBIN: 15.9 g/dL (ref 13.5–17.5)
LARGE UNSTAINED CELLS: 2 % (ref 0–4)
LYMPHOCYTES ABSOLUTE COUNT: 1.9 10*9/L (ref 1.5–5.0)
LYMPHOCYTES RELATIVE PERCENT: 9.7 %
MEAN CORPUSCULAR HEMOGLOBIN CONC: 33.3 g/dL (ref 31.0–37.0)
MEAN CORPUSCULAR HEMOGLOBIN: 33 pg (ref 26.0–34.0)
MEAN CORPUSCULAR VOLUME: 99.1 fL (ref 80.0–100.0)
MEAN PLATELET VOLUME: 8.9 fL (ref 7.0–10.0)
MONOCYTES ABSOLUTE COUNT: 1.2 10*9/L — ABNORMAL HIGH (ref 0.2–0.8)
MONOCYTES RELATIVE PERCENT: 6.4 %
NEUTROPHILS RELATIVE PERCENT: 81.9 %
PLATELET COUNT: 304 10*9/L (ref 150–440)
RED BLOOD CELL COUNT: 4.82 10*12/L (ref 4.50–5.90)
RED CELL DISTRIBUTION WIDTH: 12.8 % (ref 12.0–15.0)
WBC ADJUSTED: 19.2 10*9/L — ABNORMAL HIGH (ref 4.5–11.0)

## 2020-09-20 LAB — INR: Coagulation tissue factor induced.INR:RelTime:Pt:PPP:Qn:Coag: 0.8

## 2020-09-20 LAB — GLUCOSE RANDOM: Glucose:MCnc:Pt:Ser/Plas:Qn:: 75

## 2020-09-20 LAB — RED BLOOD CELL COUNT: Lab: 4.82

## 2020-09-20 LAB — APTT: Coagulation surface induced:Time:Pt:PPP:Qn:Coag: 23 — ABNORMAL LOW

## 2020-09-20 NOTE — Unmapped (Signed)
Memory loss, blurry vision, headaches since March. S/p brain tumor resection. Sx have been getting worse. Denies fever or n/v. States here for CT scan. Not currently on chemo or radiation. NAD noted

## 2020-09-20 NOTE — Unmapped (Addendum)
Dear Darrell [name],    It was a pleasure meeting you today. We discussed your imaging results from 09-10-20 and the following:    You will go the the ED today for repeat imaging and Omaya access, pending results.    We will plan to taper your steroids once your symptoms have improved; please stay on this dose.     We briefly discussed one second line chemotherapy option of avastin plus lomustine, but we will discuss more after your ED visit.   Additionally, we will refer you to [facility] for clinical trial consideration.    Your case will be reviewed at tumor board tomorrow.    MRI 09-10-20 impression:  Significant increase in size of the postoperative right temporal lobe glioblastoma as compared to 07/09/2020, with marked increased size of the cystic/necrotic component as well as increased nodular enhancement along its posterior superomedial margin extending along the ependyma of the right lateral ventricle. Associated increased edema and mass effect results in new leftward midline midline shift of 2 cm and compression of the adjacent brainstem.  ??    Your team:  Medical Oncologist: Dr. Penni Bombard. Marcey Persad  Pharmacist: Girtha Rm  Nurse Navigator: Purcell Mouton    Clinic Information:  For your safety and best care, please DO NOT use MyChart messages to report symptoms. Symptoms should be reported by calling the nurse triage line [phone] from 8:00am-5:00 pm or by calling [phone] nights after 5:00 pm, weekends, and holidays and asking for our Oncology Fellow on-call.   Please use MyChart for:   -non-urgent prescription refills   -non-urgent scheduling issues  -non-urgent general questions       Please DO NOT use MyChart for URGENT messages, as messages are only checked during weekday regular business hours. You should receive a response from our care team within 2 business days.  Please note that MyChart messages may be routed a central pool and one of your provider's team members will get back to you.    Fax: [phone]    Nurse triage: [phone]     After hours and on the weekends, please contact the Medical Oncologist on call [phone].

## 2020-09-20 NOTE — Unmapped (Signed)
Patients brother contacted the [facility] Communication [facility] today to discuss his brother has been in the ER waiting for 2 hours to have a ct scan done on his skull and he wanted to schedule one for tomorrow but, there was no order put in the system for the scan to be scheduled. Please contact patient back urgently at [phone].       .Thank you,   Noland Fordyce  [facility]  [phone]

## 2020-09-20 NOTE — Unmapped (Signed)
Referral order/facesheet/progress note faxed to [facility].

## 2020-09-20 NOTE — Unmapped (Signed)
ED transfer note    Darrell [name] is a 34 year old male with progressive GMB with large cystic component, s/p MRI 09-10-20 with new midline shift and tumor/cyst progression. He was hospitalized and underwent omaya placement; CT head non contrast 09-14-20 prior to discharge showed slight improvement in midline shift.    He presents to clinic today for follow up. He is accompanied by his brother and reports worsening headaches, tenderness at the [facility] site while lying down, increased sensation of imbalance while standing (no falls), increased difficulty with verbal processing and short term memory since discharge, increased bilateral eye blurring    Denies: fevers, chills, CP, SOB, cough, abdominal pain, nausea, emesis, abdominal pain, nausea, emesis.    PE:  Vitals:    09/20/20 1241   BP: 131/98   Pulse: 118   Resp: 18   Temp: 36.7 ??C (98.1 ??F)   SpO2: 100%   ECOG PS 2  GEN: NAD, AO x 3  HEENT: well healing scalp incision without erythema or drainage  PULM: CTAB  CV: RRR  ABD: soft, nttp, nbs, no HSM  EXT: no edema      Labs:  Lab Results   Component Value Date    WBC 19.2 (H) 09/20/2020    HGB 15.9 09/20/2020    HCT 47.8 09/20/2020    PLT 304 09/20/2020       Lab Results   Component Value Date    NA 138 09/20/2020    K 4.1 09/20/2020    CL 106 09/20/2020    CO2 28.0 09/20/2020    BUN 10 09/20/2020    CREATININE 0.73 09/20/2020    GLU 75 09/20/2020    CALCIUM 9.1 09/20/2020    MG 2.1 09/13/2020    PHOS 3.7 06/22/2020       Lab Results   Component Value Date    BILITOT 0.5 09/20/2020    BILIDIR 0.40 03/15/2020    PROT 6.6 09/20/2020    ALBUMIN 3.3 (L) 09/20/2020    ALT 71 (H) 09/20/2020    AST 21 09/20/2020    ALKPHOS 37 (L) 09/20/2020       Lab Results   Component Value Date    PT 10.1 (L) 09/13/2020    INR 0.86 09/13/2020    APTT 24.0 (L) 09/13/2020     Plan:  Transfer to ED for imaging and neurosurgery consult, as he may need Omaya accessed and cyst drained    Darrell [name] is contacting ED charge nurse    I briefly discussed the following:   -present at tumor board tomorrow to confirm whether neurosurgery is planning more operative interventions. If more operative intervention is planned, delay avastin  - steroid taper once his symptoms have improved after this ED visit  - second line options - clinical trial ([facility] referral placed) versus initiation of avastin + lomustine 3-4 weeks post Omaya placement per [facility] trial. As he did not complete full temodar dosing, he may have less risk of myelosupression, and may tolerate higher lomustine dose. We will discuss more in the next two weeks.     Care Team:  Oncology - Octaviano Glow, Girtha Rm, Purcell Mouton, [facility]    Neurosurgery - [place], [place]

## 2020-09-20 NOTE — Unmapped (Signed)
Patient stated that he has been sitting in [facility] ED for over 2 hours waiting to have a CT scan. Darrell [name] stated that he would rather come back tomorrow to complete CT scan and would like a call back from [employer].     He can be reached on his brother's cell phone at [phone]

## 2020-09-21 ENCOUNTER — Other Ambulatory Visit: Admit: 2020-09-21 | Discharge: 2020-09-22 | Payer: PRIVATE HEALTH INSURANCE

## 2020-09-21 ENCOUNTER — Encounter: Admit: 2020-09-21 | Discharge: 2020-09-22 | Payer: PRIVATE HEALTH INSURANCE

## 2020-09-21 ENCOUNTER — Ambulatory Visit
Admit: 2020-09-20 | Discharge: 2020-09-21 | Payer: PRIVATE HEALTH INSURANCE | Attending: Internal Medicine | Primary: Internal Medicine

## 2020-09-21 ENCOUNTER — Ambulatory Visit: Admit: 2020-09-20 | Discharge: 2020-09-21 | Payer: PRIVATE HEALTH INSURANCE

## 2020-09-21 LAB — CBC W/ AUTO DIFF
BASOPHILS ABSOLUTE COUNT: 0 10*9/L (ref 0.0–0.1)
BASOPHILS RELATIVE PERCENT: 0.1 %
EOSINOPHILS ABSOLUTE COUNT: 0 10*9/L (ref 0.0–0.4)
EOSINOPHILS RELATIVE PERCENT: 0.2 %
HEMATOCRIT: 46.7 % (ref 41.0–53.0)
HEMOGLOBIN: 16 g/dL (ref 13.5–17.5)
LARGE UNSTAINED CELLS: 0 % (ref 0–4)
LYMPHOCYTES ABSOLUTE COUNT: 0.8 10*9/L — ABNORMAL LOW (ref 1.5–5.0)
LYMPHOCYTES RELATIVE PERCENT: 5.8 %
MEAN CORPUSCULAR HEMOGLOBIN: 34 pg (ref 26.0–34.0)
MEAN CORPUSCULAR VOLUME: 99.2 fL (ref 80.0–100.0)
MEAN PLATELET VOLUME: 9.3 fL (ref 7.0–10.0)
MONOCYTES ABSOLUTE COUNT: 0.3 10*9/L (ref 0.2–0.8)
MONOCYTES RELATIVE PERCENT: 2.2 %
NEUTROPHILS ABSOLUTE COUNT: 13.3 10*9/L — ABNORMAL HIGH (ref 2.0–7.5)
NEUTROPHILS RELATIVE PERCENT: 91.3 %
PLATELET COUNT: 284 10*9/L (ref 150–440)
RED BLOOD CELL COUNT: 4.71 10*12/L (ref 4.50–5.90)
RED CELL DISTRIBUTION WIDTH: 12.4 % (ref 12.0–15.0)
WBC ADJUSTED: 14.6 10*9/L — ABNORMAL HIGH (ref 4.5–11.0)

## 2020-09-21 LAB — BASIC METABOLIC PANEL
ANION GAP: 6 mmol/L (ref 5–14)
ANION GAP: 8 mmol/L (ref 5–14)
BLOOD UREA NITROGEN: 10 mg/dL (ref 9–23)
BLOOD UREA NITROGEN: 8 mg/dL — ABNORMAL LOW (ref 9–23)
BUN / CREAT RATIO: 12
CALCIUM: 8.6 mg/dL — ABNORMAL LOW (ref 8.7–10.4)
CHLORIDE: 106 mmol/L (ref 98–107)
CHLORIDE: 103 mmol/L (ref 98–107)
CO2: 26 mmol/L (ref 20.0–31.0)
CREATININE: 0.8 mg/dL
CREATININE: 0.67 mg/dL — ABNORMAL LOW
EGFR CKD-EPI AA MALE: >90 mL/min/{1.73_m2} (ref >=60)
EGFR CKD-EPI NON-AA MALE: >90 mL/min/{1.73_m2} (ref >=60)
EGFR CKD-EPI NON-AA MALE: >90 mL/min/{1.73_m2} (ref >=60)
GLUCOSE RANDOM: 106 mg/dL (ref 70–179)
POTASSIUM: 4.4 mmol/L (ref 3.4–4.5)
POTASSIUM: 3.8 mmol/L (ref 3.4–4.5)
SODIUM: 138 mmol/L (ref 135–145)
SODIUM: 136 mmol/L (ref 135–145)

## 2020-09-21 LAB — CBC
MEAN CORPUSCULAR HEMOGLOBIN CONC: 33.5 g/dL (ref 31.0–37.0)
MEAN CORPUSCULAR HEMOGLOBIN: 33.2 pg (ref 26.0–34.0)
MEAN CORPUSCULAR VOLUME: 99.2 fL (ref 80.0–100.0)
MEAN PLATELET VOLUME: 8.7 fL (ref 7.0–10.0)
PLATELET COUNT: 318 10*9/L (ref 150–440)
RED BLOOD CELL COUNT: 4.53 10*12/L (ref 4.50–5.90)
RED CELL DISTRIBUTION WIDTH: 12.6 % (ref 12.0–15.0)
WBC ADJUSTED: 17 10*9/L — ABNORMAL HIGH (ref 4.5–11.0)

## 2020-09-21 LAB — PROTIME
Coagulation tissue factor induced:Time:Pt:PPP:Qn:Coag: 9.4 — ABNORMAL LOW
Coagulation tissue factor induced:Time:Pt:PPP:Qn:Coag: 9.7 — ABNORMAL LOW

## 2020-09-21 LAB — PROTIME-INR
INR: 0.8
INR: 0.83

## 2020-09-21 LAB — GLUCOSE RANDOM: Glucose:MCnc:Pt:Ser/Plas:Qn:: 139

## 2020-09-21 LAB — MEAN CORPUSCULAR VOLUME: Erythrocyte mean corpuscular volume:EntVol:Pt:RBC:Qn:Automated count: 99.2

## 2020-09-21 LAB — BUN / CREAT RATIO: Urea nitrogen/Creatinine:MRto:Pt:Ser/Plas:Qn:: 12

## 2020-09-21 LAB — HEPARIN CORRELATION: Lab: <0.2

## 2020-09-21 LAB — PLATELET COUNT: Platelets:NCnc:Pt:Bld:Qn:Automated count: 318

## 2020-09-21 LAB — MAGNESIUM: Magnesium:MCnc:Pt:Ser/Plas:Qn:: 2.1

## 2020-09-21 MED ADMIN — MORPhine injection 2 mg: 2 mg | INTRAVENOUS | @ 20:00:00 | Stop: 2020-10-05

## 2020-09-21 MED ADMIN — diphenhydrAMINE (BENADRYL) injection 25 mg: 25 mg | INTRAVENOUS | @ 15:00:00 | Stop: 2020-09-21

## 2020-09-21 MED ADMIN — levETIRAcetam (KEPPRA) tablet 1,000 mg: 1000 mg | ORAL | @ 12:00:00 | Stop: 2020-09-21

## 2020-09-21 MED ADMIN — gadobenate dimeglumine (MULTIHANCE) 529 mg/mL (0.1mmol/0.2mL) solution 18.42 mL: .1 mmol/kg | INTRAVENOUS | @ 19:00:00 | Stop: 2020-09-21

## 2020-09-21 MED ADMIN — HYDROmorphone (PF) (DILAUDID) injection 0.5 mg: .5 mg | INTRAVENOUS | @ 15:00:00 | Stop: 2020-09-21

## 2020-09-21 MED ADMIN — MORPhine 4 mg/mL injection 4 mg: 4 mg | INTRAVENOUS | @ 06:00:00 | Stop: 2020-09-21

## 2020-09-21 MED ADMIN — nortriptyline (PAMELOR) capsule 100 mg: 100 mg | ORAL | @ 19:00:00

## 2020-09-21 MED ADMIN — oxyCODONE (ROXICODONE) immediate release tablet 10 mg: 10 mg | ORAL | @ 12:00:00 | Stop: 2020-09-21

## 2020-09-21 MED ADMIN — cholecalciferol (vitamin D3 25 mcg (1,000 units)) tablet 50 mcg: 50 ug | ORAL | @ 15:00:00

## 2020-09-21 MED ADMIN — prochlorperazine (COMPAZINE) injection 10 mg: 10 mg | INTRAVENOUS | @ 15:00:00 | Stop: 2020-09-21

## 2020-09-21 MED ADMIN — ondansetron (ZOFRAN) injection 4 mg: 4 mg | INTRAVENOUS | @ 08:00:00 | Stop: 2020-09-21

## 2020-09-21 MED ADMIN — MORPhine injection 8 mg: 8 mg | INTRAVENOUS | @ 08:00:00 | Stop: 2020-09-21

## 2020-09-21 MED ADMIN — magnesium sulfate in D5W 1 gram/100 mL infusion 1 g: 1 g | INTRAVENOUS | @ 19:00:00 | Stop: 2020-09-21

## 2020-09-21 MED ADMIN — dronabinoL (MARINOL) capsule 10 mg: 10 mg | ORAL | @ 20:00:00

## 2020-09-21 NOTE — Unmapped (Signed)
NEUROSURGERY CONSULT NOTE    Requesting Attending Physician:  Marney Setting, MD  Service Requesting Consult:  Emergency Medicine    Assessment and Recommendations  Darrell [name] is a 34 y.o. 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 Patient neurologically is stable but does have worsening headaches and nausea. CT head shows mild enlargement of cystic space. We will plan to complete a reservoir tap, large volume, to see if he has symptom relief. We also appreciate oncology/rad onc input for recommendations for further pain control and treatment work up/follow up.     Patient was discussed with Olean Ree, MD and staffed with Dorthea Cove, MD. Please page the Neurosurgery consult pager at [phone] with questions/concerns.     Problem List  Active Problems:    * No active hospital problems. *  Resolved Problems:    * No resolved hospital problems. *      History of Present Illness  Darrell [name] is a 34 y.o. 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 Adriana Simas at [facility],??s/p resection and cyst drainage on 06/22/20 with Dr. Ihor Dow 6000 cGy with Dr. 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 He represented with worsening headaches, starting on right side of head, and becomes throbbing and unbearable. He endorses nausea, but does not have vomiting, lethargy, or weakness.     Review of Systems  A 10-system review of systems was conducted and was negative except as documented above in the HPI.  ______________________________________________________________________    Physical Exam  General: No acute distress; There is no height or weight on file to calculate BMI.    Neurological Exam:  Eyes open spontaneously  Oriented to name, location, time  Pupils equal and reactive bilaterally  Extraocular movements intact bilaterally  Face symmetric  Motor strength 5/5 x 4  No pronator drift  Sensation to light touch intact throughout    Cardiovascular: Warm and well perfused with good pulses, no edema  Pulmonary: Unlabored breathing, no pursed lips or wheezing, acyanotic  Abdomen: Soft, nontender, nondistended    Neurological imaging reviewed  CT Head: slight enlargement of cystic component of right temporal lobe GBM. Ommaya catheter in place.     The patient's available vitals, intake/output, medications, labs, and relevant neurological imaging were independently reviewed.    ---Historical data---    Allergies  Patient has no known allergies.    Medications  Reviewed in Epic.  Medications relevant to this consult listed below.    Past Medical History  Past Medical History:   Diagnosis Date   ??? Cancer (CMS-HCC)    ??? Migraine        Past Surgical History  Past Surgical History:   Procedure Laterality Date   ??? PR BURR HOLE IMPLANT CATH/DEVICE N/A 09/13/2020    Procedure: BURR HOLE(S); FOR IMPLANT VENTRICULAR CATH, RESERV, EEG ELECT, PRESSR RECORD DEVICE, OTHER CEREBRAL MONITOR;  Surgeon: Odie Sera, MD;  Location: MAIN OR [facility];  Service: Neurosurgery   ??? PR EXCIS SUPRATENT BRAIN TUMOR Right 06/22/2020    Procedure: CRANIECTOMY; EXC BRAIN TUMOR-SUPRATENTORIAL;  Surgeon: Corlis Leak, MD;  Location: MAIN OR  [facility];  Service: Neurosurgery   ??? PR INSERT SUBCUT RESERV/PUMP/DEV N/A 09/13/2020    Procedure: INSERTION OF SUBCUTANEOUS RESERVOIR, PUMP OR CONTINOUS INFUSION SYSTEM FOR CONNECTION TO VENTRICULAR CATH;  Surgeon: Odie Sera, MD;  Location: MAIN OR [facility];  Service: Neurosurgery   ??? PR MICROSURG TECHNIQUES,REQ OPER MICROSCOPE Right 06/22/2020    Procedure: MICROSURGICAL TECHNIQUES, REQUIRING USE OF OPERATING MICROSCOPE (LIST SEPARATELY IN ADDITION TO CODE FOR PRIMARY PROCEDURE);  Surgeon: Corlis Leak, MD;  Location: MAIN OR [facility];  Service: Neurosurgery   ??? PR STEREOTACTIC COMP ASSIST PROC,CRANIAL,INTRADURAL N/A 06/22/2020    Procedure: STEREOTACTIC COMPUTER-ASSISTED (NAVIGATIONAL) PROCEDURE; CRANIAL, INTRADURAL;  Surgeon: Corlis Leak, MD;  Location: MAIN OR [facility];  Service: Neurosurgery   ??? PR STEREOTACTIC COMP ASSIST PROC,CRANIAL,INTRADURAL N/A 09/13/2020    Procedure: STEREOTACTIC COMPUTER-ASSISTED (NAVIGATIONAL) PROCEDURE; CRANIAL, INTRADURAL;  Surgeon: Odie Sera, MD;  Location: MAIN OR [facility];  Service: Neurosurgery       Family History  Family History   Problem Relation Age of Onset   ??? No Known Problems Mother    ??? No Known Problems Father    ??? No Known Problems Sister    ??? No Known Problems Brother    ??? No Known Problems Maternal Aunt    ??? No Known Problems Maternal Uncle    ??? No Known Problems Paternal Aunt    ??? No Known Problems Paternal Uncle    ??? No Known Problems Maternal Grandmother    ??? No Known Problems Maternal Grandfather    ??? No Known Problems Paternal Grandmother    ??? No Known Problems Paternal Grandfather    ??? No Known Problems Other    ??? Amblyopia Neg Hx    ??? Blindness Neg Hx    ??? Cancer Neg Hx    ??? Cataracts Neg Hx    ??? Diabetes Neg Hx    ??? Glaucoma Neg Hx    ??? Hypertension Neg Hx    ??? Macular degeneration Neg Hx    ??? Retinal detachment Neg Hx    ??? Strabismus Neg Hx    ??? Stroke Neg Hx    ??? Thyroid disease Neg Hx    ??? Anesthesia problems Neg Hx        Social History  Social History     Tobacco Use   ??? Smoking status: Current Every Day Smoker     Packs/day: 0.40     Years: 10.00     Pack years: 4.00     Types: Cigarettes   ??? Smokeless tobacco: Never Used   Vaping Use   ??? Vaping Use: Every day   Substance Use Topics   ??? Alcohol use: Yes   ??? Drug use: Never

## 2020-09-21 NOTE — Unmapped (Addendum)
Darrell [name] is a 34 y.o. 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 Adriana Simas at [facility],??s/p resection and cyst drainage on 06/22/20 with Dr. Ihor Dow 6000 cGy with Dr. 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     He is presenting for worsening headaches.  At baseline he has dull headaches that are helped with nortriptyline.  He still headaches are all over.  He also is experiencing sharp frontal headaches that have been worsening over the past few days.  His headaches first began in March when he was diagnosed with brain cancer.  Over these past few days he describes a sharp frontal headache pain is worsening and that it is more constant and that the pain is worse.  He denies anything that triggers it such as light or food and does not feel like it is a migraine pain.  The pain is very intense and does not find that anything is helping.  He states Percocet does not help but Vicodin has helped in the past since it last longer and when getting morphine in the ED it provides some relief.  He had been prescribed Fioricet but states it is not helping either.  He is unsure if the headache cocktail has helped in the past.  He has some nausea when the headache is hurting but denies vomiting and confirms a good appetite and staying hydrated.  He denies dizziness lightheadedness weakness falls vision changes loss of sensation.  He has mid back pain near his scapula and neck pain.  Denies any fevers.  He eats marijuana Gummies to help him relax.  He drinks a couple of alcoholic beverages once a week.  Instruments half a pack of cigarettes daily.Marland Kitchen    His CT head showed enlargement of cystic space, he underwent a reservoir tap with large volume for symptomatic relief in the ED with neurosurgery. Following this his headache has persisted, he states his dull headache improved but the sharp frontal headache is still bothering him.     Headaches      Right frontotemporal glioblastoma: s/p gross total resection on 03/21/20 by Dr. Adriana Simas at [facility],??s/p resection and cyst drainage on 06/22/20 with Dr. Ihor Dow 6000 cGy with Dr. 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

## 2020-09-21 NOTE — Unmapped (Signed)
Darrell [name]    Performing Service: Neurosurgery  Patient Location: ED  Indications: Cyst decompression  Procedure:  Darrell interrogation  Consent: Informed consent was obtained prior to the procedure after explanation of the risks and benefits.   Prior to the start of the procedure, a time out was taken and the identity of the patient was confirmed via name, medical record number and  date of birth.  The availability of the correct equipment was verified.  Attending: Lynwood Dawley  Resident : Posey Pronto  Side: R  CSF amount: 35cc  CSF color: blood tinged    Procedure Details: The patient was prepped in the usual fashion after the reservoir was palpated.  A butterfly needle was passed into the reservoir with CSF return.  CSF was gently aspirated and flowed easily. The amount of CSF listed above was withdrawn and sent for the studies listed below.  The needle was withdrawn and gentle pressure applied.  There was no residual bleeding or CSF leak noted.    @10cc  - 20% less pressure, no change in HA type/quality  @20cc  - 30% less pressure, no change in HA type/quality  @30cc  - 50% less pressure, more noticeable HA   @35cc  - 50% less pressure, HA w more stabbing pain, procedure was stopped at this point    The attending neurosurgeon listed above authorized the procedure and was available.    Complications: No complications were evident at the conclusion of the procedure.      Specimens: None

## 2020-09-21 NOTE — Unmapped (Signed)
NEUROSURGERY CONSULT PROGRESS NOTE    Requesting Service Attending Physician:  Rosita Kea  Neurosurgery Attending of Record: Dorthea Cove, MD     Brief History of Present Illness  Darrell [name] is a 34 y.o. male PMH significant for migraine, anxiety, GBM s/p multiple resections (03/21/20 at [facility] and 06/23/20) who presented for recurrence. Neurologically he has a reassuring exam. MRI brain shows interval enlargement of his right temporal GBM with increased size of the cystic component. This has caused associated mass effect and edema.    Subjective/Interval History  Treatment plan following Ommaya tap and re-examination    Interval imaging reviewed  HCT w slight interval decrease of cystic cavity, will serve as a baseline following 35cc removal    Assessment and Recommendations  **Headaches s/p Ommaya reservoir placement for interval drainage of cyst  - Please obtain a stat HCT in the ED to evaluate change in cyst volume  - I documented change in HA following volume reduction of cyst in procedure note, again copied here  ??  @10cc  - 20% less pressure, no change in HA type/quality  @20cc  - 30% less pressure, no change in HA type/quality  @30cc  - 50% less pressure, more noticeable HA   @35cc  - 50% less pressure, HA w more stabbing pain, procedure was stopped at this point    - Ok to eat  - Given no change in HA quality with low volume removal, and worsening at high volumes, recommend medical management of HAs.   - Patient's primary complaint is frontal HA described as stabbing. Pressure was not a primary complaint, but was somewhat uncomfortable for him.  - no role for neurosurgical admission  - will follow imaging  - Please have oncology page neurosurgery at [phone] to discuss longterm management of ommaya taps. We do not feel that repeat taps for symptomatic HA relief is possible or warranted, but a thoughtful plan should be put in place with both teams.  - Steroids per oncology    Please page the Neurosurgery consult pager at [phone] with questions/concerns.   ___________________________________________________________________    Neurological Exam  EOSp  PERRL  EOMI   F=  BUE 5  BLE 5  SITLT  Incision CDI    Problem List  Active Problems:    * No active hospital problems. *  Resolved Problems:    * No resolved hospital problems. *

## 2020-09-21 NOTE — Unmapped (Signed)
Pt presented to ED complaining of headache and increased pressure in head.   Hx of brain cancer has omaya, telephone encounter with Dr. Beatrix Fetters recommends head CT with neuro consult.

## 2020-09-21 NOTE — Unmapped (Signed)
Arrived from MRI/ED. Pt is alert/oriented/stable. Some pain noted.

## 2020-09-21 NOTE — Unmapped (Signed)
[facility] Emergency Department Provider Note      ED Course, Assessment and Plan     Initial Clinical Impression:    September 21, 2020 3:54 AM   Darrell [name] is a 34 y.o. male ith a history of GBM status post MRI 09/10/2020 progression who Darrell [name] with some improvement in his midline shift who is presenting with worsening headaches, tenderness at the site of his procedure on same as well as worsening memory as described below.     On exam, Vital signs stable.  Overall chronically ill-appearing and uncomfortable.  Normal cardiopulmonary exam.  Normal abdominal exam.    Exam remarkable for intermittent nystagmus pupils are equal and reactive, equal strength and sensation bilateral upper extremities.  No clonus.    BP 140/90  - Pulse 86  - Temp 36.6 ??C (97.8 ??F) (Oral)  - Resp 19  - SpO2 97%     Patient could have a malfunction of his recently installed device versus worsening of his metastasis versus new midline shift.  Plan is for CT head, pain control, consideration of admission versus neurosurgical evaluation.    ED Course:  CT shows no change from previous.  Pain is improved with morphine.  Plan is for neurosurgical consult and likely admission.  To oncology for symptomatic control          _____________________________________________________________________    The case was discussed with attending physician who is in agreement with the above assessment and plan    Dictation software was used while making this note. Please excuse any errors made with dictation software.    Additional Medical Decision Making     I have reviewed the vital signs and the nursing notes. Labs and radiology results that were available during my care of the patient were independently reviewed by me and considered in my medical decision making.     I independently visualized the EKG tracing if performed  I independently visualized the radiology images if performed  I reviewed the patient's prior medical records if available.  Additional history obtained from family if available    History     CHIEF COMPLAINT:   Chief Complaint   Patient presents with    Headache Re-evaluation       HPI: Darrell [name] is a 34 y.o. male with a history of GBM status post MRI 09/10/2020 progression who Darrell [name] with some improvement in his midline shift who is presenting with worsening headaches, tenderness at the site of his procedure on same as well as worsening memory.  Patient was sent in by his oncologist for emergency department evaluation with stat CT head and potential neurosurgery consult.  At this time, the patient is endorsing symptoms as described above.  He does state that he had a worsening headache, dizziness, weakness, nausea over the last week.  He states that nothing has been out of control his pain whereas normally his home Percocet does so.  He has had no fevers, chills.    PAST MEDICAL HISTORY/PAST SURGICAL HISTORY:   Past Medical History:   Diagnosis Date    Cancer (CMS-HCC)     Migraine        Past Surgical History:   Procedure Laterality Date    PR BURR HOLE IMPLANT CATH/DEVICE N/A 09/13/2020    Procedure: BURR HOLE(S); FOR IMPLANT VENTRICULAR CATH, RESERV, EEG ELECT, PRESSR RECORD DEVICE, OTHER CEREBRAL MONITOR;  Surgeon: Odie Sera, MD;  Location: MAIN OR [facility];  Service: Neurosurgery    PR EXCIS  SUPRATENT BRAIN TUMOR Right 06/22/2020    Procedure: CRANIECTOMY; EXC BRAIN TUMOR-SUPRATENTORIAL;  Surgeon: Corlis Leak, MD;  Location: MAIN OR [facility];  Service: Neurosurgery    PR INSERT SUBCUT RESERV/PUMP/DEV N/A 09/13/2020    Procedure: INSERTION OF SUBCUTANEOUS RESERVOIR, PUMP OR CONTINOUS INFUSION SYSTEM FOR CONNECTION TO VENTRICULAR CATH;  Surgeon: Odie Sera, MD;  Location: MAIN OR [facility];  Service: Neurosurgery    PR MICROSURG TECHNIQUES,REQ OPER MICROSCOPE Right 06/22/2020    Procedure: MICROSURGICAL TECHNIQUES, REQUIRING USE OF OPERATING MICROSCOPE (LIST SEPARATELY IN ADDITION TO CODE FOR PRIMARY PROCEDURE); Surgeon: Corlis Leak, MD;  Location: MAIN OR [facility];  Service: Neurosurgery    PR STEREOTACTIC COMP ASSIST PROC,CRANIAL,INTRADURAL N/A 06/22/2020    Procedure: STEREOTACTIC COMPUTER-ASSISTED (NAVIGATIONAL) PROCEDURE; CRANIAL, INTRADURAL;  Surgeon: Corlis Leak, MD;  Location: MAIN OR [facility];  Service: Neurosurgery    PR STEREOTACTIC COMP ASSIST PROC,CRANIAL,INTRADURAL N/A 09/13/2020    Procedure: STEREOTACTIC COMPUTER-ASSISTED (NAVIGATIONAL) PROCEDURE; CRANIAL, INTRADURAL;  Surgeon: Odie Sera, MD;  Location: MAIN OR [facility];  Service: Neurosurgery       MEDICATIONS:   No current facility-administered medications for this encounter.    Current Outpatient Medications:     acetaminophen (TYLENOL) 325 MG tablet, Take 650 mg by mouth daily as needed for pain., Disp: , Rfl:     ALPRAZolam (XANAX) 1 MG tablet, Take 1 mg by mouth two (2) times a day as needed. , Disp: , Rfl:     baclofen (LIORESAL) 20 MG tablet, Take 20 mg by mouth Three (3) times a day as needed. , Disp: , Rfl:     butalbital-acetaminophen-caffeine (ESGIC) 50-325-40 mg per tablet, Take 1 tablet by mouth every four (4) hours as needed for pain., Disp: 120 tablet, Rfl: 3    cholecalciferol, vitamin D3-50 mcg, 2,000 unit,, (VITAMIN D3) 50 mcg (2,000 unit) cap, Take 50 mcg by mouth daily., Disp: , Rfl:     dexAMETHasone (DECADRON) 4 MG tablet, Take 2 tablets (8 mg total) by mouth Two (2) times a day., Disp: 120 tablet, Rfl: 0    ibuprofen (ADVIL,MOTRIN) 200 MG tablet, Take 600 mg by mouth daily as needed for pain., Disp: , Rfl:     levETIRAcetam (KEPPRA) 1000 MG tablet, Take 1 tablet (1,000 mg total) by mouth Two (2) times a day., Disp: 180 tablet, Rfl: 3    magnesium oxide (MAG-OX) 400 mg (241.3 mg elemental magnesium) tablet, Take 1 tablet (400 mg total) by mouth daily., Disp: 200 tablet, Rfl: 1    nortriptyline (PAMELOR) 50 MG capsule, Take 2 capsules (100 mg total) by mouth every morning before breakfast., Disp: 60 capsule, Rfl: 0 ondansetron (ZOFRAN) 8 MG tablet, One tab PO 30 minutes prior chemo and q8h PRN nausea, Disp: 60 tablet, Rfl: 2    ondansetron (ZOFRAN-ODT) 8 MG disintegrating tablet, Dissolve 1 tablet on the tongue 30 minutes before chemo and every 8 hours as needed for nausea., Disp: 60 tablet, Rfl: 10    ostomy supplies (SKIN-PREP SPRAY-ON DRESSING) SprA, Apply to clean dry skin before wearing Optune device., Disp: 118 mL, Rfl: 0    prochlorperazine (COMPAZINE) 10 MG tablet, Take 1 tablet (10 mg total) by mouth Three (3) times a day before meals., Disp: 90 tablet, Rfl: 3    riboflavin, vitamin B2, 100 mg Tab, Take 1 tablet (100 mg total) by mouth daily., Disp: 90 tablet, Rfl: 3    SUMAtriptan (IMITREX) 50 MG tablet, Take 1 tablet (50 mg total) by mouth once as needed for  migraine. Use only once daily as needed., Disp: 30 tablet, Rfl: 2    temozolomide (TEMODAR) 180 mg capsule, Take 1 (180mg ) capsule with 1 (250mg ) capsule by mouth for 5 consecutive days every 28 days. Take 30 minutes after Zofran at bedtime. Total dose = 430mg ., Disp: 5 capsule, Rfl: 1    temozolomide (TEMODAR) 250 mg capsule, Take 1 (250mg ) capsule with 1 (180mg ) capsule by mouth for 5 consecutive days every 28 days. Take 30 minutes after Zofran at bedtime. Total dose = 430mg ., Disp: 5 capsule, Rfl: 1    traZODone (DESYREL) 50 MG tablet, TAKE 1/2 TO 1 TABLET(25 TO 50 MG) BY MOUTH EVERY NIGHT AS NEEDED FOR SLEEP, Disp: 30 tablet, Rfl: 1    ALLERGIES:   Patient has no known allergies.    SOCIAL HISTORY:   Social History     Tobacco Use    Smoking status: Current Every Day Smoker     Packs/day: 0.40     Years: 10.00     Pack years: 4.00     Types: Cigarettes    Smokeless tobacco: Never Used   Substance Use Topics    Alcohol use: Yes       FAMILY HISTORY:  Family History   Problem Relation Age of Onset    No Known Problems Mother     No Known Problems Father     No Known Problems Sister     No Known Problems Brother     No Known Problems Maternal Aunt     No Known Problems Maternal Uncle     No Known Problems Paternal Aunt     No Known Problems Paternal Uncle     No Known Problems Maternal Grandmother     No Known Problems Maternal Grandfather     No Known Problems Paternal Grandmother     No Known Problems Paternal Grandfather     No Known Problems Other     Amblyopia Neg Hx     Blindness Neg Hx     Cancer Neg Hx     Cataracts Neg Hx     Diabetes Neg Hx     Glaucoma Neg Hx     Hypertension Neg Hx     Macular degeneration Neg Hx     Retinal detachment Neg Hx     Strabismus Neg Hx     Stroke Neg Hx     Thyroid disease Neg Hx     Anesthesia problems Neg Hx           Review of Systems    A 10 point review of systems was performed and is negative other than positive elements noted in HPI   Constitutional: Negative for fever.  Eyes: Negative for visual changes.  ENT: Negative for sore throat.  Cardiovascular: No chest pain.  Respiratory: Negative for shortness of breath.  Gastrointestinal: Negative for abdominal pain, vomiting or diarrhea.  Genitourinary: Negative for dysuria.  Musculoskeletal: Negative for back pain.  Skin: Negative for rash.  Neurological: Negative for focal weakness or numbness.    Physical Exam     VITAL SIGNS:    BP 140/90  - Pulse 86  - Temp 36.6 ??C (97.8 ??F) (Oral)  - Resp 19  - SpO2 97%     Constitutional: Alert and oriented.  Chronically ill-appearing and in no distress.  Eyes: Conjunctivae are normal.  ENT       Head: Normocephalic and atraumatic.       Nose: No congestion.  Mouth/Throat: Mucous membranes are moist.       Neck: No stridor.  Cardiovascular: Normal rate, regular rhythm. 2+ radial pulses equal bilaterally. <2 second cap refill.  Respiratory: Normal respiratory effort. Breath sounds are normal.  Gastrointestinal: Soft and nontender.   Genitourinary: No suprapubic tenderness  Musculoskeletal: Normal range of motion in all extremities.   Neurologic: Normal speech and language.  Moves all extremities.  He does have some intermittent nystagmus to all gaze.  His pupils are equal and reactive.  No facial droop.  Normal strength and sensation in all extremities, no clonus.  Skin: Skin is warm, dry. No rash noted.  Psychiatric: Mood and affect are normal. Speech and behavior are normal.         Radiology     CT head WO contrast    (Results Pending)     No results found.        Pertinent labs & imaging results that were available during my care of the patient were reviewed by me and considered in my medical decision making (see chart for details).    Please note- This chart has been created using [employer]. Chart creation errors have been sought, but may not always be located and such creation errors, especially pronoun confusion, do NOT reflect on the standard of medical care.     Marveen Reeks, MD  Resident  09/21/20 [phone]

## 2020-09-21 NOTE — Unmapped (Signed)
10:43 AM -  I received sign out from my colleague Dr. Jimmey Ralph.    10:43 AM  Neurosurgery tapped the patient which relieved his pressure, but patient still complains of severe pain.  Neurosurgery recommends admission to heme-onc.  I have discussed this with the medicine team who will admit the patient.  They have also requested repeat CT scan which I have ordered.  Additionally, patient still complains of headache, so I am giving more medication at this time.    Action List:    - f/u NSG recs       Illness Severity: stable    Patient Summary: Darrell [name] is a 34 y.o. male male with a history of GBM status post MRI 09/10/2020 showing progression presenting with worsening headaches, tenderness at the site of his procedure on same as well as worsening memory.  Neurosurgery has evaluated, pending recommendations.    Vitals:    09/20/20 2232 09/21/20 0244 09/21/20 0635 09/21/20 1038   BP: 140/98 140/90 142/95 141/98   Pulse: 92 86 76 103   Resp: 20 19 20 20    Temp: 36.8 ??C (98.2 ??F) 36.6 ??C (97.8 ??F) 36.6 ??C (97.8 ??F) 36.8 ??C (98.2 ??F)   TempSrc: Oral Oral Oral    SpO2: 100% 97% 95% 99%       CT Head Wo Contrast   Final Result   Unchanged to minimally decreased size of the right temporal lobe cystic lesion.      CT head WO contrast   Final Result   Sequela of prior right parietal craniotomy redemonstrated cystic right temporal lobe glioblastoma with unchanged mass effect. Unchanged Ommaya reservoir with tip in the mass. No new acute intracranial abnormality.                Lab Results   Component Value Date    WBC 14.6 (H) 09/21/2020    HGB 16.0 09/21/2020    HCT 46.7 09/21/2020    PLT 284 09/21/2020       Lab Results   Component Value Date    NA 138 09/21/2020    K 4.4 09/21/2020    CL 106 09/21/2020    CO2 26.0 09/21/2020    BUN 10 09/21/2020    CREATININE 0.80 09/21/2020    GLU 139 09/21/2020    CALCIUM 9.0 09/21/2020    MG 2.1 09/13/2020    PHOS 3.7 06/22/2020       Lab Results   Component Value Date    BILITOT 0.5 09/20/2020    BILIDIR 0.40 03/15/2020    PROT 6.6 09/20/2020    ALBUMIN 3.3 (L) 09/20/2020    ALT 71 (H) 09/20/2020    AST 21 09/20/2020    ALKPHOS 37 (L) 09/20/2020       Lab Results   Component Value Date    INR 0.80 09/21/2020    APTT 22.7 (L) 09/21/2020

## 2020-09-21 NOTE — Unmapped (Signed)
Hem/Onc Phone Triage Note    Caller: Patient    Reason for Call:   Darrell [name] is a 34 y.o. progressive GMB with large cystic component, s/p MRI 09-10-20 with new midline shift and tumor/cyst progression. He was hospitalized and underwent omaya placement; CT head non contrast 09-14-20 prior to discharge showed slight improvement in midline shift.    I received a page to call Darrell [name] who reports he was in the ED earlier today following his clinic visit with Dr. Tye Maryland at 1pm. During her clinic visit, he reported worsening headaches, tenderness at his omaya site with lying down worsening balance, worsening memory and blurry vision. He was sent to the ED for imaging and neurosurgery consult given concern he may need Ommaya access and cyst drainage.    Unfortunately, he reports he remained in the ED for over 4 hours and was having worsening pain but did not have access to any medications.  He left to go home and get his home medications, and calls back now asking if he should return to the ED given he is having ongoing headache and continued symptoms as described earlier today above.    Assessment/Plan:   -Recommended he present to the ED once again for imaging and neurosurgery evaluation  -Given likely anticipated delay in obtaining MRI, would be reasonable to start with CT head w/o and contact neurosurgery for evaluation following imaging  -Further recommendations from neurosurgery team pending evaluation    Fellow Taking Call:  Reino Bellis  September 20, 2020 8:59 PM

## 2020-09-22 LAB — BASIC METABOLIC PANEL
ANION GAP: 3 mmol/L — ABNORMAL LOW (ref 5–14)
BLOOD UREA NITROGEN: 11 mg/dL (ref 9–23)
BUN / CREAT RATIO: 14
CALCIUM: 9 mg/dL (ref 8.7–10.4)
CHLORIDE: 105 mmol/L (ref 98–107)
CREATININE: 0.79 mg/dL
EGFR CKD-EPI AA MALE: >90 mL/min/{1.73_m2} (ref >=60)
EGFR CKD-EPI NON-AA MALE: >90 mL/min/{1.73_m2} (ref >=60)
GLUCOSE RANDOM: 111 mg/dL (ref 70–179)
POTASSIUM: 4 mmol/L (ref 3.4–4.5)
SODIUM: 138 mmol/L (ref 135–145)

## 2020-09-22 LAB — CBC
HEMATOCRIT: 48.6 % (ref 41.0–53.0)
HEMOGLOBIN: 15.7 g/dL (ref 13.5–17.5)
MEAN CORPUSCULAR HEMOGLOBIN CONC: 32.4 g/dL (ref 31.0–37.0)
MEAN CORPUSCULAR HEMOGLOBIN: 32.3 pg (ref 26.0–34.0)
MEAN PLATELET VOLUME: 8.4 fL (ref 7.0–10.0)
PLATELET COUNT: 306 10*9/L (ref 150–440)
RED BLOOD CELL COUNT: 4.87 10*12/L (ref 4.50–5.90)
WBC ADJUSTED: 17.8 10*9/L — ABNORMAL HIGH (ref 4.5–11.0)

## 2020-09-22 LAB — MAGNESIUM: Magnesium:MCnc:Pt:Ser/Plas:Qn:: 2.2

## 2020-09-22 LAB — BUN / CREAT RATIO: Urea nitrogen/Creatinine:MRto:Pt:Ser/Plas:Qn:: 14

## 2020-09-22 LAB — MEAN CORPUSCULAR HEMOGLOBIN CONC: Erythrocyte mean corpuscular hemoglobin concentration:MCnc:Pt:RBC:Qn:Automated count: 32.4

## 2020-09-22 MED ORDER — OXYCODONE 15 MG TABLET
ORAL_TABLET | ORAL | 0 refills | 5 days | Status: CP | PRN
Start: 2020-09-22 — End: 2020-09-27

## 2020-09-22 MED ORDER — DRONABINOL 10 MG CAPSULE
Freq: Three times a day (TID) | ORAL | 0 refills | 0 days
Start: 2020-09-22 — End: ?

## 2020-09-22 MED ORDER — LIDOCAINE 4 % TOPICAL CREAM
Freq: Three times a day (TID) | TOPICAL | 2 refills | 0 days | Status: CP | PRN
Start: 2020-09-22 — End: 2021-09-22

## 2020-09-22 MED ADMIN — dexAMETHasone (DECADRON) tablet 8 mg: 8 mg | ORAL | @ 13:00:00 | Stop: 2020-09-22

## 2020-09-22 MED ADMIN — levETIRAcetam (KEPPRA) tablet 1,000 mg: 1000 mg | ORAL | @ 01:00:00

## 2020-09-22 MED ADMIN — acetaminophen (TYLENOL) tablet 650 mg: 650 mg | ORAL | @ 17:00:00 | Stop: 2020-09-22

## 2020-09-22 MED ADMIN — oxyCODONE (ROXICODONE) immediate release tablet 5 mg: 5 mg | ORAL | @ 05:00:00 | Stop: 2020-09-22

## 2020-09-22 MED ADMIN — MORPhine 4 mg/mL injection: @ 13:00:00 | Stop: 2020-09-22

## 2020-09-22 MED ADMIN — acetaminophen (TYLENOL) tablet 650 mg: 650 mg | ORAL | @ 08:00:00 | Stop: 2020-09-22

## 2020-09-22 MED ADMIN — influenza vaccine quad (FLUARIX, FLULAVAL, FLUZONE) (6 MOS & UP) 2021-22: .5 mL | INTRAMUSCULAR | @ 18:00:00 | Stop: 2020-09-22

## 2020-09-22 MED ADMIN — oxyCODONE (ROXICODONE) immediate release tablet 10 mg: 10 mg | ORAL | @ 17:00:00 | Stop: 2020-09-22

## 2020-09-22 MED ADMIN — riboflavin (vitamin B2) tablet 100 mg: 100 mg | ORAL | @ 13:00:00 | Stop: 2020-09-22

## 2020-09-22 NOTE — Unmapped (Signed)
=  Department of Anesthesiology  Pain Medicine Division    Chronic Pain Consult Note      Requesting Attending Physician:  Merrilyn Puma*  Service Requesting Consult:  Oncology/Hematology (MDE)    Assessment/Recommendations:  The patient was seen in consultation on request of Merrilyn Puma* regarding assistance with pain management. The patient is not obtaining adequate pain relief on current medication regimen.     The patient is a 34 y.o. male with PMH GBM s/p gross total resection on 03/21/20 ([facility]), s/p resection and cyst drainage on 06/22/20 (Dr. Laqueta Jean, [facility]), s/p 6000 cGy with Dr. 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 The patient has had intermittent headaches since diagnosis that have become constant over the past week. It was thought that this could be related cystic fluid build up. Patient had Ommaya interrogation with neurosurgery and headaches became worse with drainage (though he does report that his pressure headache resolved). He reports that he continues to have a frontal headache that starts on the right and radiates across his forehead. This headache has improved with IV morphine during past hospitalizations as well as the current one. He admits that he may not be a great historian due to memory difficulties from the cancer. He is unsure how much oxycodone has helped for headaches. He has only taken x1 since arrival on BMTU. We discussed trying to take that more regularly when he has headaches and make sure to note if it helps. We recommend increasing to 5 or 10mg  PRN as he believes he did not get much relief with 5mg  in the past. We would recommend continuing IV morphine for breakthrough pain if this does not work. We also recommend utilizing lidocaine ointment over the supraorbital nerve on the right as this may help with the frontal headaches as well.     Recommendations:  - continue scheduled Tylenol  - continue nortriptyline 100mg  daily  - increase Oxycodone to 5 or 10mg  q4h PRN. Encouraged patient to take this first for pain  - continue morphine IV 2mg  q4h PRN for breakthrough pain  - Start Lidocaine gel over right supraorbital nerve    -The chronic pain service is a consult service and does not place orders, just makes recommendations (except ketamine and lidocaine infusions)  -Please evaluate all patients on opioids for appropriateness of prescribing narcan at discharge.  The chronic pain service can assist with this.  Nasal narcan covered by most insurance.  -Recommendations given apply to the current hospitalization and do not reflect long term recommendations.    We will continue to follow.    Medications trialed during this admission (or pertinent in past): tylenol, IV morphine, nortriptyline, oxycodone, IV hydromorphone, p.o. hydromorphone.    History of Present Illness:    Reason for Consult: Headaches related to GBM    Italy Coover is seen in consultation at the request of Dr. Loree Fee for evaluation of headaches related to GBM.  The patient reports intermittent headaches since his diagnosis of GBM in early 2020.  Over the past week his headaches have become more constant.  During hospitalizations in the past he had noted relief with IV morphine.  It was thought that his headaches were related to elevated intracranial pressure due to cyst.  Neurosurgery interrogated the patient's Ommaya reservoir.  There was some cyst drainage, and they noted the patient did not have improvement with drainage.  Today, the patient notes that he had  a pressure headache that did resolve with drainage but he continues to have a right frontal headache that will radiate to the left side of the head.  He describes it as a stabbing pain and a quarter sized area on the right forehead that will radiate to the left side.  He reports that it is currently 6/10 intensity.  He reports that he had a deep aching headache that felt like a pressure that had resolved with procedure by neurosurgery in the emergency room.  He reports that during previous hospitalizations his headache (the stabbing one) improved with IV morphine.  He reports that he received IV morphine in the emergency room which took his headache from 6/10 to a 2/10.  He reports he has never tried p.o. morphine.  He has received oxycodone scripts (5 mg) at home which she is unsure how much relief he has gotten from.  He reports memory difficulties since GBM diagnosis.  The patient received 5 mg of oxycodone and 2 mg of IV morphine since being in the bone marrow transplant unit but does not remember receiving either of these or if they helped.  The patient is currently on nortriptyline 100 mg daily which she reports helped with the pressure headache but does not do anything for his for his frontal headache that he has been having.    Of note, the patient reports that his headaches are significantly better this morning than they were yesterday.  We discussed that it may be possible that the procedure that neurosurgery did yesterday just took a little longer than expected to help improve his pain.  He is hoping to go home today.    Of note, the patient's brother is with him in his room but did not provide much history.    For the pain, the patient's current medication regimen includes:  Tylenol 650mg  q6h  Dexamethasone 8mg  q12 hr  Nortriptyline 100mg  PO daily  Baclofen 20mg  TID PRN - x0  Morphine IV 2mg  q4h PRN- x1 since arrival in BMTU   Oxycodone 5mg  q4h PRN - x1 since arrival in BMTU at 15:50 on 09/22/20       Prior to admission, the patient was not on home opiate medications.  Hehas not been followed by a pain clinic.  The  CSRS was reviewed.    Allergies    No Known Allergies    Home Medications    Medications Prior to Admission   Medication Sig Dispense Refill Last Dose    acetaminophen (TYLENOL) 325 MG tablet Take 650 mg by mouth daily as needed for pain. ALPRAZolam (XANAX) 1 MG tablet Take 1 mg by mouth two (2) times a day as needed.        baclofen (LIORESAL) 20 MG tablet Take 20 mg by mouth Three (3) times a day as needed.        butalbital-acetaminophen-caffeine (ESGIC) 50-325-40 mg per tablet Take 1 tablet by mouth every four (4) hours as needed for pain. 120 tablet 3     cholecalciferol, vitamin D3-50 mcg, 2,000 unit,, (VITAMIN D3) 50 mcg (2,000 unit) cap Take 50 mcg by mouth daily.       dexAMETHasone (DECADRON) 4 MG tablet Take 2 tablets (8 mg total) by mouth Two (2) times a day. 120 tablet 0     [EXPIRED] dronabinoL (MARINOL) 10 MG capsule Take 1 capsule (10 mg total) by mouth Three (3) times a day before meals for 5 days. 15 capsule 0     ibuprofen (  ADVIL,MOTRIN) 200 MG tablet Take 600 mg by mouth daily as needed for pain.       levETIRAcetam (KEPPRA) 1000 MG tablet Take 1 tablet (1,000 mg total) by mouth Two (2) times a day. 180 tablet 3     magnesium oxide (MAG-OX) 400 mg (241.3 mg elemental magnesium) tablet Take 1 tablet (400 mg total) by mouth daily. 200 tablet 1     nortriptyline (PAMELOR) 50 MG capsule Take 2 capsules (100 mg total) by mouth every morning before breakfast. 60 capsule 0     ondansetron (ZOFRAN) 8 MG tablet One tab PO 30 minutes prior chemo and q8h PRN nausea 60 tablet 2     ondansetron (ZOFRAN-ODT) 8 MG disintegrating tablet Dissolve 1 tablet on the tongue 30 minutes before chemo and every 8 hours as needed for nausea. 60 tablet 10     ostomy supplies (SKIN-PREP SPRAY-ON DRESSING) SprA Apply to clean dry skin before wearing Optune device. 118 mL 0     [EXPIRED] oxyCODONE (ROXICODONE) 5 MG immediate release tablet Take 1 tablet (5 mg total) by mouth every four (4) hours as needed for up to 5 days. 30 tablet 0     prochlorperazine (COMPAZINE) 10 MG tablet Take 1 tablet (10 mg total) by mouth Three (3) times a day before meals. 90 tablet 3     riboflavin, vitamin B2, 100 mg Tab Take 1 tablet (100 mg total) by mouth daily. 90 tablet 3 SUMAtriptan (IMITREX) 50 MG tablet Take 1 tablet (50 mg total) by mouth once as needed for migraine. Use only once daily as needed. 30 tablet 2     temozolomide (TEMODAR) 180 mg capsule Take 1 (180mg ) capsule with 1 (250mg ) capsule by mouth for 5 consecutive days every 28 days. Take 30 minutes after Zofran at bedtime. Total dose = 430mg . 5 capsule 1     temozolomide (TEMODAR) 250 mg capsule Take 1 (250mg ) capsule with 1 (180mg ) capsule by mouth for 5 consecutive days every 28 days. Take 30 minutes after Zofran at bedtime. Total dose = 430mg . 5 capsule 1     traZODone (DESYREL) 50 MG tablet TAKE 1/2 TO 1 TABLET(25 TO 50 MG) BY MOUTH EVERY NIGHT AS NEEDED FOR SLEEP 30 tablet 1        Inpatient Medications  Current Facility-Administered Medications   Medication Dose Route Frequency Provider Last Rate Last Admin    acetaminophen (TYLENOL) tablet 650 mg  650 mg Oral Q6H Jacklynn Barnacle, MD   650 mg at 09/22/20 0429    ALPRAZolam (XANAX) tablet 1 mg  1 mg Oral BID PRN Jacklynn Barnacle, MD        baclofen (LIORESAL) tablet 20 mg  20 mg Oral TID PRN Jacklynn Barnacle, MD        cholecalciferol (vitamin D3 25 mcg (1,000 units)) tablet 50 mcg  50 mcg Oral Daily Jacklynn Barnacle, MD   50 mcg at 09/21/20 1122    dexAMETHasone (DECADRON) tablet 8 mg  8 mg Oral Q12H [facility] Jacklynn Barnacle, MD   8 mg at 09/21/20 2034    dronabinoL (MARINOL) capsule 10 mg  10 mg Oral TID AC Jacklynn Barnacle, MD   10 mg at 09/22/20 0646    influenza vaccine quad (FLUARIX, FLULAVAL, FLUZONE) (6 MOS & UP) 2021-22  0.5 mL Intramuscular During hospitalization Marco Collie, MD        levETIRAcetam (KEPPRA) tablet 1,000 mg  1,000 mg Oral BID Jacklynn Barnacle,  MD   1,000 mg at 09/21/20 2034    MORPhine injection 2 mg  2 mg Intravenous Q4H PRN Jacklynn Barnacle, MD   2 mg at 09/21/20 1550    nicotine polacrilex (NICORETTE) gum 4 mg  4 mg Buccal Q1H PRN Jacklynn Barnacle, MD        nortriptyline (PAMELOR) capsule 100 mg  100 mg Oral QAM AC Jacklynn Barnacle, MD   100 mg at 09/22/20 1610    oxyCODONE (ROXICODONE) immediate release tablet 5 mg  5 mg Oral Q4H PRN Jacklynn Barnacle, MD   5 mg at 09/22/20 0043    polyethylene glycol (MIRALAX) packet 17 g  17 g Oral Daily PRN Jacklynn Barnacle, MD        prochlorperazine (COMPAZINE) tablet 5 mg  5 mg Oral Q6H PRN Jacklynn Barnacle, MD        riboflavin (vitamin B2) tablet 100 mg  100 mg Oral Daily Jacklynn Barnacle, MD   100 mg at 09/21/20 1514    traZODone (DESYREL) tablet 50 mg  50 mg Oral Nightly PRN Jacklynn Barnacle, MD             Past Medical History    Past Medical History:   Diagnosis Date    Cancer (CMS-HCC)     Migraine            Past Surgical History    Past Surgical History:   Procedure Laterality Date    PR BURR HOLE IMPLANT CATH/DEVICE N/A 09/13/2020    Procedure: BURR HOLE(S); FOR IMPLANT VENTRICULAR CATH, RESERV, EEG ELECT, PRESSR RECORD DEVICE, OTHER CEREBRAL MONITOR;  Surgeon: Odie Sera, MD;  Location: MAIN OR [facility];  Service: Neurosurgery    PR EXCIS SUPRATENT BRAIN TUMOR Right 06/22/2020    Procedure: CRANIECTOMY; EXC BRAIN TUMOR-SUPRATENTORIAL;  Surgeon: Corlis Leak, MD;  Location: MAIN OR [facility];  Service: Neurosurgery    PR INSERT SUBCUT RESERV/PUMP/DEV N/A 09/13/2020    Procedure: INSERTION OF SUBCUTANEOUS RESERVOIR, PUMP OR CONTINOUS INFUSION SYSTEM FOR CONNECTION TO VENTRICULAR CATH;  Surgeon: Odie Sera, MD;  Location: MAIN OR [place];  Service: Neurosurgery    PR MICROSURG TECHNIQUES,REQ OPER MICROSCOPE Right 06/22/2020    Procedure: MICROSURGICAL TECHNIQUES, REQUIRING USE OF OPERATING MICROSCOPE (LIST SEPARATELY IN ADDITION TO CODE FOR PRIMARY PROCEDURE);  Surgeon: Corlis Leak, MD;  Location: MAIN OR [facility];  Service: Neurosurgery    PR STEREOTACTIC COMP ASSIST PROC,CRANIAL,INTRADURAL N/A 06/22/2020    Procedure: STEREOTACTIC COMPUTER-ASSISTED (NAVIGATIONAL) PROCEDURE; CRANIAL, INTRADURAL;  Surgeon: Corlis Leak, MD;  Location: MAIN OR [facility];  Service: Neurosurgery    PR STEREOTACTIC COMP ASSIST PROC,CRANIAL,INTRADURAL N/A 09/13/2020    Procedure: STEREOTACTIC COMPUTER-ASSISTED (NAVIGATIONAL) PROCEDURE; CRANIAL, INTRADURAL;  Surgeon: Odie Sera, MD;  Location: MAIN OR [facility];  Service: Neurosurgery           Family History    Family History   Problem Relation Age of Onset    No Known Problems Mother     No Known Problems Father     No Known Problems Sister     No Known Problems Brother     No Known Problems Maternal Aunt     No Known Problems Maternal Uncle     No Known Problems Paternal Aunt     No Known Problems Paternal Uncle     No Known Problems Maternal Grandmother     No Known Problems Maternal Grandfather     No Known Problems Paternal  Grandmother     No Known Problems Paternal Grandfather     No Known Problems Other     Amblyopia Neg Hx     Blindness Neg Hx     Cancer Neg Hx     Cataracts Neg Hx     Diabetes Neg Hx     Glaucoma Neg Hx     Hypertension Neg Hx     Macular degeneration Neg Hx     Retinal detachment Neg Hx     Strabismus Neg Hx     Stroke Neg Hx     Thyroid disease Neg Hx     Anesthesia problems Neg Hx          Social History:  Social History     Tobacco Use   Smoking Status Current Every Day Smoker    Packs/day: 0.40    Years: 10.00    Pack years: 4.00    Types: Cigarettes   Smokeless Tobacco Never Used     Social History     Substance and Sexual Activity   Alcohol Use Yes     Social History     Substance and Sexual Activity   Drug Use Never         Review of Systems    A 12 system review of systems was negative except as noted in HPI    Pertinent Imaging/Labs:  Lab Results   Component Value Date    WBC 17.8 (H) 09/22/2020    HGB 15.7 09/22/2020    HCT 48.6 09/22/2020    PLT 306 09/22/2020       Lab Results   Component Value Date    NA 136 09/21/2020    K 3.8 09/21/2020    CL 103 09/21/2020    CO2 25.0 09/21/2020    BUN 8 (L) 09/21/2020    CREATININE 0.67 (L) 09/21/2020    GLU 106 09/21/2020    CALCIUM 8.6 (L) 09/21/2020    MG 2.1 09/21/2020    PHOS 3.7 06/22/2020       Lab Results   Component Value Date    BILITOT 0.5 09/20/2020    BILIDIR 0.40 03/15/2020    PROT 6.6 09/20/2020    ALBUMIN 3.3 (L) 09/20/2020    ALT 71 (H) 09/20/2020    AST 21 09/20/2020    ALKPHOS 37 (L) 09/20/2020       Lab Results   Component Value Date    PT 9.7 (L) 09/21/2020    INR 0.83 09/21/2020    APTT 22.7 (L) 09/21/2020       Objective:     Vital Signs  Temp:  [36.7 ??C-36.8 ??C] 36.7 ??C  Heart Rate:  [76-103] 79  Resp:  [17-20] 17  BP: (126-141)/(71-98) 126/78  MAP (mmHg):  [86-109] 90  SpO2:  [99 %-100 %] 100 %  BMI (Calculated):  [26.05-26.85] 26.85    Physical Exam    GENERAL:  Well developed, well-nourished male and is in no apparent distress. The patient is pleasant and interactive.No evidence of sedation or intoxication.  No overt pain behaviors.  The patient's brother is in his room with him today.  HEAD/NECK:    Reveals normocephalic/atraumatic. Mucous membranes are moist.   CARDIOVASCULAR:   Warm, well perfused  LUNGS:   Normal work of breathing, no supplemental 02  EXTREMITIES:  Warm, no clubbing, cyanosis, or edema was noted.  NEUROLOGIC:    The patient was alert with normal language, attention, cognition and memory.  Extraocular  movements were intact, no facial droop, no slurred speech.  SKIN:  Well-healed scar over right forehead and side of head from prior surgery  PSY:  Appropriate affect and mood.                Test Results    All lab results last 24 hours:    Recent Results (from the past 24 hour(s))   Basic metabolic panel    Collection Time: 09/21/20 11:37 AM   Result Value Ref Range    Sodium 136 135 - 145 mmol/L    Potassium 3.8 3.4 - 4.5 mmol/L    Chloride 103 98 - 107 mmol/L    CO2 25.0 20.0 - 31.0 mmol/L    Anion Gap 8 5 - 14 mmol/L    BUN 8 (L) 9 - 23 mg/dL    Creatinine 1.61 (L) 0.70 - 1.30 mg/dL    BUN/Creatinine Ratio 12     EGFR CKD-EPI Non-African American, Male >90 >=60 mL/min/1.83m2    EGFR CKD-EPI African American, Male >90 >=60 mL/min/1.62m2    Glucose 106 70 - 179 mg/dL    Calcium 8.6 (L) 8.7 - 10.4 mg/dL   Magnesium Level    Collection Time: 09/21/20 11:37 AM   Result Value Ref Range    Magnesium 2.1 1.6 - 2.6 mg/dL   CBC    Collection Time: 09/21/20 11:37 AM   Result Value Ref Range    WBC 17.0 (H) 4.5 - 11.0 10*9/L    RBC 4.53 4.50 - 5.90 10*12/L    HGB 15.1 13.5 - 17.5 g/dL    HCT 09.6 04.5 - 40.9 %    MCV 99.2 80.0 - 100.0 fL    MCH 33.2 26.0 - 34.0 pg    MCHC 33.5 31.0 - 37.0 g/dL    RDW 81.1 91.4 - 78.2 %    MPV 8.7 7.0 - 10.0 fL    Platelet 318 150 - 440 10*9/L   COVID-19 PCR    Collection Time: 09/21/20 11:37 AM    Specimen: Nasopharyngeal Swab   Result Value Ref Range    SARS-CoV-2 PCR Negative Negative   PT-INR    Collection Time: 09/21/20 11:38 AM   Result Value Ref Range    PT 9.7 (L) 10.3 - 13.4 sec    INR 0.83    CBC    Collection Time: 09/22/20  9:03 AM   Result Value Ref Range    WBC 17.8 (H) 4.5 - 11.0 10*9/L    RBC 4.87 4.50 - 5.90 10*12/L    HGB 15.7 13.5 - 17.5 g/dL    HCT 95.6 21.3 - 08.6 %    MCV 99.8 80.0 - 100.0 fL    MCH 32.3 26.0 - 34.0 pg    MCHC 32.4 31.0 - 37.0 g/dL    RDW 57.8 46.9 - 62.9 %    MPV 8.4 7.0 - 10.0 fL    Platelet 306 150 - 440 10*9/L       Reviewed his recent CT brain as well.    Problem List    Active Problems:    Glioblastoma (CMS-HCC)      I saw and evaluated the patient, participating in the key portions of the service.  I reviewed the fellow's note and agree with the findings and plan.  Criss Rosales, MD

## 2020-09-22 NOTE — Unmapped (Signed)
Physician Discharge Summary [facility]  [address]  [place] [address]  Dept: [phone]  Loc: [phone]     Identifying Information:   Darrell [name]  1986-10-29  [id number]    Primary Care Physician: Barnett Hatter, NP     Referring Physician: Referred Self     Code Status: Full Code    Admit Date: 09/21/2020    Discharge Date: 09/22/2020     Discharge To: Home    Discharge Service: [facility] - Oncology Floor Team (MEDO)     Discharge Attending Physician: Marco Collie, MD    Discharge Diagnoses:  Active Problems:    Glioblastoma (CMS-HCC)  Resolved Problems:    * No resolved hospital problems. *      Outpatient Provider Follow Up Issues:   Follow-up Plan after discharge:  1. Issues related to hospitalization:   Pain Control/Headaches: prescribed oxycodone 15mg  q4 prn for pain/headaches. Consider if outpatient neurology vs palliative care follow up may be helpful for symptom control.     2. Follow-up appointment with [facility] Oncology: 09/26/2020 - Dr. Flonnie Hailstone (radiation oncology)  3. Oncology specific plans going forward: to be discussed with primary oncologist     Patient's primary oncologist and/or nurse navigator: Dr. Blanca Friend handoff via Epic message or direct conversation?: Yes. 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 Adriana Simas at [facility],??s/p resection and cyst drainage on 06/22/20 with Dr. Ihor Dow 6000 cGy with Dr. 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      He presented after a clinic visit with Dr. Tye [place] where he was having worsening headaches. He presented to the ED where a CT head was performed and revealed sequela of prior right parietal craniotomy with redemonstrated cystic right temporal lobe glioblastoma with unchanged mass effect and an unchanged Ommaya reservoir with tip in the mass. Neurosurgery was consulted for evaluation and performed CSF drainage via the ommaya reservoir. He experienced less pressure, but similar headache and was admitted for symptom control. Given this, neurosurgery felt that conversion of an ommaya to a shunt would not provide symptomatic benefit.     His headaches were controlled with PO oxycodone. He reported substantial improvement in his symptoms with increased oxycodone doses (15mg ). Chronic pain was consulted and recommended oxycodone + topical lidocaine for alleviation of right supraorbital nerve component pain. He felt well by 9/25 and desired to go home. He was discharged with oxycodone 15mg  q4 hours for pain and headaches and topical lidocaine. His next follow up is with radiation oncology on 09/26/2020, and his primary oncologist was messaged to ensure continuity.      Procedures:  None  No admission procedures for hospital encounter.  ______________________________________________________________________  Discharge Medications:     Your Medication List      STOP taking these medications    ibuprofen 200 MG tablet  Commonly known as: ADVIL,MOTRIN     ondansetron 8 MG tablet  Commonly known as: ZOFRAN        START taking these medications    lidocaine 4 % cream  Commonly known as: LMX  Apply topically Three (3) times a day as needed (Pain).        CHANGE how you  take these medications    oxyCODONE 15 MG immediate release tablet  Commonly known as: ROXICODONE  Take 1 tablet (15 mg total) by mouth every four (4) hours as needed for pain for up to 5 days.  What changed:   ?? medication strength  ?? how much to take  ?? reasons to take this        CONTINUE taking these medications    acetaminophen 325 MG tablet  Commonly known as: TYLENOL  Take 650 mg by mouth daily as needed for pain.     ALPRAZolam 1 MG tablet  Commonly known as: XANAX  Take 1 mg by mouth two (2) times a day as needed.     baclofen 20 MG tablet  Commonly known as: LIORESAL  Take 20 mg by mouth Three (3) times a day as needed.     butalbital-acetaminophen-caffeine 50-325-40 mg per tablet  Commonly known as: ESGIC  Take 1 tablet by mouth every four (4) hours as needed for pain.     dexAMETHasone 4 MG tablet  Commonly known as: DECADRON  Take 2 tablets (8 mg total) by mouth Two (2) times a day.     dronabinoL 10 MG capsule  Commonly known as: MARINOL  Take 1 capsule (10 mg total) by mouth Three (3) times a day before meals.     levETIRAcetam 1000 MG tablet  Commonly known as: KEPPRA  Take 1 tablet (1,000 mg total) by mouth Two (2) times a day.     magnesium oxide 400 mg (241.3 mg elemental) tablet  Commonly known as: MAGOX  Take 1 tablet (400 mg total) by mouth daily.     nortriptyline 50 MG capsule  Commonly known as: PAMELOR  Take 2 capsules (100 mg total) by mouth every morning before breakfast.     ondansetron 8 MG disintegrating tablet  Commonly known as: ZOFRAN-ODT  Dissolve 1 tablet on the tongue 30 minutes before chemo and every 8 hours as needed for nausea.     prochlorperazine 10 MG tablet  Commonly known as: COMPAZINE  Take 1 tablet (10 mg total) by mouth Three (3) times a day before meals.     riboflavin (vitamin B2) 100 mg Tab  Take 1 tablet (100 mg total) by mouth daily.     SKIN-PREP SPRAY-ON DRESSING Spra  Generic drug: ostomy supplies  Apply to clean dry skin before wearing Optune device.     SUMAtriptan 50 MG tablet  Commonly known as: IMITREX  Take 1 tablet (50 mg total) by mouth once as needed for migraine. Use only once daily as needed.     temozolomide 250 mg capsule  Commonly known as: TEMODAR  Take 1 (250mg ) capsule with 1 (180mg ) capsule by mouth for 5 consecutive days every 28 days. Take 30 minutes after Zofran at bedtime. Total dose = 430mg .     temozolomide 180 mg capsule  Commonly known as: TEMODAR  Take 1 (180mg ) capsule with 1 (250mg ) capsule by mouth for 5 consecutive days every 28 days. Take 30 minutes after Zofran at bedtime. Total dose = 430mg .     traZODone 50 MG tablet  Commonly known as: DESYREL  TAKE 1/2 TO 1 TABLET(25 TO 50 MG) BY MOUTH EVERY NIGHT AS NEEDED FOR SLEEP     VITAMIN D3 50 mcg (2,000 unit) Cap  Generic drug: cholecalciferol (vitamin D3-50 mcg (2,000 unit))  Take 50 mcg by mouth daily.            Allergies:  Patient has no known allergies.  ______________________________________________________________________  Pending Test Results (if blank, then none):      Most Recent Labs:  All lab results last 24 hours -   Recent Results (from the past 24 hour(s))   Basic metabolic panel    Collection Time: 09/22/20  9:03 AM   Result Value Ref Range    Sodium 138 135 - 145 mmol/L    Potassium 4.0 3.4 - 4.5 mmol/L    Chloride 105 98 - 107 mmol/L    CO2 30.0 20.0 - 31.0 mmol/L    Anion Gap 3 (L) 5 - 14 mmol/L    BUN 11 9 - 23 mg/dL    Creatinine 1.61 0.96 - 1.30 mg/dL    BUN/Creatinine Ratio 14     EGFR CKD-EPI Non-African American, Male >90 >=60 mL/min/1.68m2    EGFR CKD-EPI African American, Male >90 >=60 mL/min/1.26m2    Glucose 111 70 - 179 mg/dL    Calcium 9.0 8.7 - 04.5 mg/dL   CBC    Collection Time: 09/22/20  9:03 AM   Result Value Ref Range    WBC 17.8 (H) 4.5 - 11.0 10*9/L    RBC 4.87 4.50 - 5.90 10*12/L    HGB 15.7 13.5 - 17.5 g/dL    HCT 40.9 81.1 - 91.4 %    MCV 99.8 80.0 - 100.0 fL    MCH 32.3 26.0 - 34.0 pg    MCHC 32.4 31.0 - 37.0 g/dL    RDW 78.2 95.6 - 21.3 %    MPV 8.4 7.0 - 10.0 fL    Platelet 306 150 - 440 10*9/L   Magnesium Level    Collection Time: 09/22/20  9:03 AM   Result Value Ref Range    Magnesium 2.2 1.6 - 2.6 mg/dL       Relevant Studies/Radiology (if blank, then none):  CT Head Wo Contrast    Result Date: 09/21/2020  EXAM: Computed tomography, head or brain without contrast material. DATE: 09/21/2020 10:06 AM ACCESSION: [id number] UN DICTATED: 09/21/2020 10:15 AM INTERPRETATION LOCATION: 54. CLINICAL INDICATION: 34 years old Male with re - eval ventricles after tap ; Brain/CNS neoplasm, surveillance  COMPARISON: Study done at [facility] and dated 09-21-2020. TECHNIQUE: Axial CT images of the head  from skull base to vertex without contrast. FINDINGS: Cystic lesion in the right temporal region remains unchanged to slightly decreased in size. Intralesional catheter remains in unchanged position. Degree of mass effect is unchanged. This minimal dilatation of the left lateral ventricle, also unchanged.     Unchanged to minimally decreased size of the right temporal lobe cystic lesion.    CT head WO contrast    Result Date: 09/21/2020  EXAM: Computed tomography, head or brain without contrast material. DATE: 09/21/2020 3:29 AM ACCESSION: [id number] UN DICTATED: 09/21/2020 3:48 AM INTERPRETATION LOCATION: [facility] CLINICAL INDICATION: 34 years old Male with ongoing headache ; Headache, new or worsening, positional (Age 51 - 48y)  COMPARISON: 09/14/2020 CT head TECHNIQUE: Axial CT images of the head  from skull base to vertex without contrast. FINDINGS: Redemonstrated large cystic right temporal lobe glioblastoma measuring up to 8.0 x 5.0 cm transaxially (2:14), similar to prior, with similar degree of surrounding vasogenic edema. Approximately 0.7 cm leftward midline shift shift with near complete effacement of the right ventricle, similar to prior. Sequela from prior right parietal craniotomy. Unchanged Ommaya reservoir with tip terminating in the infra medial cystic mass. Unchanged effacement of the sylvian cistern. Trace right uncal herniation, unchanged from  prior. The remaining basal cisterns are patent. There is no evidence of acute infarct. No acute intracranial hemorrhage. No fractures are evident. The sinuses are pneumatized.        Sequela of prior right parietal craniotomy redemonstrated cystic right temporal lobe glioblastoma with unchanged mass effect. Unchanged Ommaya reservoir with tip in the mass. No new acute intracranial abnormality. MRI Brain W Wo Contrast    Result Date: 09/21/2020  EXAM: Magnetic resonance imaging, brain, without and with contrast material. DATE: 09/21/2020 2:39 PM ACCESSION: [id number] UN DICTATED: 09/21/2020 3:01 PM INTERPRETATION LOCATION: [facility] CLINICAL INDICATION: 34 years old Male with known GBM, worsening headaches  COMPARISON: MRI brain 09/10/2020 TECHNIQUE: Multiplanar, multisequence MR imaging of the brain was performed without and with I.V. contrast. FINDINGS: Interval appearance of right posterior parietal approach insertion of a drainage tube into the right temporal cystic lesion. A 8 cm x 5.4 cm x 4.1 cm cystic mass mass is once again noted right temporal lobe with enhancing rim and communicating with anterior right temporal horn. Part of this rim enhancement shows an area of thickening along its medial aspect once again measuring 2.6 cm x 1 cm. Size of the cystic lesion is mildly reduced as compared to prior MRI 09/10/2020 when it measured 8.7 cm x 7.3 cm x 4.9 cm. There are also additional nodular or irregular enhancement along the walls of right posterior lateral ventricle and cavum septum pellucidum. The cystic mass is indenting the adjacent right midbrain and producing a 1 cm shift of midline structures toward the left, improved since prior MRI. The amount of vasogenic edema surrounding the cystic mass also appears reduced. Cerebral ventricles demonstrate narrowing of the right lateral ventricle related to compression by the tumor.     Mild reduction in size of previously noted large cystic mass and surrounding edema in the right temporal lobe as compared to 09/10/2020. Midline shift toward the left is now 1 cm as compared to 2 cm on preceding exam. Interval appearance of a drainage catheter within the right temporal cystic mass. The nodular enhancement surrounding the cyst, posterior right lateral ventricle and cavum septum pellucidum are concerning for recurrent tumors. Similar compared to prior MRI. ______________________________________________________________________  Discharge Instructions:     Activity Instructions     Activity as tolerated            Diet Instructions     Discharge diet (specify)      Discharge Nutrition Therapy: Regular          Other Instructions     Call MD for:  difficulty breathing, headache or visual disturbances      Call MD for:  persistent dizziness or light-headedness      Call MD for:  persistent nausea or vomiting      Call MD for:  redness, tenderness, or signs of infection (pain, swelling, redness, odor or green/yellow discharge around incision site)      Call MD for:  severe uncontrolled pain      Call MD for: Temperature > 38.5 Celsius ( > 101.3 Fahrenheit)      Discharge instructions      It was a pleasure taking care of you!    Diagnosis: Headaches following surgery and ommaya placement    New/Important Medications:  -oxycodone 15mg  every 4 hours as needed for severe pain    We recommend you take a laxative while using oxycodone, as this can cause significant constipation. You can use miralax once daily and increase this to twice  daily if you are not having one good bowel movement daily.     Home Health Needs: none    Follow up Appointments:   - radiation oncology on 9/29  - we will reach out to Dr. Delight Stare team to see when they would like to see you next     --------------    When to Call Your [facility] Cancer Care Team:   Monday- Friday from 8:00 am - 5:00 pm: Call [phone] or Toll free [phone].  Ask to speak to the Triage Nurse  On Nights, Weekends and Holidays: Call [phone]. Ask the operator to page the Oncology Fellow on Call     RED ZONE:  Take action now!  You need to be seen right away. Call 911 or go to your nearest hospital for help.   - Symptoms are at a severe level of discomfort    - Bleeding that will not stop  - Chest Pain    - Hard to breathe    - Fall or passing out    - New Seizure    - Thoughts of hurting yourself or others YELLOW ZONE: Take action today  This is NOT an all-inclusive list. Pleae call with any new or worsening symptoms.   Call your doctor, nurse or other healthcare provider at [phone]  You can be seen by a provider the same day through our Same Day Acute Care for Patients with Cancer program.   - Symptoms are new or worsening; You are not within your goal range for:    - Pain          - Swelling (leg, arm, abdomen, face, neck)    - Shortness of breath        - Skin rash or skin changes    - Bleeding (nose, urine, stool, wound)    - Wound issues (redness, drainage, re-opened)    - Feeling sick to your stomach and throwing up    - Confusion    - Mouth sores/pain in your mouth or throat     - Vision changes   - Hard stool or very loose stools (increase in ostomy   - Fever >100.4 F, chills     Output)        - Worsening cough with mucus that is green, yellow or bloody   - No urine for 12 hours      - Pain or burning when going to the bathroom    - Feeding tube or other catheter/tube issue     - Home infusion pump issue - call [phone]   - Redness or pain at previous IV or port/catheter site    - Depressed or anxiety     GREEN ZONE: You are in control   Your symptoms are under control. Continue to take your medicine as ordered. Keep all visits to the doctor.   - No increase or worsening symptoms   - Able to take your medicine   - Able to drink and eat     For your safety and best care, please DO NOT use MyChart messages to report red or yellow symptoms.   MyChart messages are only checked during weekday normal business hours and you should receive a   Response within 2 business days.   Please use MyChart only for the follows:   - Non-urgent medication refills, scheduling requests or general questions.           Patient Education:     - [address]  your hands routinely with soap and water  - Take your temperature when you have chills or are not feeling well  - Use a soft toothbrush  - Avoid constipation or straining with bowel movements. This may mean you occasionally need to take over-the-counter stool softeners or laxatives.   - Avoid people who have colds or the flu, or are not feeling well.  - Wear a mask when visiting crowded places.  - Maintain a well-balanced diet and eat healthy foods  - Speak with your doctor before having any dental work done  - Do only as much activity as you can tolerate    Other instructions:  - Don't use dental floss if your platelet count is below 50,000. Your doctor or nurse should tell you if this is the case.  - Use any mouthwashes given to you as directed.  - If you can't tolerate regular brushing, use an oral swab (bristle-less) toothbrush, or use salt and baking soda to clean your mouth. Mix 1 teaspoon of salt and 1 teaspoon of baking soda into an 8-ounce glass of warm water. Swish and spit.  - Watch your mouth and tongue for white patches. This is a sign of fungal infection, a common side effect of chemotherapy. Be sure to tell your doctor about these patches. Medication/mouthwashes can be prescribed to help you fight the fungal infection.      COVID-19 is a new challenge, but  and the [facility] is dedicated to providing you and your loved ones with the best possible cancer care and support in the safest way possible during this time. We made two videos about the ways we are working to keep you safe, such as offering the option to visit your care team over the phone or through a video, as well as support services offered for our patients and their caregivers. If you have any questions about your cancer care, please call your care team.  ??  Video #1: Keeping [facility] Cancer Care patients safe during the COVID-19 crisis  http://go.eabjmlille.com  ??  Video #2: Support for cancer patients and their caregivers during the COVID-19 pandemic  http://go.SecureGap.uy  ??  Video #2: Support for cancer patients and their caregivers during the COVID-19 pandemic  [email or web address].SecureGap.uy         It was a pleasure taking care of you!    Diagnosis: Headaches following surgery and ommaya placement    New/Important Medications:  -oxycodone 15mg  every 4 hours as needed for severe pain    We recommend you take a laxative while using oxycodone, as this can cause significant constipation. You can use miralax once daily and increase this to twice daily if you are not having one good bowel movement daily.     Home Health Needs: none    Follow up Appointments:   - radiation oncology on 9/29  - we will reach out to Dr. Delight Stare team to see when they would like to see you next     --------------    When to Call Your [facility] Cancer Care Team:   Monday- Friday from 8:00 am - 5:00 pm: Call [phone] or Toll free [phone].  Ask to speak to the Triage Nurse  On Nights, Weekends and Holidays: Call [phone]. Ask the operator to page the Oncology Fellow on Call     RED ZONE:  Take action now!  You need to be seen right away. Call 911 or go to your nearest hospital for help.   -  Symptoms are at a severe level of discomfort    - Bleeding that will not stop  - Chest Pain    - Hard to breathe    - Fall or passing out    - New Seizure    - Thoughts of hurting yourself or others     YELLOW ZONE: Take action today  This is NOT an all-inclusive list. Pleae call with any new or worsening symptoms.   Call your doctor, nurse or other healthcare provider at [phone]  You can be seen by a provider the same day through our Same Day Acute Care for Patients with Cancer program.   - Symptoms are new or worsening; You are not within your goal range for:    - Pain          - Swelling (leg, arm, abdomen, face, neck)    - Shortness of breath        - Skin rash or skin changes    - Bleeding (nose, urine, stool, wound)    - Wound issues (redness, drainage, re-opened)    - Feeling sick to your stomach and throwing up    - Confusion    - Mouth sores/pain in your mouth or throat     - Vision changes   - Hard stool or very loose stools (increase in ostomy   - Fever >100.4 F, chills     Output)        - Worsening cough with mucus that is green, yellow or bloody   - No urine for 12 hours      - Pain or burning when going to the bathroom    - Feeding tube or other catheter/tube issue     - Home infusion pump issue - call [phone]   - Redness or pain at previous IV or port/catheter site    - Depressed or anxiety     GREEN ZONE: You are in control   Your symptoms are under control. Continue to take your medicine as ordered. Keep all visits to the doctor.   - No increase or worsening symptoms   - Able to take your medicine   - Able to drink and eat     For your safety and best care, please DO NOT use MyChart messages to report red or yellow symptoms.   MyChart messages are only checked during weekday normal business hours and you should receive a   Response within 2 business days.   Please use MyChart only for the follows:   - Non-urgent medication refills, scheduling requests or general questions.           Patient Education:     - Wash your hands routinely with soap and water  - Take your temperature when you have chills or are not feeling well  - Use a soft toothbrush  - Avoid constipation or straining with bowel movements. This may mean you occasionally need to take over-the-counter stool softeners or laxatives.   - Avoid people who have colds or the flu, or are not feeling well.  - Wear a mask when visiting crowded places.  - Maintain a well-balanced diet and eat healthy foods  - Speak with your doctor before having any dental work done  - Do only as much activity as you can tolerate    Other instructions:  - Don't use dental floss if your platelet count is below 50,000. Your doctor or nurse should tell you if this is the case.  -  Use any mouthwashes given to you as directed.  - If you can't tolerate regular brushing, use an oral swab (bristle-less) toothbrush, or use salt and baking soda to clean your mouth. Mix 1 teaspoon of salt and 1 teaspoon of baking soda into an 8-ounce glass of warm water. Swish and spit.  - Watch your mouth and tongue for white patches. This is a sign of fungal infection, a common side effect of chemotherapy. Be sure to tell your doctor about these patches. Medication/mouthwashes can be prescribed to help you fight the fungal infection.      COVID-19 is a new challenge, but [place] and the [facility] is dedicated to providing you and your loved ones with the best possible cancer care and support in the safest way possible during this time. We made two videos about the ways we are working to keep you safe, such as offering the option to visit your care team over the phone or through a video, as well as support services offered for our patients and their caregivers. If you have any questions about your cancer care, please call your care team.  ??  Video #1: Keeping [facility] Cancer Care patients safe during the COVID-19 crisis  http://go.eabjmlille.com  ??  Video #2: Support for cancer patients and their caregivers during the COVID-19 pandemic  http://go.SecureGap.uy  ??  Video #2: Support for cancer patients and their caregivers during the COVID-19 pandemic  http://go.SecureGap.uy          Follow Up instructions and Outpatient Referrals     Call MD for:  difficulty breathing, headache or visual disturbances      Call MD for:  persistent dizziness or light-headedness      Call MD for:  persistent nausea or vomiting      Call MD for:  redness, tenderness, or signs of infection (pain, swelling, redness, odor or green/yellow discharge around incision site)      Call MD for:  severe uncontrolled pain      Call MD for: Temperature > 38.5 Celsius ( > 101.3 Fahrenheit)      Discharge instructions      It was a pleasure taking care of you!    Diagnosis: Headaches following surgery and ommaya placement    New/Important Medications:  -oxycodone 15mg  every 4 hours as needed for severe pain    We recommend you take a laxative while using oxycodone, as this can cause significant constipation. You can use miralax once daily and increase this to twice daily if you are not having one good bowel movement daily.     Home Health Needs: none    Follow up Appointments:   - radiation oncology on 9/29  - we will reach out to Dr. Delight Stare team to see when they would like to see you next     --------------    When to Call Your [facility] Cancer Care Team:   Monday- Friday from 8:00 am - 5:00 pm: Call [phone] or Toll free [phone].  Ask to speak to the Triage Nurse  On Nights, Weekends and Holidays: Call [phone]. Ask the operator to page the Oncology Fellow on Call     RED ZONE:  Take action now!  You need to be seen right away. Call 911 or go to your nearest hospital for help.   - Symptoms are at a severe level of discomfort    - Bleeding that will not stop  - Chest Pain    - Hard to  breathe    - Fall or passing out    - New Seizure    - Thoughts of hurting yourself or others     YELLOW ZONE: Take action today  This is NOT an all-inclusive list. Pleae call with any new or worsening symptoms.   Call your doctor, nurse or other healthcare provider at [phone]  You can be seen by a provider the same day through our Same Day Acute Care for Patients with Cancer program.   - Symptoms are new or worsening; You are not within your goal range for:    - Pain          - Swelling (leg, arm, abdomen, face, neck)    - Shortness of breath        - Skin rash or skin changes    - Bleeding (nose, urine, stool, wound)    - Wound issues (redness, drainage, re-opened)    - Feeling sick to your stomach and throwing up    - Confusion    - Mouth sores/pain in your mouth or throat     - Vision changes   - Hard stool or very loose stools (increase in ostomy   - Fever >100.4 F, chills     Output)        - Worsening cough with mucus that is green, yellow or bloody   - No urine for 12 hours      - Pain or burning when going to the bathroom    - Feeding tube or other catheter/tube issue     - Home infusion pump issue - call [phone]   - Redness or pain at previous IV or port/catheter site    - Depressed or anxiety     GREEN ZONE: You are in control   Your symptoms are under control. Continue to take your medicine as ordered. Keep all visits to the doctor.   - No increase or worsening symptoms   - Able to take your medicine   - Able to drink and eat     For your safety and best care, please DO NOT use MyChart messages to report red or yellow symptoms.   MyChart messages are only checked during weekday normal business hours and you should receive a   Response within 2 business days.   Please use MyChart only for the follows:   - Non-urgent medication refills, scheduling requests or general questions.           Patient Education:     - Wash your hands routinely with soap and water  - Take your temperature when you have chills or are not feeling well  - Use a soft toothbrush  - Avoid constipation or straining with bowel movements. This may mean you occasionally need to take over-the-counter stool softeners or laxatives.   - Avoid people who have colds or the flu, or are not feeling well.  - Wear a mask when visiting crowded places.  - Maintain a well-balanced diet and eat healthy foods  - Speak with your doctor before having any dental work done  - Do only as much activity as you can tolerate    Other instructions:  - Don't use dental floss if your platelet count is below 50,000. Your doctor or nurse should tell you if this is the case.  - Use any mouthwashes given to you as directed.  - If you can't tolerate regular brushing, use an oral swab (bristle-less) toothbrush, or use salt  and baking soda to clean your mouth. Mix 1 teaspoon of salt and 1 teaspoon of baking soda into an 8-ounce glass of warm water. Swish and spit.  - Watch your mouth and tongue for white patches. This is a sign of fungal infection, a common side effect of chemotherapy. Be sure to tell your doctor about these patches. Medication/mouthwashes can be prescribed to help you fight the fungal infection.      COVID-19 is a new challenge, but [place] and the [facility] is dedicated to providing you and your loved ones with the best possible cancer care and support in the safest way possible during this time. We made two videos about the ways we are working to keep you safe, such as offering the option to visit your care team over the phone or through a video, as well as support services offered for our patients and their caregivers. If you have any questions about your cancer care, please call your care team.  ??  Video #1: Keeping [facility] Cancer Care patients safe during the COVID-19 crisis  http://go.eabjmlille.com  ??  Video #2: Support for cancer patients and their caregivers during the COVID-19 pandemic  http://go.SecureGap.uy  ??  Video #2: Support for cancer patients and their caregivers during the COVID-19 pandemic  http://go.SecureGap.uy          Appointments which have been scheduled for you    Sep 26, 2020 12:15 PM  (Arrive by 12:00 PM)  MRI BRAIN W WO CONTRAST    -UN with IC MRI RM 1  IMG MRI IMAGING CENTER [facility]) [address]  [place] [address]  [phone]   On appt date:  Bring recent lab work  Bring documentation of any metal object implants  Take meds as usual  Check w/physician if diabetic  You will be asked to change into a gown for your safety    On appt date do not:  Consume anything 2 hrs  Wear metallic items including jewelry (we are not responsible for lost items)    Let us know if pt:  Claustrophobic  Metal object implant  Pregnant  Prescribed a sedative  On dialysis  Allergic to MRI dye/contrast  Kidney Failure    (Title:MRIWCNTRST)     Sep 26, 2020  2:00 PM  (Arrive by 1:30 PM)  FOLLOW UP 30 with Colette Buford Dresser, MD  [facility] RADIATION ONCOLOGY [facility] REGION) [address]  [place] [address]  [phone]      Oct 01, 2020  3:40 PM  (Arrive by 3:25 PM)  Danelle Earthly FOLLOW UP with Cruzita Lederer, ANP  [facility] SPINE CTR NEUROSURGERY [facility] [address] [facility] REGION) [address] [place] [address]  [phone]      Oct 04, 2020 12:00 PM  (Arrive by 11:30 AM)  RETURN VIDEO - OTHER with Reed Breech, LCSW  [facility] [address] [facility] CANCER HOSP [facility] REGION) [address]  [place] [address]  [phone]      Nov 12, 2020 10:30 AM  (Arrive by 10:15 AM)  Danelle Earthly FOLLOW UP with Odie Sera, MD  [facility] SPINE CTR NEUROSURGERY [facility] [address]  [facility] REGION) [address] [place] [address]  [phone]           ______________________________________________________________________  Discharge Day Services:  BP 142/91  - Pulse 77  -  Temp 36.7 ??C (Oral)  - Resp 18  - Ht 183.5 cm (6' 0.24)  - Wt 90.4 kg (199 lb 4.8 oz)  - SpO2 98%  - BMI 26.85 kg/m??   Pt seen on the day of discharge and determined appropriate for discharge.    Condition at Discharge: stable    Length of Discharge: I spent less than 30 mins in the discharge of this patient.

## 2020-09-22 NOTE — Unmapped (Signed)
VSS. A&Ox4 with intermittent delayed responses. No signs of trauma or injury. Pain managed with PRN Oxycodone. Up ad lib independently. Will continue to monitor.            Problem: Adult Inpatient Plan of Care  Goal: Plan of Care Review  Outcome: Progressing  Goal: Patient-Specific Goal (Individualized)  Outcome: Progressing  Goal: Absence of Hospital-Acquired Illness or Injury  Outcome: Progressing  Intervention: Identify and Manage Fall Risk  Recent Flowsheet Documentation  Taken 09/21/2020 2000 by Marcelline Mates, RN  Safety Interventions:   low bed   lighting adjusted for tasks/safety   nonskid shoes/slippers when out of bed   room near unit station   fall reduction program maintained  Intervention: Prevent Infection  Recent Flowsheet Documentation  Taken 09/21/2020 2000 by Marcelline Mates, RN  Infection Prevention:   cohorting utilized   rest/sleep promoted   single patient room provided  Goal: Optimal Comfort and Wellbeing  Outcome: Progressing  Goal: Readiness for Transition of Care  Outcome: Progressing  Goal: Rounds/Family Conference  Outcome: Progressing     Problem: Impaired Wound Healing  Goal: Optimal Wound Healing  Outcome: Progressing

## 2020-09-22 NOTE — Unmapped (Signed)
Darrell [name], 34 y.o., male, here for frontal headache pain s/p Omaya placement. This patient is alert, oriented, stable and afebrile. He can be forgetful at times and have trouble with verbal processing. Issues/events for today include the same headache pain. Oxy doesn't seem to help with pain control, however Morphine IV seems to help. Encouraging adequate fluid intake. Ambulates frequently and with ease. MRI completed today. No other acute or significant events today. Pending rad/onc consult. WCTM.       Vitals:    09/21/20 1521   BP: 136/97   Pulse: 97   Resp: 18   Temp: 36.7 ??C (98.1 ??F)   SpO2: 100%      Problem: Adult Inpatient Plan of Care  Goal: Plan of Care Review  Outcome: Progressing  Goal: Patient-Specific Goal (Individualized)  Outcome: Progressing  Goal: Absence of Hospital-Acquired Illness or Injury  Outcome: Progressing  Intervention: Identify and Manage Fall Risk  Recent Flowsheet Documentation  Taken 09/21/2020 1600 by Sarina Ser, RN  Safety Interventions:   bleeding precautions   low bed   neutropenic precautions   seizure precautions  Goal: Optimal Comfort and Wellbeing  Outcome: Progressing  Goal: Readiness for Transition of Care  Outcome: Progressing  Goal: Rounds/Family Conference  Outcome: Progressing     Problem: Adult Inpatient Plan of Care  Goal: Optimal Comfort and Wellbeing  Outcome: Progressing

## 2020-09-22 NOTE — Unmapped (Signed)
NEUROSURGERY CONSULT PROGRESS NOTE    Requesting Service Attending Physician:  Vivien Rossetti  Neurosurgery Attending of Record: Marylouise Stacks, MD     Brief History of Present Illness  Darrell [name] is a 34 y.o. male PMH significant for migraine, anxiety, GBM s/p multiple resections (03/21/20 at [facility] and 06/23/20) who presented for recurrence. Neurologically he has a reassuring exam. MRI brain shows interval enlargement of his right temporal GBM with increased size of the cystic component. This has caused associated mass effect and edema.    Subjective/Interval History  Headaches the same, head pressure improved.     Interval imaging reviewed  MRI brain - reduced cystic component of right sided mass    Assessment and Recommendations  **Headaches s/p Ommaya reservoir placement for interval drainage of cyst  - clinically stable this morning  - no further neurosurgical intervention at this time  - steroids per oncology    Please page the Neurosurgery consult pager at [phone] with questions/concerns.   ___________________________________________________________________    Neurological Exam  EOSp  PERRL  EOMI   F=  BUE 5  BLE 5  SILT  Incision CDI    Problem List  Active Problems:    Glioblastoma (CMS-HCC)  Resolved Problems:    * No resolved hospital problems. *

## 2020-09-22 NOTE — Unmapped (Signed)
Darrell [name], 34 y.o., male here for glioblastoma. This patient is alert, oriented, stable and afebrile, but sometimes forgetful/has poor safety awareness/delayed response. Issues/events for today include headache pain (which has improved since Omaya tap yesterday). Awaiting PO morphine, since pt lost PIV. Adequate fluid intake. Ambulates frequently. Due to go home today.  No other acute or significant events today. WCTM.       Vitals:    09/22/20 0833   BP: 138/99   Pulse: 101   Resp: 18   Temp: 37.1 ??C (98.7 ??F)   SpO2: 98%      Problem: Adult Inpatient Plan of Care  Goal: Plan of Care Review  Outcome: Discharged to Home  Goal: Patient-Specific Goal (Individualized)  Outcome: Discharged to Home  Goal: Absence of Hospital-Acquired Illness or Injury  Outcome: Discharged to Home  Intervention: Identify and Manage Fall Risk  Recent Flowsheet Documentation  Taken 09/22/2020 0730 by Sarina Ser, RN  Safety Interventions:   low bed   bleeding precautions   family at bedside   neutropenic precautions  Goal: Optimal Comfort and Wellbeing  Outcome: Discharged to Home  Goal: Readiness for Transition of Care  Outcome: Discharged to Home  Goal: Rounds/Family Conference  Outcome: Discharged to Home     Problem: Impaired Wound Healing  Goal: Optimal Wound Healing  Outcome: Discharged to Home

## 2020-09-22 NOTE — Unmapped (Cosign Needed)
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 Adriana Simas at [facility],??s/p resection and cyst drainage on 06/22/20 with Dr. Ihor Dow 6000 cGy with Dr. 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     Active Problems:    Glioblastoma (CMS-HCC)  Resolved Problems:    * No resolved hospital problems. *    Headaches in s/o of known glioblastoma   worsening headaches, starting on right side of head, and becomes throbbing and unbearable. S/p Ommaya drainage with worsening of headache with drainage. Neurosurgery no role for surgical intervention given frontal headache and no change in quality s/p tap. Concern that headache is not related increased cyst volume.    - Consulted chronic pain to assist in getting patient on a regimen that will make his pain tolerable    - Dexamethasone 8 mg Q 12 hours    -Oxy 5 mg Q 4 hour / morphine 2mg  Q 4 hour , tylenol 650mg  Q 6    - Neurosurgery : no shunt placement at this time given worsening symptoms after drainage.    Right frontotemporal glioblastoma: s/p gross total resection on 03/21/20 by Dr. Adriana Simas at [facility],??s/p resection and cyst drainage on 06/22/20 with Dr. Ihor Dow 6000 cGy with Dr. 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 Followed by Dr. Octaviano Glow.    - Planned presenting at tumor board 9/24 , If more operative intervention is planned, delay avastin  Per his primary oncologist : second line options - clinical trial ([facility] referral placed) versus initiation of avastin + lomustine 3-4 weeks post Omaya placement per [facility] trial. As he did not complete full temodar dosing, he may have less risk of myelosupression, and may tolerate higher lomustine dose. We will discuss more in the next two weeks      ??  Smoking: Not interested in a patch but is interested in gum  - nicotine gum    Daily Checklist:  Diet: Regular Diet  DVT PPx: SCDs  Electrolytes: No Repletion Needed  Code Status: Full Code    Chief Concern:   No Principal Problem: There is no principal problem currently on the Problem List. Please update the Problem List and refresh.    Subjective:   HPI:  Darrell [name] is presenting for worsening headaches.  At baseline he has dull headaches that are helped with nortriptyline.  He states that headaches are all over.  He also is experiencing sharp frontal headaches that have been worsening over the past few days.  His headaches first began in March when he was diagnosed with brain cancer.  Over these past few days he describes a sharp frontal headache pain is worsening and that it is more constant and that the pain is worse.  He denies anything that triggers it such as light or food and does not feel like it is a migraine pain.  The pain is very intense and does not find that anything is helping.  He states Percocet does not help but Vicodin has helped in the past since it last longer and when getting morphine in the ED it provides some relief.  He had been prescribed Fioricet but states it is not helping either.  He  is unsure if the headache cocktail has helped in the past.  He has some nausea when the headache is hurting but denies vomiting and confirms a good appetite and staying hydrated.  He denies dizziness lightheadedness weakness falls vision changes loss of sensation.  He has mid back pain near his scapula and neck pain.  Denies any fevers.  He eats marijuana Gummies to help him relax.  He drinks a couple of alcoholic beverages once a week.  uses half a pack of cigarettes daily.  ??  His CT head showed enlargement of cystic space, he underwent a reservoir tap with large volume for symptomatic relief in the ED with neurosurgery. Following this his headache has persisted, he states his dull headache improved but the sharp frontal headache is still bothering him.        Allergies:  Patient has no known allergies.    Medications:   Prior to Admission medications    Medication Dose, Route, Frequency   acetaminophen (TYLENOL) 325 MG tablet 650 mg, Oral, Daily PRN   ALPRAZolam (XANAX) 1 MG tablet 1 mg, Oral, 2 times a day PRN   baclofen (LIORESAL) 20 MG tablet 20 mg, Oral, 3 times a day PRN   butalbital-acetaminophen-caffeine (ESGIC) 50-325-40 mg per tablet 1 tablet, Oral, Every 4 hours PRN   cholecalciferol, vitamin D3-50 mcg, 2,000 unit,, (VITAMIN D3) 50 mcg (2,000 unit) cap 50 mcg, Oral, Daily   dexAMETHasone (DECADRON) 4 MG tablet 8 mg, Oral, 2 times a day (standard)   ibuprofen (ADVIL,MOTRIN) 200 MG tablet 600 mg, Oral, Daily PRN   levETIRAcetam (KEPPRA) 1000 MG tablet 1,000 mg, Oral, 2 times a day (standard)   magnesium oxide (MAG-OX) 400 mg (241.3 mg elemental magnesium) tablet 400 mg, Oral, Daily (standard)   nortriptyline (PAMELOR) 50 MG capsule 100 mg, Oral, Daily   ondansetron (ZOFRAN) 8 MG tablet One tab PO 30 minutes prior chemo and q8h PRN nausea   ondansetron (ZOFRAN-ODT) 8 MG disintegrating tablet Dissolve 1 tablet on the tongue 30 minutes before chemo and every 8 hours as needed for nausea.   ostomy supplies (SKIN-PREP SPRAY-ON DRESSING) SprA Apply to clean dry skin before wearing Optune device.   prochlorperazine (COMPAZINE) 10 MG tablet 10 mg, Oral, 3 times a day (AC)   riboflavin, vitamin B2, 100 mg Tab 100 mg, Oral, Daily (standard)   SUMAtriptan (IMITREX) 50 MG tablet 50 mg, Oral, Once as needed, Use only once daily as needed.   temozolomide (TEMODAR) 180 mg capsule Take 1 (180mg ) capsule with 1 (250mg ) capsule by mouth for 5 consecutive days every 28 days. Take 30 minutes after Zofran at bedtime. Total dose = 430mg .   temozolomide (TEMODAR) 250 mg capsule Take 1 (250mg ) capsule with 1 (180mg ) capsule by mouth for 5 consecutive days every 28 days. Take 30 minutes after Zofran at bedtime. Total dose = 430mg .   traZODone (DESYREL) 50 MG tablet TAKE 1/2 TO 1 TABLET(25 TO 50 MG) BY MOUTH EVERY NIGHT AS NEEDED FOR SLEEP       Medical History:  Past Medical History:   Diagnosis Date   ??? Cancer (CMS-HCC)    ??? Migraine        Surgical History:  Past Surgical History:   Procedure Laterality Date   ??? PR BURR HOLE IMPLANT CATH/DEVICE N/A 09/13/2020    Procedure: BURR HOLE(S); FOR IMPLANT VENTRICULAR CATH, RESERV, EEG ELECT, PRESSR RECORD DEVICE, OTHER CEREBRAL MONITOR;  Surgeon: Odie Sera, MD;  Location: MAIN OR [facility];  Service:  Neurosurgery   ??? PR EXCIS SUPRATENT BRAIN TUMOR Right 06/22/2020    Procedure: CRANIECTOMY; EXC BRAIN TUMOR-SUPRATENTORIAL;  Surgeon: Corlis Leak, MD;  Location: MAIN OR [facility];  Service: Neurosurgery   ??? PR INSERT SUBCUT RESERV/PUMP/DEV N/A 09/13/2020    Procedure: INSERTION OF SUBCUTANEOUS RESERVOIR, PUMP OR CONTINOUS INFUSION SYSTEM FOR CONNECTION TO VENTRICULAR CATH;  Surgeon: Odie Sera, MD;  Location: MAIN [facility];  Service: Neurosurgery   ??? PR MICROSURG TECHNIQUES,REQ OPER MICROSCOPE Right 06/22/2020    Procedure: MICROSURGICAL TECHNIQUES, REQUIRING USE OF OPERATING MICROSCOPE (LIST SEPARATELY IN ADDITION TO CODE FOR PRIMARY PROCEDURE);  Surgeon: Corlis Leak, MD;  Location: MAIN [facility];  Service: Neurosurgery   ??? PR STEREOTACTIC COMP ASSIST PROC,CRANIAL,INTRADURAL N/A 06/22/2020    Procedure: STEREOTACTIC COMPUTER-ASSISTED (NAVIGATIONAL) PROCEDURE; CRANIAL, INTRADURAL;  Surgeon: Corlis Leak, MD;  Location: MAIN OR [facility];  Service: Neurosurgery   ??? PR STEREOTACTIC COMP ASSIST PROC,CRANIAL,INTRADURAL N/A 09/13/2020    Procedure: STEREOTACTIC COMPUTER-ASSISTED (NAVIGATIONAL) PROCEDURE; CRANIAL, INTRADURAL;  Surgeon: Odie Sera, MD;  Location: MA[facility] OR [facility];  Service: Neurosurgery       Family History:   Family History   Problem Relation Age of Onset   ??? No Known Problems Mother    ??? No Known Problems Father    ??? No Known Problems Sister    ??? No Known Problems Brother    ??? No Known Problems Maternal Aunt    ??? No Known Problems Maternal Uncle    ??? No Known Problems Paternal Aunt    ??? No Known Problems Paternal Uncle    ??? No Known Problems Maternal Grandmother    ??? No Known Problems Maternal Grandfather    ??? No Known Problems Paternal Grandmother    ??? No Known Problems Paternal Grandfather    ??? No Known Problems Other    ??? Amblyopia Neg Hx    ??? Blindness Neg Hx    ??? Cancer Neg Hx    ??? Cataracts Neg Hx    ??? Diabetes Neg Hx    ??? Glaucoma Neg Hx    ??? Hypertension Neg Hx    ??? Macular degeneration Neg Hx    ??? Retinal detachment Neg Hx    ??? Strabismus Neg Hx    ??? Stroke Neg Hx    ??? Thyroid disease Neg Hx    ??? Anesthesia problems Neg Hx        Social History:  The patient lives with family    Social History     Tobacco Use   ??? Smoking status: Current Every Day Smoker     Packs/day: 0.40     Years: 10.00     Pack years: 4.00     Types: Cigarettes   ??? Smokeless tobacco: Never Used   Vaping Use   ??? Vaping Use: Every day   Substance Use Topics   ??? Alcohol use: Yes   ??? Drug use: Never        Review of Systems:  10 systems were reviewed and are negative unless otherwise mentioned in the HPI    Objective:   Physical Exam:  Temp:  [36.6 ??C-36.8 ??C] 36.7 ??C  Heart Rate:  [76-103] 97  Resp:  [18-20] 18  BP: (136-142)/(90-98) 136/97  SpO2:  [95 %-100 %] 100 %    Gen: WDWN in uncomfortable on edge of bed, answers questions appropriately  Eyes: Sclera anicteric, EOMI, PERRLA,  HENT: prior surgical scars, OP w/o erythema or exudate  Neck: no cervical lymphadenopathy or thyromegaly, no JVD  Heart: RRR, S1, S2, no M/R/G, no chest wall tenderness  Lungs: CTAB, no crackles or wheezes, no use of accessory muscles  Abdomen: Normoactive bowel sounds, soft, NTND, no rebound/guarding, no hepatosplenomegaly  Extremities: no clubbing, cyanosis, or edema: pulses are +2 in bilateral upper and lower extremities  Neuro: CN II-XI grossly intact, normal cerebellar function, normal gait. No focal deficits.  Skin:  No rashes, lesions on clothed exam  Psych: Alert, oriented, normal mood and affect.     Labs/Studies/Imaging:  Labs, Studies, Imaging from the last 24hrs per EMR and personally reviewed

## 2020-09-24 LAB — VITAMIN D, TOTAL (25OH): Lab: 22.6

## 2020-09-24 MED ORDER — TRAZODONE 50 MG TABLET
ORAL_TABLET | 1 refills | 0 days
Start: 2020-09-24 — End: ?

## 2020-09-25 MED ORDER — DOCUSATE SODIUM 100 MG CAPSULE
ORAL_CAPSULE | Freq: Two times a day (BID) | ORAL | 1 refills | 15 days | Status: CP
Start: 2020-09-25 — End: 2021-09-25
  Filled 2020-09-27: qty 30, 15d supply, fill #0

## 2020-09-25 MED ORDER — POLYETHYLENE GLYCOL 3350 17 GRAM/DOSE ORAL POWDER
Freq: Every day | ORAL | 0 refills | 30 days | Status: CP
Start: 2020-09-25 — End: 2020-11-24
  Filled 2020-09-27: qty 510, 30d supply, fill #0

## 2020-09-25 MED ORDER — MORPHINE ER 15 MG TABLET,EXTENDED RELEASE
ORAL_TABLET | Freq: Two times a day (BID) | ORAL | 0 refills | 30 days | Status: CP
Start: 2020-09-25 — End: ?
  Filled 2020-09-27: qty 6, 3d supply, fill #0

## 2020-09-25 NOTE — Unmapped (Signed)
Hi,     Patient contacted the Communication Center regarding the following:    - Returning missed call from Dr.Venepalli. No notes in epic.    Please contact patient at [phone].    Thanks in advance,    Darrell [name]  [facility] Cancer Communication Center   [phone]

## 2020-09-25 NOTE — Unmapped (Signed)
Hi,     Darrell contacted the [employer] requesting to speak with the care team of Darrell [name] to discuss:    Patient called to speak to Dr. Tye [place] about discussion with tumor board.    Please contact Darrell at [phone].        Check Indicates criteria has been reviewed and confirmed with the patient:    [x]  Preferred Name   [x]  DOB and/or MR#  [x]  Preferred Contact Method  [x]  Phone Number(s)   []  MyChart     Thank you,   Darrell [name]  [facility] Cancer Communication Center   [phone]

## 2020-09-25 NOTE — Unmapped (Signed)
Mr. Vanderwoude called to report he was having trouble sleeping. He was recently discharged from the hospital on 09/21/20 after ommaya accessed to drain cyst and pain control. He was discharged on oxycodone and dexamethasone 8 mg BID. He is taking trazodone which helps but he keeps waking  Up in the night and he feels overly sedated the next morning, he is looking for other options.     I reviewed that likely his insomnia wont improve much until his steroids are decreased, he is on a large dose of steroids due to midline shift and evidence of edema on recent MRI. I discussed some options which may help, since he is already taking alprazolam at night. I recommended trying diphenhydramine or melatonin or both. These are unlikely to help in the setting of large doses of steroids but may be beneficial.    He also is awaiting a call from Dr. Tye Maryland to see how to tumor board discussion went and plans for him. He has heard from [facility] about setting up a consultation with Dr. Zachery Conch, but would like to hear from Dr. Tye Maryland about our plan for him. I briefly discussed that we think it is a good idea to be screened for trials at [facility] and we can also discuss the alternative options for second line therapy.     Laverna Peace PharmD, BCOP, CPP  Hematology/Oncology Pharmacist  P: [phone]

## 2020-09-26 NOTE — Unmapped (Signed)
Please refill if appropriate

## 2020-09-26 NOTE — Unmapped (Signed)
I spoke with Darrell [name] around 555pm on 9-28. He reports no change in his headaches, but these have not worsened. He is taking 4-5 oxycodone tablets daily but the pain relief does not last for long.  We discussed the following:    1) results of the tumor board discussion from Friday: the group recommended to proceed with second line chemotherapy rather than surgery or radiation.  2) I discussed the results of the avastin+lomustine versus lomustine phase 3 data, and I would recommend starting C1 with lomustine alone in case additional neurosurgical intervention is needed  3) Addition of MSSR 15mg  PO BID to his pain regimen  4) Purcell Mouton will call him tomorrow with regards to additional counseling re constipation reduction and his current regimen  5) I will reach out to [facility] as he has not heard back, with the goal of a visit this coming week or next week for second line opinion and clinical trial consideration  6) Florentina Addison will reach out tomorrow with chemotherapy counseling  7) Video visit with me next Monday to follow up on symptoms

## 2020-09-27 MED FILL — POLYETHYLENE GLYCOL 3350 17 GRAM/DOSE ORAL POWDER: 30 days supply | Qty: 510 | Fill #0 | Status: AC

## 2020-09-27 MED FILL — DOCUSATE SODIUM 100 MG CAPSULE: 15 days supply | Qty: 30 | Fill #0 | Status: AC

## 2020-09-27 MED FILL — MORPHINE ER 15 MG TABLET,EXTENDED RELEASE: 3 days supply | Qty: 6 | Fill #0 | Status: AC

## 2020-09-28 NOTE — Unmapped (Signed)
Called Darrell [name]. I reiterated the plan to him about [facility] referral, he said he is filling out new patient paperwork today. I let him know to call us if they need anything. Also let him know that if he for some reason cannot go on clinical trial he can come back to [place] for second line therapy which would include bevacizumab and lomustine. He was clear on the plan and will let us know when their visit with [facility] gets set up. He was thankful for the call.     Laverna Peace PharmD, BCOP, CPP  Hematology/Oncology Pharmacist  P: [phone]

## 2020-09-28 NOTE — Unmapped (Signed)
Called and spoke to Mr. Darrell [name] is response to his mychart message about restarting temozolomide. I reiterated that his brain tumor has progressed and that our first option for him is to go to clinical trial at [facility] as second line therapy. After evaluation if he is not a candidate we will treat him with second line therapy plan for bevacizumab and lomustine.     He let me know his brother was turning in some paperwork to [facility] today and then will have his appointment set up. I instructed Mr. Darrell [name] to let us know when his appointment is so we make sure he has follow up in place.     He also mentioned the oxycodone is working well for his headaches and needs a refill to keep his headaches under control.     Laverna Peace PharmD, BCOP, CPP  Hematology/Oncology Pharmacist  P: [phone]

## 2020-10-01 ENCOUNTER — Encounter: Admit: 2020-10-01 | Discharge: 2020-10-02 | Payer: PRIVATE HEALTH INSURANCE

## 2020-10-01 DIAGNOSIS — Z5189 Encounter for other specified aftercare: Principal | ICD-10-CM

## 2020-10-01 MED ORDER — OXYCODONE 15 MG TABLET
ORAL_TABLET | ORAL | 0 refills | 20 days | Status: CP | PRN
Start: 2020-10-01 — End: 2020-10-31

## 2020-10-01 NOTE — Unmapped (Signed)
Neurosurgery Clinic Note - Post-Op    Assessment/Recommendations:    S/p placement right Ommaya reservoir (9/16) PMH GBM multiple resections (03/01/20 at [facility], 06/23/20- Dr. Laqueta Jean)    Continue wound care protocol, ok to advance activities to as tolerated without restrictions  Continue oxycodone for pain /HA per oncology  No further neurosurgical follow-up anticipated. Patient plans to transfer care to [facility] for possible clinical trial.    Interval History    Darrell [name] is a 34 y.o. male who returns to clinic today for outpatient follow-up approx 3 weeks following placemen of right Ommaya reservoir. He is accompanied by family. 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 He ultimately underwent placement of an Ommaya as a treatment strategy for drainage of recurrent fluid. Postoperatively he has some initial decrease in his HAs and feeling of pressure in his head but contacted [place] back about 5 days after his Ommaya was placed regarding recurrent symptoms. He presented back to [facility] ED on 9/24 and Ommaya was tapped and fluis drained. Patient reported continued HA despite decrease in cystic cavity on Head CT. He is here today for a final wound check and follow-up after Ommaya reservoir. He continues to experience daily headaches.  He reports that MS Contin is not very effective for his headaches.  He has been taking oxycodone 15 mg which seems to work best.  He denies any nausea or vomiting.     Allergies    Patient has no known allergies.    Medications      Current Outpatient Medications   Medication Sig Dispense Refill   ??? acetaminophen (TYLENOL) 325 MG tablet Take 650 mg by mouth daily as needed for pain.     ??? ALPRAZolam (XANAX) 1 MG tablet Take 1 mg by mouth two (2) times a day as needed.      ??? cholecalciferol, vitamin D3-50 mcg, 2,000 unit,, (VITAMIN D3) 50 mcg (2,000 unit) cap Take 50 mcg by mouth daily.     ??? dexAMETHasone (DECADRON) 4 MG tablet Take 2 tablets (8 mg total) by mouth Two (2) times a day. 120 tablet 0   ??? docusate sodium (COLACE) 100 MG capsule Take 1 capsule (100 mg total) by mouth two (2) times a day. 30 capsule 1   ??? dronabinoL (MARINOL) 10 MG capsule Take 1 capsule (10 mg total) by mouth Three (3) times a day before meals.  0   ??? levETIRAcetam (KEPPRA) 1000 MG tablet Take 1 tablet (1,000 mg total) by mouth Two (2) times a day. 180 tablet 3   ??? nortriptyline (PAMELOR) 50 MG capsule Take 2 capsules (100 mg total) by mouth every morning before breakfast. 60 capsule 0   ??? ondansetron (ZOFRAN-ODT) 8 MG disintegrating tablet Dissolve 1 tablet on the tongue 30 minutes before chemo and every 8 hours as needed for nausea. 60 tablet 10   ??? ostomy supplies (SKIN-PREP SPRAY-ON DRESSING) SprA Apply to clean dry skin before wearing Optune device. 118 mL 0   ??? polyethylene glycol (GLYCOLAX) 17 gram/dose powder Mix  17 g  (1 capful) by mouth daily in 4 to 8 ounces of water, soda, juice, or tea 510 g 0   ??? prochlorperazine (COMPAZINE) 10 MG tablet Take 1 tablet (10 mg total) by mouth Three (3) times a day before meals. 90 tablet 3   ??? riboflavin, vitamin B2, 100 mg Tab Take 1 tablet (100  mg total) by mouth daily. 90 tablet 3   ??? traZODone (DESYREL) 50 MG tablet TAKE 1/2 TO 1 TABLET(25 TO 50 MG) BY MOUTH EVERY NIGHT AS NEEDED FOR SLEEP 30 tablet 1   ??? baclofen (LIORESAL) 20 MG tablet Take 20 mg by mouth Three (3) times a day as needed.  (Patient not taking: Reported on 10/01/2020)     ??? butalbital-acetaminophen-caffeine (ESGIC) 50-325-40 mg per tablet Take 1 tablet by mouth every four (4) hours as needed for pain. (Patient not taking: Reported on 10/01/2020) 120 tablet 3   ??? lidocaine (LMX) 4 % cream Apply topically Three (3) times a day as needed (Pain). (Patient not taking: Reported on 10/01/2020) 30 g 2   ??? magnesium oxide (MAG-OX) 400 mg (241.3 mg elemental magnesium) tablet Take 1 tablet (400 mg total) by mouth daily. (Patient not taking: Reported on 10/01/2020) 200 tablet 1   ??? MORPhine (MS CONTIN) 15 MG 12 hr tablet Take 1 tablet (15 mg total) by mouth Two (2) times a day. (Patient not taking: Reported on 10/01/2020) 60 tablet 0   ??? oxyCODONE (ROXICODONE) 15 MG immediate release tablet Take 1 tablet (15 mg total) by mouth every four (4) hours as needed for pain. (Patient not taking: Reported on 10/01/2020) 120 tablet 0   ??? SUMAtriptan (IMITREX) 50 MG tablet Take 1 tablet (50 mg total) by mouth once as needed for migraine. Use only once daily as needed. (Patient not taking: Reported on 10/01/2020) 30 tablet 2   ??? temozolomide (TEMODAR) 180 mg capsule Take 1 (180mg ) capsule with 1 (250mg ) capsule by mouth for 5 consecutive days every 28 days. Take 30 minutes after Zofran at bedtime. Total dose = 430mg . (Patient not taking: Reported on 10/01/2020) 5 capsule 1   ??? temozolomide (TEMODAR) 250 mg capsule Take 1 (250mg ) capsule with 1 (180mg ) capsule by mouth for 5 consecutive days every 28 days. Take 30 minutes after Zofran at bedtime. Total dose = 430mg . (Patient not taking: Reported on 10/01/2020) 5 capsule 1     No current facility-administered medications for this visit.       Objective     Physical Exam    Vital Signs: Reviewed in Epic.  Hemodynamically stable.  General: Appears comfortable. No obvious distress.  Integument: No obvious rashes or ecchymoses.  Extremities: Warm and well-perfused. No cyanosis, clubbing, or edema.  Musculoskeletal: Grossly normal range of motion in all joints.  Neurologic: cranial incision well healed  Awake, alert  Oriented to name, location, time  Speech fluent, naming and repetition intact  Pupils equal and reactive bilaterally  Extraocular movements intact bilaterally  Facial sensation intact V1-V3 bilaterally.  Face symmetric.  Tongue protrudes in the midline.  Full shrug bilaterally.  No pronator drift.  Finger to nose testing reveals no ataxia or dysmetria.  5/5 x 4  Sensation to light touch intact throughout.

## 2020-10-01 NOTE — Unmapped (Signed)
Called Smitty Ackerley (Chads brother) to see if he has heard from [facility] about appointment. He says he has not heard from them yet today. I will let Dr. Tye Maryland know.    Laverna Peace PharmD, BCOP, CPP  Hematology/Oncology Pharmacist  P: [phone]

## 2020-10-02 DIAGNOSIS — C719 Malignant neoplasm of brain, unspecified: Principal | ICD-10-CM

## 2020-10-03 NOTE — Unmapped (Signed)
Spoke with brother.  Unable to get a hold of Darrell [name].    Brother states he's been unable to sleep lately.  I explained this is due to the steroid.  We are going to be sending a calendar to taper down on his dexamethasone.    Brother also states that Darrell [name] mentioned an appointment at [facility] on 10/20.  He will confirm later today.    Brother and Darrell [name] have our contact information if they have any other issues.

## 2020-10-04 ENCOUNTER — Ambulatory Visit: Admit: 2020-10-04 | Payer: PRIVATE HEALTH INSURANCE | Attending: Clinical | Primary: Clinical

## 2020-10-04 DIAGNOSIS — C719 Malignant neoplasm of brain, unspecified: Principal | ICD-10-CM

## 2020-10-04 MED ORDER — DEXAMETHASONE 1 MG TABLET
ORAL_TABLET | 0 refills | 0 days | Status: CP
Start: 2020-10-04 — End: ?

## 2020-10-05 NOTE — Unmapped (Signed)
Brother answered phone.    Darrell [name] seemed to be doing a little better earlier today, but when he tried to give another dose of steroids after his nap Darrell [name] seemed out of it.    Brother stated he was not making any sense.  They decided to take him to [facility] ER.      I agree with that choice.  I did assure him if Darrell [name] needed anything in the future they can certainly reach out to [place].  I wish them the best and hope [facility] can give [place] an answer.

## 2020-10-05 NOTE — Unmapped (Signed)
Reuel Boom (Chads brother) sent a message about [place] having some weakness and joint pains and this morning reported some nausea. I suspect some of these symptoms are related to our attempt to reduce his steroid load and he is having some adrenal insufficieny symptoms. He reports [place] started decreasing his steroids from 8 mg BID to 4 mg BID, but some of his symptoms proceeded the dose change. I instructed Reuel Boom to increase his morning dose back to 8 mg and take 4 mg early afternoon. If there is no improvement in his symptoms report to ED. He was planning to take [place] to the [facility] ED later today if no improvement.    Laverna Peace PharmD, BCOP, CPP  Hematology/Oncology Pharmacist  P: [phone]

## 2020-10-08 NOTE — Unmapped (Signed)
LV with call back number.

## 2020-10-11 NOTE — Unmapped (Signed)
Mr. Pooler called, he was wondering if he needed to keep his future appointment with Dr. Wende Mott and Crisoforo Oxford. He had an admission at [facility] where they tapped his ommaya and he returned to baseline. Then followed up with [facility] neurosurgery after admission and they removed more fluid. He has his Neuro-oncology appointment next Wednesday to discuss clinical trial vs second line treatment options for his progressive GBM. I let him know I would send messages to the providers to ensure they don't need follow up at this time.     Amalia Greenhouse PharmD, [place], CPP  Hematology/Oncology Pharmacist  P: [phone]

## 2020-10-16 NOTE — Unmapped (Signed)
Patient called to see if why his appointments were canceled at [facility].    I explained that insurance is not going to pay for him to get care at [facility] and [place].  Dr. Flonnie Hailstone and Dr. Lynwood Dawley agreed they do not need follow up at [facility] either.    Darrell [name] understands and will follow up at [facility] for questions about appointments.

## 2020-10-26 NOTE — Unmapped (Signed)
This patient has been disenrolled from the [facility] specialty pharmacy services due to Patient transitioned care to [facility].   Currently not taking TMZ.Darrell [name]  [facility] Special[facility] Pharmacist

## 2020-10-27 MED ORDER — DRONABINOL 10 MG CAPSULE
ORAL_CAPSULE | Freq: Three times a day (TID) | ORAL | 0 refills | 0.00000 days | Status: CP
Start: 2020-10-27 — End: ?

## 2020-11-26 ENCOUNTER — Ambulatory Visit: Admit: 2020-11-26 | Payer: PRIVATE HEALTH INSURANCE | Attending: Radiation Oncology | Primary: Radiation Oncology

## 2020-12-29 DEATH — deceased
